# Patient Record
Sex: Male | Born: 1935 | Race: White | Hispanic: No | Smoking: Former smoker
Health system: Southern US, Community
[De-identification: ages and names within clinical notes are randomized; demographics above are authoritative.]

## PROBLEM LIST (undated history)

## (undated) DIAGNOSIS — C4490 Unspecified malignant neoplasm of skin, unspecified: Secondary | ICD-10-CM

## (undated) DIAGNOSIS — D689 Coagulation defect, unspecified: Secondary | ICD-10-CM

## (undated) DIAGNOSIS — F0390 Unspecified dementia without behavioral disturbance: Secondary | ICD-10-CM

## (undated) DIAGNOSIS — K219 Gastro-esophageal reflux disease without esophagitis: Secondary | ICD-10-CM

## (undated) DIAGNOSIS — H9190 Unspecified hearing loss, unspecified ear: Secondary | ICD-10-CM

## (undated) DIAGNOSIS — G20 Parkinson's disease: Secondary | ICD-10-CM

## (undated) DIAGNOSIS — Z5189 Encounter for other specified aftercare: Secondary | ICD-10-CM

## (undated) DIAGNOSIS — G20A1 Parkinson's disease without dyskinesia, without mention of fluctuations: Secondary | ICD-10-CM

## (undated) DIAGNOSIS — M1990 Unspecified osteoarthritis, unspecified site: Secondary | ICD-10-CM

## (undated) DIAGNOSIS — C801 Malignant (primary) neoplasm, unspecified: Secondary | ICD-10-CM

## (undated) DIAGNOSIS — IMO0001 Reserved for inherently not codable concepts without codable children: Secondary | ICD-10-CM

## (undated) DIAGNOSIS — I10 Essential (primary) hypertension: Secondary | ICD-10-CM

## (undated) DIAGNOSIS — I2510 Atherosclerotic heart disease of native coronary artery without angina pectoris: Secondary | ICD-10-CM

## (undated) DIAGNOSIS — J449 Chronic obstructive pulmonary disease, unspecified: Secondary | ICD-10-CM

## (undated) DIAGNOSIS — E7800 Pure hypercholesterolemia, unspecified: Secondary | ICD-10-CM

## (undated) DIAGNOSIS — N189 Chronic kidney disease, unspecified: Secondary | ICD-10-CM

## (undated) DIAGNOSIS — I209 Angina pectoris, unspecified: Secondary | ICD-10-CM

## (undated) DIAGNOSIS — I219 Acute myocardial infarction, unspecified: Secondary | ICD-10-CM

## (undated) HISTORY — PX: COLON SURGERY: SHX602

## (undated) HISTORY — PX: CARDIAC SURGERY: SHX584

## (undated) HISTORY — PX: CHOLECYSTECTOMY: SHX55

## (undated) HISTORY — PX: BACK SURGERY: SHX140

## (undated) HISTORY — PX: CORONARY ARTERY BYPASS GRAFT: SHX141

---

## 1997-10-02 ENCOUNTER — Ambulatory Visit (HOSPITAL_BASED_OUTPATIENT_CLINIC_OR_DEPARTMENT_OTHER): Admission: RE | Admit: 1997-10-02 | Discharge: 1997-10-02 | Payer: Self-pay | Admitting: Plastic Surgery

## 1997-12-11 ENCOUNTER — Ambulatory Visit (HOSPITAL_COMMUNITY): Admission: RE | Admit: 1997-12-11 | Discharge: 1997-12-11 | Payer: Self-pay | Admitting: Surgery

## 1998-05-07 ENCOUNTER — Ambulatory Visit (HOSPITAL_BASED_OUTPATIENT_CLINIC_OR_DEPARTMENT_OTHER): Admission: RE | Admit: 1998-05-07 | Discharge: 1998-05-07 | Payer: Self-pay | Admitting: Plastic Surgery

## 1998-05-11 ENCOUNTER — Ambulatory Visit (HOSPITAL_BASED_OUTPATIENT_CLINIC_OR_DEPARTMENT_OTHER): Admission: RE | Admit: 1998-05-11 | Discharge: 1998-05-11 | Payer: Self-pay | Admitting: Plastic Surgery

## 1998-08-07 ENCOUNTER — Encounter: Payer: Self-pay | Admitting: Cardiology

## 1998-08-07 ENCOUNTER — Ambulatory Visit (HOSPITAL_COMMUNITY): Admission: RE | Admit: 1998-08-07 | Discharge: 1998-08-07 | Payer: Self-pay | Admitting: Cardiology

## 2002-05-01 ENCOUNTER — Ambulatory Visit (HOSPITAL_COMMUNITY): Admission: RE | Admit: 2002-05-01 | Discharge: 2002-05-01 | Payer: Self-pay | Admitting: Orthopaedic Surgery

## 2002-05-01 ENCOUNTER — Encounter: Payer: Self-pay | Admitting: Orthopaedic Surgery

## 2002-05-20 ENCOUNTER — Encounter: Admission: RE | Admit: 2002-05-20 | Discharge: 2002-05-20 | Payer: Self-pay | Admitting: Orthopaedic Surgery

## 2002-05-20 ENCOUNTER — Encounter: Payer: Self-pay | Admitting: Orthopaedic Surgery

## 2002-06-03 ENCOUNTER — Encounter: Payer: Self-pay | Admitting: Orthopaedic Surgery

## 2002-06-03 ENCOUNTER — Encounter: Admission: RE | Admit: 2002-06-03 | Discharge: 2002-06-03 | Payer: Self-pay | Admitting: Orthopaedic Surgery

## 2002-06-17 ENCOUNTER — Encounter: Admission: RE | Admit: 2002-06-17 | Discharge: 2002-06-17 | Payer: Self-pay | Admitting: Orthopaedic Surgery

## 2002-06-17 ENCOUNTER — Encounter: Payer: Self-pay | Admitting: Orthopaedic Surgery

## 2003-01-30 ENCOUNTER — Encounter: Payer: Self-pay | Admitting: Radiology

## 2003-01-30 ENCOUNTER — Encounter: Payer: Self-pay | Admitting: Orthopaedic Surgery

## 2003-01-30 ENCOUNTER — Encounter: Admission: RE | Admit: 2003-01-30 | Discharge: 2003-01-30 | Payer: Self-pay | Admitting: Orthopaedic Surgery

## 2003-02-13 ENCOUNTER — Encounter: Payer: Self-pay | Admitting: Orthopaedic Surgery

## 2003-02-13 ENCOUNTER — Encounter: Admission: RE | Admit: 2003-02-13 | Discharge: 2003-02-13 | Payer: Self-pay | Admitting: Orthopaedic Surgery

## 2003-02-28 ENCOUNTER — Encounter: Payer: Self-pay | Admitting: Orthopaedic Surgery

## 2003-02-28 ENCOUNTER — Encounter: Admission: RE | Admit: 2003-02-28 | Discharge: 2003-02-28 | Payer: Self-pay | Admitting: Orthopaedic Surgery

## 2003-04-03 ENCOUNTER — Encounter: Payer: Self-pay | Admitting: Orthopaedic Surgery

## 2003-04-03 ENCOUNTER — Ambulatory Visit (HOSPITAL_COMMUNITY): Admission: RE | Admit: 2003-04-03 | Discharge: 2003-04-03 | Payer: Self-pay | Admitting: Orthopaedic Surgery

## 2003-04-26 ENCOUNTER — Ambulatory Visit (HOSPITAL_COMMUNITY): Admission: RE | Admit: 2003-04-26 | Discharge: 2003-04-26 | Payer: Self-pay | Admitting: *Deleted

## 2003-08-10 ENCOUNTER — Ambulatory Visit (HOSPITAL_COMMUNITY): Admission: RE | Admit: 2003-08-10 | Discharge: 2003-08-10 | Payer: Self-pay | Admitting: Neurosurgery

## 2003-08-31 ENCOUNTER — Inpatient Hospital Stay (HOSPITAL_COMMUNITY): Admission: RE | Admit: 2003-08-31 | Discharge: 2003-09-01 | Payer: Self-pay | Admitting: Neurosurgery

## 2003-09-03 ENCOUNTER — Emergency Department (HOSPITAL_COMMUNITY): Admission: EM | Admit: 2003-09-03 | Discharge: 2003-09-03 | Payer: Self-pay | Admitting: Emergency Medicine

## 2004-02-27 ENCOUNTER — Ambulatory Visit (HOSPITAL_COMMUNITY): Admission: RE | Admit: 2004-02-27 | Discharge: 2004-02-27 | Payer: Self-pay | Admitting: Cardiology

## 2004-03-19 ENCOUNTER — Ambulatory Visit (HOSPITAL_COMMUNITY): Admission: RE | Admit: 2004-03-19 | Discharge: 2004-03-20 | Payer: Self-pay | Admitting: Cardiology

## 2004-12-11 ENCOUNTER — Ambulatory Visit (HOSPITAL_COMMUNITY): Admission: RE | Admit: 2004-12-11 | Discharge: 2004-12-11 | Payer: Self-pay | Admitting: Neurosurgery

## 2005-01-17 ENCOUNTER — Inpatient Hospital Stay (HOSPITAL_COMMUNITY): Admission: RE | Admit: 2005-01-17 | Discharge: 2005-01-18 | Payer: Self-pay | Admitting: Neurosurgery

## 2005-05-06 ENCOUNTER — Ambulatory Visit (HOSPITAL_COMMUNITY): Admission: RE | Admit: 2005-05-06 | Discharge: 2005-05-06 | Payer: Self-pay | Admitting: Cardiology

## 2007-09-15 ENCOUNTER — Inpatient Hospital Stay (HOSPITAL_COMMUNITY): Admission: RE | Admit: 2007-09-15 | Discharge: 2007-09-16 | Payer: Self-pay | Admitting: *Deleted

## 2008-06-05 ENCOUNTER — Ambulatory Visit (HOSPITAL_COMMUNITY): Admission: RE | Admit: 2008-06-05 | Discharge: 2008-06-05 | Payer: Self-pay | Admitting: *Deleted

## 2008-06-05 ENCOUNTER — Encounter (INDEPENDENT_AMBULATORY_CARE_PROVIDER_SITE_OTHER): Payer: Self-pay | Admitting: *Deleted

## 2008-12-07 ENCOUNTER — Inpatient Hospital Stay (HOSPITAL_COMMUNITY): Admission: RE | Admit: 2008-12-07 | Discharge: 2008-12-08 | Payer: Self-pay | Admitting: Cardiology

## 2009-02-05 ENCOUNTER — Inpatient Hospital Stay (HOSPITAL_COMMUNITY): Admission: RE | Admit: 2009-02-05 | Discharge: 2009-02-07 | Payer: Self-pay | Admitting: Cardiology

## 2009-07-13 ENCOUNTER — Encounter: Admission: RE | Admit: 2009-07-13 | Discharge: 2009-07-13 | Payer: Self-pay | Admitting: Internal Medicine

## 2009-08-04 ENCOUNTER — Encounter: Admission: RE | Admit: 2009-08-04 | Discharge: 2009-08-04 | Payer: Self-pay | Admitting: *Deleted

## 2010-07-14 ENCOUNTER — Encounter: Payer: Self-pay | Admitting: Orthopedic Surgery

## 2010-09-28 LAB — GLUCOSE, CAPILLARY
Glucose-Capillary: 111 mg/dL — ABNORMAL HIGH (ref 70–99)
Glucose-Capillary: 147 mg/dL — ABNORMAL HIGH (ref 70–99)
Glucose-Capillary: 92 mg/dL (ref 70–99)
Glucose-Capillary: 98 mg/dL (ref 70–99)

## 2010-09-28 LAB — CARDIAC PANEL(CRET KIN+CKTOT+MB+TROPI)
CK, MB: 4.4 ng/mL — ABNORMAL HIGH (ref 0.3–4.0)
CK, MB: 8.6 ng/mL — ABNORMAL HIGH (ref 0.3–4.0)
Relative Index: INVALID (ref 0.0–2.5)
Total CK: 98 U/L (ref 7–232)
Troponin I: 1.92 ng/mL (ref 0.00–0.06)

## 2010-09-28 LAB — BASIC METABOLIC PANEL
GFR calc Af Amer: >60 mL/min (ref >60)
GFR calc non Af Amer: 55 mL/min — ABNORMAL LOW (ref >60)
Potassium: 4.4 mEq/L (ref 3.5–5.1)
Sodium: 141 mEq/L (ref 135–145)

## 2010-09-28 LAB — CBC
HCT: 38.5 % — ABNORMAL LOW (ref 39.0–52.0)
Platelets: 112 10*3/uL — ABNORMAL LOW (ref 150–400)
RDW: 14.1 % (ref 11.5–15.5)

## 2010-09-30 LAB — CBC
Hemoglobin: 12.1 g/dL — ABNORMAL LOW (ref 13.0–17.0)
MCHC: 33.9 g/dL (ref 30.0–36.0)
MCV: 94.8 fL (ref 78.0–100.0)
Platelets: 145 10*3/uL — ABNORMAL LOW (ref 150–400)
RBC: 3.75 MIL/uL — ABNORMAL LOW (ref 4.22–5.81)
RDW: 15 % (ref 11.5–15.5)
WBC: 7.9 10*3/uL (ref 4.0–10.5)

## 2010-09-30 LAB — URINALYSIS, ROUTINE W REFLEX MICROSCOPIC
Ketones, ur: NEGATIVE mg/dL
Nitrite: NEGATIVE
Specific Gravity, Urine: 1.02 (ref 1.005–1.030)
pH: 5.5 (ref 5.0–8.0)

## 2010-09-30 LAB — BASIC METABOLIC PANEL
CO2: 27 mEq/L (ref 19–32)
Chloride: 108 mEq/L (ref 96–112)
Chloride: 110 mEq/L (ref 96–112)
Creatinine, Ser: 1.23 mg/dL (ref 0.4–1.5)
Creatinine, Ser: 1.51 mg/dL — ABNORMAL HIGH (ref 0.4–1.5)
GFR calc Af Amer: 54 mL/min — ABNORMAL LOW (ref >60)
GFR calc Af Amer: >60 mL/min (ref >60)
Potassium: 4.5 mEq/L (ref 3.5–5.1)
Sodium: 140 mEq/L (ref 135–145)
Sodium: 140 mEq/L (ref 135–145)

## 2010-09-30 LAB — DIFFERENTIAL
Basophils Absolute: 0 10*3/uL (ref 0.0–0.1)
Lymphocytes Relative: 16 % (ref 12–46)
Neutro Abs: 5.9 10*3/uL (ref 1.7–7.7)
Neutrophils Relative %: 76 % (ref 43–77)

## 2010-09-30 LAB — GLUCOSE, CAPILLARY: Glucose-Capillary: 88 mg/dL (ref 70–99)

## 2010-09-30 LAB — CARDIAC PANEL(CRET KIN+CKTOT+MB+TROPI): Relative Index: INVALID (ref 0.0–2.5)

## 2010-10-07 ENCOUNTER — Emergency Department (INDEPENDENT_AMBULATORY_CARE_PROVIDER_SITE_OTHER): Payer: Medicare Other

## 2010-10-07 ENCOUNTER — Emergency Department (HOSPITAL_BASED_OUTPATIENT_CLINIC_OR_DEPARTMENT_OTHER)
Admission: EM | Admit: 2010-10-07 | Discharge: 2010-10-07 | Disposition: A | Discharge status: Home / Self Care | Payer: Medicare Other | Attending: Emergency Medicine | Admitting: Emergency Medicine

## 2010-10-07 DIAGNOSIS — J449 Chronic obstructive pulmonary disease, unspecified: Secondary | ICD-10-CM | POA: Insufficient documentation

## 2010-10-07 DIAGNOSIS — S4350XA Sprain of unspecified acromioclavicular joint, initial encounter: Secondary | ICD-10-CM | POA: Insufficient documentation

## 2010-10-07 DIAGNOSIS — K219 Gastro-esophageal reflux disease without esophagitis: Secondary | ICD-10-CM | POA: Insufficient documentation

## 2010-10-07 DIAGNOSIS — I10 Essential (primary) hypertension: Secondary | ICD-10-CM | POA: Insufficient documentation

## 2010-10-07 DIAGNOSIS — W06XXXA Fall from bed, initial encounter: Secondary | ICD-10-CM | POA: Insufficient documentation

## 2010-10-07 DIAGNOSIS — Z79899 Other long term (current) drug therapy: Secondary | ICD-10-CM | POA: Insufficient documentation

## 2010-10-07 DIAGNOSIS — E119 Type 2 diabetes mellitus without complications: Secondary | ICD-10-CM | POA: Insufficient documentation

## 2010-10-07 DIAGNOSIS — J4489 Other specified chronic obstructive pulmonary disease: Secondary | ICD-10-CM | POA: Insufficient documentation

## 2010-10-07 DIAGNOSIS — S43109A Unspecified dislocation of unspecified acromioclavicular joint, initial encounter: Secondary | ICD-10-CM

## 2010-10-07 DIAGNOSIS — Z951 Presence of aortocoronary bypass graft: Secondary | ICD-10-CM | POA: Insufficient documentation

## 2010-10-07 LAB — GLUCOSE, CAPILLARY: Glucose-Capillary: 186 mg/dL — ABNORMAL HIGH (ref 70–99)

## 2010-11-05 NOTE — Cardiovascular Report (Signed)
NAME:  Leon Sharp, Leon Sharp NO.:  [id number]   MEDICAL RECORD NO.:  [id number]          PATIENT TYPE:  INP   LOCATION:  6532                         FACILITY:  [facility]   PHYSICIAN:  Darlin Priestly, MD  DATE OF BIRTH:  [birth year]   DATE OF PROCEDURE:  09/15/2007  DATE OF DISCHARGE:                            CARDIAC CATHETERIZATION   PROCEDURE:  1. SVG to diagonal.  2. Percutaneous transluminal coronary angioplasty.  3. Placement of intracoronary stent.  4. SVG to RCA.  5. Placement of intracoronary stent.   ATTENDING PHYSICIAN:  Darlin Priestly, MD   COMPLICATIONS:  None.   INDICATIONS:  Leon Sharp is an 77 year old male patient of Dr. Charolette Child with a history of coronary artery bypass surgery x2 with  multiple percutaneous interventions since that time.  He recently  underwent cardiac catheterization by Dr. Aleen Campi on September 13, 2007,  after the patient had an abnormal stress test, complained of increasing  chest pain.  Cardiac catheterization revealed significant lesion in the  vein graft to the diagonal as well as a napkin ring lesion in the vein  graft to the RCA.  He is now referred for percutaneous intervention of  both vein grafts.   DESCRIPTION OF OPERATION:  After giving informed consent the patient was  brought to the cardiac cath lab where the right groin was shaved,  prepped and draped in the usual sterile fashion.  Routine monitoring was  established.  Using modified Seldinger technique a #6 arterial sheath  was inserted in the left femoral artery.  Next a #6 French LCB guiding  catheter with side holes was then coaxially engaged from the vein graft  to the diagonal.  Following this a 0.014 ATW marker wire was advanced  down the guiding catheter and used to cross the 99% mid graft stenotic  lesion and positioned distal diagonal without difficulty.  Following  this a Firestar 2.5 x 15 mm balloon was then used to cross the stenotic  lesion.   One inflation to 8 atmospheres was performed for a total of 14  seconds.  Followup angiogram revealed good luminal gain.  This balloon  was then removed and a Cypher 2.5 x 18 mm stent was then positioned  across the stenotic lesion.  The stent was then deployed to 16  atmospheres for a total of 18 seconds.  This stent balloon was removed  and a Durastar 2.75 x 10 mm balloon was then used to post dilatate the  stent.  Three overlapping inflations to a maximum of 18 atmospheres for  a total of approximately 40 seconds was performed.  Followup angiogram  revealed no evidence of dissection or thrombus with TIMI III flow in  this vessel.  The balloon and wires were then removed and this guide was  exchanged for an RCB guiding catheter with side holes.  The same 0.014  ATW marker wire was advanced down the guiding catheter and used to cross  the mid vein graft stenotic lesion and positioned in the distal PDA  without difficulty.  Over this a Cypher 3.5 x 23 mm stent  was then used  to cross the stenotic lesion and positioned in the distal portion of the  previously placed stent.  This stent was deployed to 12 atmospheres for  a total of 13 seconds.  One overlapping inflation was then performed for  12 atmospheres for a total of 17 seconds.  Followup angiogram revealed  good luminal gain with no evidence of dissection or thrombus.  The stent  balloon was removed and a Durastar 3.5 x 15 mm balloon was then  positioned in the distal portion of the stent.  Two overlapping  inflations to a maximum of 16 atmospheres were performed for a total of  26 seconds.  Followup angiogram revealed no evidence of dissection or  thrombus with TIMI III flow to the distal vessel.  IV Angiomax was used  throughout the case.   Final orthogonal angiograms revealed less than 10% residual stenosis in  both the vein graft to the diagonal as well as the vein graft to the  RCA.  At this point we elected to conclude the  procedure.  All balloons  and wires and catheters were removed.  Hemostatic sheath was sewn in  place and the patient was transferred back to the ward in stable  condition.   CONCLUSIONS:  1. Successful percutaneous transluminal coronary balloon angioplasty      and placement of a Cypher 2.5 x 18 mm stent ultimately post dilated      to 2.75 mm in the saphenous vein graft to the diagonal.  2. Successful placement of a Cypher 3.5 x 23 mm stent in the mid vein      graft to the RCA.  3. Adjuvant use of Angiomax infusion.      Darlin Priestly, MD  Electronically Signed     RHM/MEDQ  D:  09/15/2007  T:  09/15/2007  Job:  [id number]   cc:   Antionette Char, MD

## 2010-11-05 NOTE — Discharge Summary (Signed)
NAME:  Leon Sharp, Leon Sharp NO.:  [id number]   MEDICAL RECORD NO.:  [id number]          PATIENT TYPE:  INP   LOCATION:  6532                         FACILITY:  [facility]   PHYSICIAN:  Darlin Priestly, MD  DATE OF BIRTH:  [birth year]   DATE OF ADMISSION:  09/15/2007  DATE OF DISCHARGE:  09/16/2007                               DISCHARGE SUMMARY   DISCHARGE DIAGNOSES:  1. Unstable angina.  2. Abnormal stress test.  3. Coronary artery disease with history of bypass graft and an      allograft dysfunction with cardiac cath at [facility].  4. Percutaneous transluminal coronary angioplasty and Cypher stent      with saphenous vein graft to the diagonal, percutaneous      transluminal coronary angioplasty and Cypher stent with saphenous      vein graft to the right coronary artery for stenosis causing this      unstable angina.  He did have a mild bump in troponin, felt just to      be related to the procedure.  5. Hypertension, controlled.  6. Hyperlipidemia.  7. Non-insulin dependent diabetes mellitus.  8. Restless leg syndrome.  9. Benign prostatic hypertrophy.  10.Wheezes this morning, has not used inhaler.   DISCHARGE CONDITION:  Stable and improved without angina.   PROCEDURES:  On September 16, 2007, PTCA and stent deployment of Cypher  stent with saphenous vein graft to the diagonal and PTCA with Cypher  stent deployment with saphenous vein graft to the RCA by Dr. Lenise Herald.   DISCHARGE MEDICATIONS:  1. Clonazepam 0.5 mg daily.  2. Zetia 10 mg daily.  3. Amaryl 2 mg daily.  4. Actos 15 mg daily.  5. Zebeta 7.5 mg daily.  6. Flomax 0.4 mg daily.  7. Hydrochlorothiazide 25 daily.  8. Tramadol 50 mg daily.  9. Tylenol as needed every 6 hours.  10.Aspirin 81 mg daily, take 2 daily.  11.Plavix 75 mg daily.  12.Celebrex 200 mg daily.  13.Zantac 150 mg twice a day.  14.Micardis 20 half a tablet daily.  15.Nitro patch was discontinued.  16.B12  injections monthly  before Mucomyst for 1 day.   DISCHARGE INSTRUCTIONS:  1. Low-sodium heart-healthy diabetic diet.  2. Increase activity slowly.  3. May shower or bathe.  4. No lifting for 2 days.  5. No driving for 2 days.  6. Wash left groin cath site with soap and water.  Call us if any      bleeding, swelling, or any drainage.  7. Follow up with Dr. Lucas Mallow as previously instructed for next week.   HISTORY OF PRESENT ILLNESS:  An 77 year old patient of Dr. Aleen Campi with  history of coronary disease and bypass surgery x2 in the past, multiple  interventions since that time.  He had had some recent unstable angina  and had an abnormal stress test, and underwent cardiac cath by Dr.  Aleen Campi at [facility] on September 13, 2007.  He had significant  lesion at the vein graft to diagonal as well as to the RCA and he was  brought in on  September 15, 2007, for intervention by Dr. Jenne Campus to these  areas.   Past medical history, family history, social history, review of systems;  see H and P.   PHYSICAL EXAMINATION AT DISCHARGE:  VITAL SIGNS:  Blood pressure 102/41,  pulse 70, respiratory rate 16, temperature 97, and oxygen saturation  97%.  HEART:  Regular rate and rhythm. S1 and S2.  LUNGS:  Had some wheezes this morning.  ABDOMEN:  Positive bowel sounds.  EXTREMITIES:  No edema.  Left groin cath site was stable.  No hematoma.   LABORATORY DATA:  Hemoglobin 12.9, hematocrit 37.7, platelets were  a  little low at 125, and WBC 5.7.  Sodium 134, potassium 3.9, BUN 28,  creatinine 1.46, and glucose 112.   Heart as stated.  EKG:  Post procedure sinus rhythm without any acute  changes.   HOSPITAL COURSE:  The patient was brought in and underwent PTCA and  stent with saphenous vein graft to the diagonal  RCA.  Tolerated the  procedures well.  By the next morning, he was ambulating without  problem.  He did well with cardiac rehab.  Vitals were stable.  He did  have wheezes but had had  not used his Spiriva, so that was ordered for  him, and he will use that at home as well.  His glucose was stable with  diabetes.  He will follow up with Dr. Lucas Mallow next week as previously  instructed.  They may need to check his platelets at that time as well  as his creatinine.      Darcella Gasman. Valarie Merino      Darlin Priestly, MD  Electronically Signed   LRI/MEDQ  D:  09/16/2007  T:  09/17/2007  Job:  [id number]   cc:   Jaclyn Prime. Lucas Mallow, M.D.  Antionette Char, MD

## 2010-11-05 NOTE — Discharge Summary (Signed)
NAME:  Leon Sharp, Leon Sharp NO.:  [id number]   MEDICAL RECORD NO.:  [id number]          PATIENT TYPE:  INP   LOCATION:  2503                         FACILITY:  [facility]   PHYSICIAN:  Thereasa Solo. Little, M.D. DATE OF BIRTH:  [birth year]   DATE OF ADMISSION:  12/07/2008  DATE OF DISCHARGE:  12/08/2008                               DISCHARGE SUMMARY   DISCHARGE DIAGNOSES:  1. Unstable angina.  2. Coronary artery disease with history of myocardial infarctions in      the past, coronary artery bypass graft, coronary artery bypass      grafting in the past and redo bypass grafting.      a.     Percutaneous coronary intervention and stent deployment to       the native obtuse marginal (coronary artery) with 80% in-stent       stenosis with a XIENCE stent placed, drug eluting.      b.     Significant lesions in the saphenous vein graft to the       diagonal, which will need to be addressed in the next 3-4 weeks       per Dr. Aleen Campi.  3. Diabetes mellitus type 2.  4. Hypertension.  5. Benign prostatic hypertrophy.  6. Hypercholesterolemia.  7. Restless leg.  8. Osteoarthritis.  9. Gastroesophageal reflux disease.  10.History of back surgeries.   DISCHARGE CONDITION:  Improved.   PROCEDURES:  Combined left heart cath, December 07, 2008, by Dr. Julieanne Manson.   December 07, 2008, PTCA and stent deployment to native OM 80% in-stent  restenosis.   DISCHARGE MEDICATIONS:  1. Celebrex 200 mg daily, we did hold in the hospital secondary to      renal insufficiency.  2. Ranitidine 150 mg daily.  3. Zetia 10 mg daily.  4. Plavix 75 mg daily.  5. Glimepiride 2 mg daily.  6. Hydrochlorothiazide 25 mg daily.  7. Bisoprolol 5 mg daily.  8. Spiriva 18 mcg daily.  9. Aspirin, we increased to 2 aspirin daily of 81 mg.  10.Fish oil 1200 mg daily.  11.Nitroglycerin 0.4 mg sublingual p.r.n.  12.Nitro-Dur patch 0.8 mg daily.  13.Micardis 20 mg daily.  14.Actos 15 mg daily.  15.Flomax 0.4  mg daily.  16.The patient had been on Mucomyst as an outpatient the day before      procedure, he has received here in the hospital and he can stop it.  17.Do not stop Plavix due to new stent.   DISCHARGE INSTRUCTIONS:  Low-sodium, heart-healthy, diabetic diet.  Wash  cath site with soap and water.  Call if any bleeding, swelling, or  drainage.  Increase activity slowly.  May shower.  No lifting for 2  days.  No driving for 2 days.   FOLLOWUP:  1. Followup with Dr. Aleen Campi in 2 weeks.  Call the office for an      appointment.  Dr. Adelene Idler office was closed at the time of      discharge.  2. Discussed with Dr. Aleen Campi the timing for further stenting.  3. Have blood work done Tuesday, December 12, 2008, to check kidney      function.   HOSPITAL COURSE:  An 77 year old gentleman, primary care of Dr. Renford Dills, cardiology patient of Dr. Aleen Campi was seen by Dr. Aleen Campi in  the office secondary to increasing angina.  Nitroglycerin was helping,  but he was having more and more frequent episodes and was awakened at 2  a.m. needing nitroglycerin.  He was seen and brought in to [facility], electively underwent cardiac catheterization with Dr. Clarene Duke  with results as stated, and please see dictated cardiac cath report.  By  the next morning, he was stable, had no complaints, and was ready for  discharge home.  He does have renal insufficiency.  His Celebrex was  held and he was given Mucomyst as well.  We will check an outpatient  labs to ensure his kidney function is stabilized and he will follow up  with Dr. Aleen Campi.      Darcella Gasman. [place], N.P.    ______________________________  Thereasa Solo Little, M.D.    LRI/MEDQ  D:  12/08/2008  T:  12/09/2008  Job:  [id number]   cc:   Deirdre Peer. Polite, M.D.  Antionette Char, MD

## 2010-11-05 NOTE — Cardiovascular Report (Signed)
NAME:  Leon Sharp, Leon Sharp NO.:  [id number]   MEDICAL RECORD NO.:  [id number]          PATIENT TYPE:  INP   LOCATION:  2503                         FACILITY:  [facility]   PHYSICIAN:  Thereasa Solo. Little, M.D. DATE OF BIRTH:  [birth year]   DATE OF PROCEDURE:  02/05/2009  DATE OF DISCHARGE:                            CARDIAC CATHETERIZATION   INDICATIONS FOR TEST:  This 77 year old male has known coronary disease  with previous bypass surgery and stenting in March 2009 to the saphenous  vein graft to his right and the saphenous vein graft to the diagonal.  He had recurrent chest pain, had repeat cath on December 07, 2008, and had a  complex intervention to his native circumflex.  In addition to that, he  had moderate stenosis in the saphenous vein graft to the diagonal.  Because of the complexity of the circumflex system, a decision was made  to bring him back for intervention to the LAD to the diagonal graft.   SURGEON:  Thereasa Solo. Little, MD   PROCEDURE IN DETAIL:  After obtaining informed consent, the patient was  prepped and draped in the usual sterile fashion exposing the right  groin.  Following local anesthetic with 1% Xylocaine, the Seldinger  technique was employed and a 6-French introducer sheath was placed in  the right femoral artery.  Evaluation of the native circumflex system  was performed and revealed the circumflex to be widely patent.  The  proximal stent was widely patent.  The mid circumflex stent had 20-30%  minor narrowing in it and the distal vessel was free of disease.   Attention was then directed to the saphenous vein graft to the diagonal.  A JR-4 with side hole guide catheter was used and a short Luge wire.  Evaluation of this graft revealed the proximal graft had a 50-60% area  of narrowing.  This was followed by a 12-15 mm segment of what appeared  to be pretty normal graft and then a stent that was previously placed in  the midportion of the graft.   In this stent was an area of 60% haziness.  Just distal to the stent was another area of an eccentric 70% narrowing  with the diagonal system itself being free of disease.   The patient was given IV Angiomax, had an appropriate ACT and at that  point, a short Luge wire was placed down the vein graft into the distal  diagonal.   1. A 2.5 x 10 cutting balloon was placed in the area of restenosis      within the stent and two inflations 8 x 41 and 9 x 40 seconds were      performed.  2. A Xience 2.5 x 12 stent was placed in such a manner that it      overlapped the distal portion of the previous stent and covered the      distal area, 2 atmospheres 12 x 42 and 12 x 29 were performed.  3. A Xience 2.5 x 23 stent was placed into the vessel, but would not      cover the  areas and was removed and never deployed.  4. An Endeavor 2.5 x 30 mm long stent was placed in the proximal area      of narrowing and extended down into and overlapped the original      stent.  Two inflations 12 x 39 and 12 x 40 were performed.  5. Post-dilatation was accomplished with a 2.75 x 12 [place] Quantum      balloon.  A total of 5 inflations, 12, 13, and 14 atmospheres for      around 30 seconds were performed.  The entire stented area was      postdilated with a special attention being taken to make sure that      the overlapped areas were well postdilated.   The vessel appeared to be normal post-stenting.  There was no evidence  of any dissection, thrombus formation, or distal embolization.  The  Angiomax was discontinued.  The patient should be ready for discharge in  the morning.  He is already on chronic Plavix therapy and he will need  to continue his Plavix for about 24 additional months.   Total contrast for the procedure was 150 mL.  His blood pressure was  130/49.  His heart rate was 63.           ______________________________  Thereasa Solo. Little, M.D.     ABL/MEDQ  D:  02/05/2009  T:  02/05/2009   Job:  [id number]   cc:   Antionette Char, MD  Deirdre Peer. Polite, M.D.  Catheterization Laboratory

## 2010-11-05 NOTE — Op Note (Signed)
NAME:  Leon Sharp, Leon Sharp NO.:  [id number]   MEDICAL RECORD NO.:  [id number]          PATIENT TYPE:  AMB   LOCATION:  ENDO                         FACILITY:  [facility]   PHYSICIAN:  Georgiana Spinner, M.D.    DATE OF BIRTH:  1921-11-02   DATE OF PROCEDURE:  DATE OF DISCHARGE:                               OPERATIVE REPORT   PROCEDURE:  Colonoscopy.   INDICATIONS:  Colon cancer and colon polyps.   ANESTHESIA:  Fentanyl 12.5 mcg and Versed 2 mg.   PROCEDURE:  The patient mildly sedated in the left lateral decubitus  position.  Perineal exam appeared normal.  Subsequently the Pentax  videoscopic colonoscope was inserted in the rectum, and we encountered  two black areas, which appeared to be suture material, which I  photographed only.  We subsequently advanced the colonoscope under  direct vision to the cecum identified by ileocecal valve and appendiceal  orifice, both of which were photographed.  From this point, the  colonoscope was slowly withdrawn taking circumferential view of colonic  mucosa.  As we withdrew all the way to the rectum, stopping in the  ascending colon where a polyp was seen, photographed and removed using  snare cautery technique, setting of 20/150 blended current.  We next  stopped at the hepatic flexure, where a second polyp, similar in  appearance.  Both slightly less than 1 cm in size and the second one was  removed in the same manner with snare cautery technique.  Both polyps  were retrieved by suction of the tissue through the endoscope to a  tissue trap.  The endoscope was then noted withdrawn all the way to the  rectum which appeared normal other than the suture material on direct  view.  I could not get the endoscope into retroflexed view.  So it was  elected to just withdraw the endoscope.  The patient's vital signs also  remained stable.  The patient tolerated the procedure well without any  apparent complication.   FINDINGS:  1. Suture  material in rectum.  2. Polyps as described above in the ascending colon and hepatic      flexure area.  Await biopsy reports.  The patient will call me with      results and follow up with me as an outpatient.           ______________________________  Georgiana Spinner, M.D.     GMO/MEDQ  D:  06/05/2008  T:  06/06/2008  Job:  [id number]   cc:   Deirdre Peer. Polite, M.D.

## 2010-11-05 NOTE — Cardiovascular Report (Signed)
NAME:  Leon Sharp, Leon Sharp NO.:  [id number]   MEDICAL RECORD NO.:  [id number]          PATIENT TYPE:  INP   LOCATION:  2503                         FACILITY:  [facility]   PHYSICIAN:  Thereasa Solo. Little, M.D. DATE OF BIRTH:  [birth year]   DATE OF PROCEDURE:  12/07/2008  DATE OF DISCHARGE:                            CARDIAC CATHETERIZATION   This 77 year old male has had bypass surgery on 2 previous occasions.  He had had stenting in March 2009 to the saphenous vein graft to his  right and to the saphenous vein graft to his diagonal system.  He has  had recurrent chest pain, required multiple nitroglycerins, he is not  having chest pain at rest.  He is brought in for outpatient cardiac  catheterization.   After obtaining informed consent, the patient was prepped and draped in  the usual sterile fashion exposing the right groin.  Following local  anesthetic with 1% Xylocaine, the Seldinger technique was employed, and  a 5-French introducer sheath was placed in the right femoral artery.  Left and right coronary arteriography and graft visualization was  performed.  Because of the contrast load, I did not feel comfortable  doing a ventriculogram or distal aortogram.   COMPLICATIONS:  None.   TOTAL CONTRAST:  175 mL.   RESULTS:  Hemodynamic monitoring revealed a central aortic pressure of  119/55 and a heart rate of 57 sinus.   CORONARY ARTERIOGRAPHY:  1. Saphenous vein graft to the diagonal.  This graft was relatively      small in diameter about 2.5 mm.  In the midportion of this graft      was a 2.5 x 18 Cypher stent that had been placed in March 2009.      There was in-stent restenosis of approximately 60% and on both the      proximal and distal edges of the stent, but outside the stent      itself for additional 60% areas of narrowing.  2. Saphenous vein graft to the RCA.  The graft was a large 3.5+ mm      vessel.  There was a stent in the midportion of the graft  with      minor 30% narrowing in the distal edge of the stent.  The PDA and      the posterolateral vessels that it supplies were all widely patent.  3. Internal mammary artery to the LAD.  This graft was widely patent.      The LAD, however, had an area of 40-50% narrowing in the midportion      and a distal area where the vessel was about 1.5 mm in diameter      just above the apex, was 70% narrowed.  4. Native vessels:      a.     Left main normal.      b.     LAD.  This vessel was 100% occluded proximally.      c.     Right coronary artery 100% occluded proximally.      d.     Circumflex.  The circumflex was  a large vessel.  It was non-       grafted.  There was a long series of overlapping stents.  As best       I can tell, these were 3 Cypher stents.  In the midportion of the       stents, was an area of 80% maximum narrowing with about a 15-20 mm       area of length within the stents of narrowing.  The proximal       portion of the vessel just before the stent was narrowed.   PCI to the native OM was undertaken.  This system was upgraded to 6-  [place] and a short Luge wire was used.  Multiple guide catheters  including a JL4 and a CLS 3.5 were used.  Finally a 6-French CLS 4 was  used and it was the best guide catheter.  The Luge wire was placed down  the circumflex marginal system.  Attempts at passing a 3.25 x 15 cutter  were unsuccessful.  I was finally able to pass a 3.25 x 10 cutting  balloon.  Once this was in the stent itself, a series of 6 inflations  were performed throughout the region of obstruction.  The most  aggressive inflation was 14 atmospheres for 55 seconds.  The area that  had been 80% pre-intervention, now was less than 20% narrowed.  TIMI 2  flow was present before intervention, TIMI 3 flow was present post  intervention.  It was difficult for the guidewire to be placed on the  circumflex system.  At the end of the procedure, the proximal portion  that was  nonstented in the OM system appeared to be slightly hazy.  I  could not see a discrete dissection plane, but this was different from  preintervention.  Because of this, I placed a 3.0 x 12 XIENCE stent in  the proximal portion in such a manner that it overlapped the previously  placed stents.  This stent was recorded at 14 atmospheres for 40 seconds  with a final inflation being 13 atmospheres for 31 seconds.   Postdilatation was accomplished with 3.25 x 8 Quantum balloon with a  single inflation of 16 atmospheres for 32 seconds with care being taken  that it covered the overlapped area.  This area appeared to be normal  post stenting.   The patient was given Angiomax during the procedure.  There was brisk  TIMI 3 flow post procedure.  There was no evidence of any vasculitis or  thrombus.   A ventriculogram and a distal aortogram was not performed.  The patient  has aortoiliac disease and will need a distal aortogram in the near  future.   There are significant lesions in the saphenous vein graft to the  diagonal.  This will need to be addressed in the next 3-4 weeks, but  because of contrast media used today, I did not feel that intervening at  this point was in the patient's best interest.  These are not critical  lesions and at his age, I would prefer him giving over this intervention  before he was brought back for elective intervention to the saphenous  vein graft to the diagonal.  Of note, this may be able to be addressed  with one very long stent rather than 3 smaller stents.           ______________________________  Thereasa Solo. Little, M.D.     ABL/MEDQ  D:  12/07/2008  T:  12/08/2008  Job:  [id number]   cc:   Antionette Char, MD  Cath Lab

## 2010-11-05 NOTE — Discharge Summary (Signed)
NAME:  Leon Sharp, Leon Sharp NO.:  [id number]   MEDICAL RECORD NO.:  [id number]          PATIENT TYPE:  INP   LOCATION:  2503                         FACILITY:  [facility]   PHYSICIAN:  Thereasa Solo. Little, M.D. DATE OF BIRTH:  16-Sep-1921   DATE OF ADMISSION:  02/05/2009  DATE OF DISCHARGE:  02/07/2009                               DISCHARGE SUMMARY   DISCHARGE DIAGNOSES:  1. Coronary artery disease with bypass graft in 1972 and redo in 1996.      Multiple percutaneous coronary intervention stents.      a.     Known stenosis to the saphenous vein graft to the diagonal       undergoing cutting balloon angioplasty to the diagonal and       placement of a XIENCE stent as well as Endeavor stent.  2. Non-ST elevation myocardial infarction secondary to complex      procedure, stable.  3. Diabetes mellitus type 2, stable.  4. Hypertension.  5. Mild renal insufficiency.  6. Chronic lower extremity edema.  7. History of chronic obstructive pulmonary disease with wheezes.  8. History of benign prostatic hypertrophy.  9. Restless leg syndrome.  10.History of gastroesophageal reflux disease.  11.History of osteoarthritis.  12.STATIN intolerance, currently on Zetia.   DISCHARGE CONDITION:  Improved.   PROCEDURES:  February 05, 2009, percutaneous transluminal coronary  angioplasty and stent deployments and cutting balloon angioplasty to the  diagonal.  See Dr. Fredirick Maudlin dictated note.   DISCHARGE MEDICATIONS:  1. B12 injections monthly, he was doing this previously.  2. Xopenex inhaler 2 puffs as needed, new prescription.  3. Aspirin was increased to 325 mg 1 daily.  4. Actos 15 mg 1 daily.  5. Amaryl 2 mg 1 tablet by mouth daily.  6. Bisoprolol 5 mg 1-1/2 tablets daily.  7. Celebrex 1 tablet daily.  8. Flomax 0.4 mg 1 daily.  9. Hydrochlorothiazide 25 mg 1 daily.  10.Micardis 20 mg 1 daily.  11.Nitroglycerin sublingual 1 tablet under your tongue every 5 minutes      as needed up  to 3 p.r.n. chest pain.  12.Plavix 75 mg daily.  13.Zantac 150 mg 1 tablet by mouth daily.  14.Spiriva 18 mcg inhaler 1 inhalation daily.  15.Valium 5 mg 1 at bedtime as needed.  16.Zetia 10 mg daily.  17.Stop nitroglycerin patch.   HOSPITAL COURSE:  The patient was brought in electively for known  disease of the diagonal system.  In June, he underwent complex  circumflex intervention.  The patient has already had bypass grafting  and redo bypass grafting.   The patient underwent stents deployed to his diag of his LAD, he  tolerated the procedure well though he did have an enzyme bump  postprocedure.  He was kept an extra 24 hours in the hospital secondary  to this factor.   Also on the day postprocedure, the patient had wheezes with exertion.  He was given Xopenex nebulizers, which improved his wheezes.  By the  next morning, wheezes were resolved.  No problems with ambulation.  His  troponin was trending downward.  Dr. Clarene Duke  saw him and felt he was  ready for discharge home.  We did add Xopenex, a multidose inhaler to  his medical regimen.   LABORATORY DATA:  Hemoglobin 13, hematocrit 38.5, WBC 4.8, and platelets  112.  Sodium 141, potassium 4.4, chloride 109, CO2 of 27, glucose 104,  BUN 25, creatinine 1.26, and calcium 8.7.   Cardiac markers postprocedure; CK 100 with an MB of 9.1, troponin 1.78.  Second CK 98 with an MB of 8.6 and troponin 1.92.  On the morning of  discharge, CK 81, MB 4.4, and troponin 1.11.   EKGs revealed no acute changes postprocedure.   The patient tolerated the procedure and was stable at discharge.  He  will follow up with his primary care physician, Dr. Adelene Idler PA,  [facility], on February 28, 2009, at 10:30 in the morning.   OTHER DISCHARGE INSTRUCTIONS:  1. May walk up steps.  May shower.  No lifting for 1 week.  No driving      for 1 week.  2. Low-sodium heart-healthy diabetic diet.  3. Wash right cath site with soap and water.  Call  if any bleeding,      swelling, or drainage.      Darcella Gasman. Ingold, N.P.    ______________________________  Thereasa Solo Little, M.D.    LRI/MEDQ  D:  02/07/2009  T:  02/08/2009  Job:  [id number]   cc:   Antionette Char, MD  Deirdre Peer. Polite, M.D.

## 2010-11-05 NOTE — Op Note (Signed)
NAME:  Leon, Sharp NO.:  [id number]   MEDICAL RECORD NO.:  [id number]          PATIENT TYPE:  AMB   LOCATION:  ENDO                         [facility]ACILITY:  [facility]   PHYSICIAN:  Georgiana Spinner, M.D.    DATE OF BIRTH:  1922/02/21   DATE OF PROCEDURE:  06/05/2008  DATE OF DISCHARGE:                               OPERATIVE REPORT   PROCEDURE:  Upper endoscopy.   INDICATIONS:  Gastroesophageal reflux disease.   ANESTHESIA:  Fentanyl 37.5 mcg, Versed 3 mg.   PROCEDURE:  With the patient mildly sedated in the left lateral  decubitus position, the Pentax videoscopic endoscope was inserted into  the mouth and passed under direct vision through the esophagus -- which  appeared normal into the stomach.  Fundus, body, antrum, duodenal bulb  and second portion of the duodenum appeared normal.  From this point the  endoscope was slowly withdrawn, taking circumferential views of the  duodenal mucosa; until the endoscope had been pulled back into the  stomach and placed in retroflexion to view the stomach from below.  The  endoscope was straightened and withdrawn, taking circumferential views  of the remaining gastric and esophageal mucosa.  The patient's vital  signs, pulse oximetry remained stable.  The patient tolerated the  procedure well without apparent complications.   FINDINGS:  Negative endoscopic examination.   PLAN:  Proceed to colonoscopy           ______________________________  Georgiana Spinner, M.D.     GMO/MEDQ  D:  06/05/2008  T:  06/06/2008  Job:  [id number]   cc:   Deirdre Peer. Polite, M.D.

## 2010-11-08 NOTE — Op Note (Signed)
NAME:  Leon Sharp, Leon Sharp                       ACCOUNT NO.:  [id number]   MEDICAL RECORD NO.:  [id number]                   PATIENT TYPE:  INP   LOCATION:  3014                                 FACILITY:  [facility]   PHYSICIAN:  Danae Orleans. Venetia Maxon, M.D.               DATE OF BIRTH:  1922/06/23   DATE OF PROCEDURE:  08/31/2003  DATE OF DISCHARGE:  09/01/2003                                 OPERATIVE REPORT   PREOPERATIVE DIAGNOSIS:  L2-3 left foraminal stenosis, herniated disk,  spondylosis, degenerative disk disease, and radiculopathy.   POSTOPERATIVE DIAGNOSIS:  L2-3 left foraminal stenosis, herniated disk,  spondylosis, degenerative disk disease, and radiculopathy.   PROCEDURE:  Left L2-3 laminoforaminotomy with microdissection.   SURGEON:  Danae Orleans. Venetia Maxon, M.D.   ASSISTANT:  Cristi Loron, M.D.   ANESTHESIA:  General endotracheal anesthesia.   ESTIMATED BLOOD LOSS:  Minimal.   COMPLICATIONS:  None.   DISPOSITION:  Recovery.   INDICATIONS:  Hurshel Bouillon is a 77 year old man with severe left lower  extremity pain.  He has a significant multilevel spinal stenosis and  foraminal stenosis, but this is most marked at the L2-3 level on the left.  He also has epidural lipomatosis.  It was elected to take him to surgery for  decompression on the left at the L2-3 level.   DESCRIPTION OF PROCEDURE:  Mr. Schipani was brought to the operating room.  Following the satisfactory and uncomplicated induction of general  endotracheal anesthesia and placement of intravenous lines, the patient was  placed in the prone position on the Wilson frame.  His low back was then  prepped and draped in the usual sterile fashion.  A spinal needle was placed  in his back to mark in an effort to localize the L2-3 level.  An  intraoperative x-ray demonstrated the needle at the L3 spinous process.  Subsequently the area of planned incision was infiltrated with 0.25%  Marcaine and 0.5% lidocaine and  1:200,000 epinephrine.  An incision was made  overlying the L2-3 interspace, carried through to the lumbar dorsal fascia  on the left side of midline and subperiosteal dissection was performed,  exposing the L2-3 level.  Intraoperative x-ray confirmed marker probe at the  L2-3 level.  A hemisemilaminectomy of L2 was then performed, and this was  very carefully taken laterally to decompress the very tightly stenosed  lateral recess at this level.  The ligamentum flavum was detached and  removed in a piecemeal fashion, and the laminotomy was extended over the L3  nerve root as it extended out the neural foramen.  This was done with  microscopic visualization and the L3 nerve root was mobilized medially.  The  spinal canal was palpated.  There was a significant spondylitic ridge at the  L2-3 level, and it was felt that this would not be well-decompressed with  diskectomy and that the nerve root and thecal sac were well-decompressed  with the laminoforaminotomy.  Both Dr. Lovell Sheehan and I consulted regarding  this, and it was elected at this point not to perform a diskectomy.  It was  felt that the nerve root was sufficiently well-decompressed.  The wound was  copiously irrigated with bacitracin and saline and then bathed in 80 mg of  Depo-Medrol and 2 mL of fentanyl.  The self-retaining retractor was removed  and the microscope was taken out of the field.  The lumbar dorsal fascia was  closed with 0 Vicryl sutures, the subcutaneous tissue was reapproximated  with 2-0 Vicryl interrupted, inverted sutures, and the skin edges were  reapproximated with interrupted 3-0 Vicryl subcuticular stitch.  The wound  was dressed with Dermabond.  The patient was extubated in the operating room  and taken to the recovery room in stable and satisfactory condition, having  tolerated his operation well.  All counts were correct at the end of the  case.                                               Danae Orleans.  Venetia Maxon, M.D.    JDS/MEDQ  D:  08/31/2003  T:  09/02/2003  Job:  [id number]

## 2010-11-08 NOTE — Op Note (Signed)
NAME:  [name], [name] NO.:  [id number]   MEDICAL RECORD NO.:  [id number]          PATIENT TYPE:  INP   LOCATION:  3005                         FACILITY:  [facility]   PHYSICIAN:  Danae Orleans. Venetia Maxon, M.D.  DATE OF BIRTH:  [birth year]   DATE OF PROCEDURE:  01/17/2005  DATE OF DISCHARGE:                                 OPERATIVE REPORT   PREOPERATIVE DIAGNOSIS:  Foraminal stenosis with spondylosis, degenerative  disc disease, and radiculopathy, L3-L4 and L4-L5 level, right.   POSTOPERATIVE DIAGNOSIS:  Foraminal stenosis with spondylosis, degenerative  disc disease, and radiculopathy, L3-L4 and L4-L5 level, right.   PROCEDURE:  Right L3-L4 and L4-L5 laminoforaminotomies with microdissection.   SURGEON:  Danae Orleans. Venetia Maxon, M.D.   ASSISTANT:  Clydene Fake, M.D.   ANESTHESIA:  General endotracheal anesthesia.   ESTIMATED BLOOD LOSS:  Minimal.   COMPLICATIONS:  None.   DISPOSITION:  Recovery room.   INDICATIONS FOR PROCEDURE:  Leon Sharp is an 77 year old man with  lumbar spondylosis, epidural lipomatosis, and significant radiculopathy in  his right lower extremity.  Preoperative imaging demonstrated significant  foraminal stenosis of L3-L4 and L4-L5 levels with lateral recess stenosis,  as well.  It was elected to take him to surgery for laminoforaminotomies at  these affected levels.   PROCEDURE:  Leon Sharp was brought to the operating room.  Following  satisfactory uncomplicated induction of general endotracheal anesthesia and  placement of intravenous lines, the patient was placed in a prone position  on the operating table on the Wilson frame.  His low back was then prepped  and draped in the usual sterile fashion.  The area of planned incision was  infiltrated with 0.25% Marcaine and 0.5% lidocaine with 1:200,000  epinephrine.  An incision was made in the midline and carried through  adipost tissue to the lumbodorsal fascia which was incised to the right  side  of the midline.  Subperiosteal dissection was performed exposing  the L3-L4  and L4-L5 levels.  A Versatrac retractor was used to facilitate exposure.  An interoperative x-ray demonstrated marker probes at the L3-L4 and L4-L5  levels.  Subsequently, under loupe magnification with a high speed drill,  laminoforaminotomies were performed of L3 and L4 and the hypertrophied  ligamentous tissue was removed with Kerrison rongeurs and bone removal was  completed with Kerrison rongeurs.  Generous foraminotomies were performed  overlying the superior aspect of L4 and L5 laminae and the microscope was  then brought onto the field using microdissection technique.  The lateral  aspect of the thecal sac was mobilized along with the nerve roots at each of  the affected levels.  Up angled curets were used to remove hypertrophied  ligamentous tissue and significant amount of epidural fat was removed with  suction and cautery.  The fat removal was performed across the midline to  the left side, as well, at each of these levels.  Subsequently, using a  variety of dilating hooks, the L5, L4, and L3 nerve roots were palpated in  their course out to the neural foramina and were felt to be well  decompressed.  The wound was then irrigated with Bacitracin saline and 80 mg  Depo-Medrol and 2 mL of Fentanyl were placed in the operative site.  The  self-retaining retractor was removed.  The lumbodorsal fascia was closed  with 0 Vicryl sutures, the subcutaneous tissues were reapproximated with 2-0  Vicryl interrupted inverted sutures, and the skin edges were reapproximated  with interrupted 3-0 Vicryl subcuticular stitch.  The wound was dressed with  Dermabond.  The patient was extubated in the operating room and taken to the  recovery room in stable condition having tolerated the operation well.  Counts were correct at the end of the case.       JDS/MEDQ  D:  01/17/2005  T:  01/17/2005  Job:  [id number]

## 2010-11-08 NOTE — Cardiovascular Report (Signed)
NAME:  Leon Sharp, Leon Sharp             ACCOUNT NO.:  [id number]   MEDICAL RECORD NO.:  [id number]          PATIENT TYPE:  OIB   LOCATION:  6529                         FACILITY:  [facility]   PHYSICIAN:  John R. Tysinger, M.D. DATE OF BIRTH:  Mar 29, 1922   DATE OF PROCEDURE:  03/19/2004  DATE OF DISCHARGE:                              CARDIAC CATHETERIZATION   PROCEDURES:  1.  Angioplasty with primary stenting with drug-eluting stent of the      proximal circumflex coronary artery, native vessel.  2.  Angioplasty with primary stenting with drug-eluting stent in the right      coronary artery vein graft.  3.  AngioSeal of the right femoral artery.   INDICATION FOR PROCEDURES:  This 76 year old male has a history of coronary  artery disease, status post failed bare-metal stent placement from 1996 with  total occlusion of his right coronary artery within the stent and partial  restenosis within the mid-circumflex stent.  He also had a chronic total  occlusion of his left anterior descending and underwent coronary artery  bypass graft surgery in 1999.  He moved to [place] approximately 6 months  ago and has since developed stable angina and a radio-nuclear stress test  showed evidence for myocardial ischemia.  He has a borderline renal function  with a BUN of 30 and a creatinine of 1.4 prior to a diagnostic  catheterization at [facility].  At cath, he was found to have a  critical stenosis in the proximal segment of his right coronary artery vein  graft and a new critical stenosis in his proximal nature circumflex.  His  circumflex vein graft was chronically totally occluded.  His vein graft to  his large diagonal or intermediate branch was patent and had normal  function.  His left internal mammary arterial graft was normal.  After  documenting that his renal function remained stable, he was then scheduled  for angioplasty today after discussing these findings with Dr. Jaclyn Prime.  Grove and Dr. Darlin Priestly.  They both recommended primary angioplasty.   PROCEDURE:  After signing an informed consent, the patient was premedicated  with 5 mg of Valium by mouth and brought to the cardiac catheterization lab.  His right groin was prepped and draped in a sterile fashion and anesthetized  locally with 1% lidocaine.  A 6-French introducer sheath was inserted  percutaneously into the right femoral artery.  A 6-French Vista XB 3.5 guide  catheter was inserted through the right femoral artery sheath and advanced  to the root of the aorta.  Injections were made into the left coronary  artery.  After sizing the lesion in the proximal circumflex, we selected a  CYPHER RX 3.0 x 8.0-mm drug-eluting stent, which, after proper preparation,  was inserted over the Hi-Torque Floppy guidewire and placed within the  lesion.  The stent was then deployed with 2 inflations, the first at 14  atmospheres for 29 seconds; the second inflation was at a maximum pressure  of 20 atmospheres for 22 seconds.  After the second inflation, the  deployment balloon was removed and injection  again into the left coronary  artery showed an excellent angiographic result with 0% residual lesion.  There was normal TIMI-3 antegrade flow.  This guide catheter was then  exchanged for a Vista RVC guide catheter, which was advanced to the  ascending aorta.  After engaging the tip of this catheter in the ostium of  the right coronary vein graft, injections were made into the vein graft.  We  then inserted the Hi-Torque Floppy guidewire into the vein graft.  After  sizing the lesion, we then selected a CYPHER RX 3.5 x 13.0-mm drug-eluting  stent and after proper preparation, this was inserted over the Hi-Torque  Floppy guidewire and positioned within the lesion.  The stent was then  deployed with 1 inflation at 20 atmospheres for 23 seconds.  After this  deployment inflation was made, the deployment balloon was  removed and  injection again into the vein graft showed an excellent angiographic result  with 0% residual lesion and normal TIMI-3 antegrade flow.  The patient  tolerated the procedure well and no complications were noted.  At the end of  the procedure, the catheter and sheath were removed from the right femoral  artery and hemostasis was easily obtained with an AngioSeal closure system.   MEDICATIONS GIVEN:  Heparin 4500 units IV, Integrilin drip per pharmacy  protocol.   TOTAL CONTRAST USED:  60 mL.   CINE FINDINGS:  Circumflex coronary artery:  The ostium appears normal.  The  proximal segment has a focal eccentric 90% stenosis just past a left atrial  branch.  The long stent in the middle segment was also visualized with a  long 60% narrowing within the stent.  There was initially TIMI-2 antegrade  flow.  Further cines showed proper positioning of the guidewire and balloon  catheter with a good balloon form obtained during the inflation.  Final  injection into the left coronary artery showed an excellent angiographic  result with 0% residual lesion and reestablishment of normal TIMI-3  antegrade flow.   Right coronary artery vein graft:  Initial injections showed a normal ostium  with a focal concentric 95% stenosis in the proximal segment.  The remainder  of the vein graft appeared normal.  Further injections showed proper  positioning of the guidewire and balloon catheter with a good balloon form  obtained.  Final injection into the right coronary artery vein graft showed  an excellent angiographic result with 0% residual lesion and normal TIMI-3  antegrade flow.  Prior to the deployment, the flow was TIMI-2.   FINAL DIAGNOSES:  1.  Successful angioplasty with primary stenting with drug-eluting stent in      the proximal circumflex coronary artery, native vessel.  2.  Successful angioplasty with primary stenting, drug-eluting stent, in the     right coronary artery vein  graft.  3.  Successful AngioSeal of the right femoral artery.   DISPOSITION:  We will monitor on the 6500 unit overnight with continuation  of the Integrilin drip x18 hours.  We will also continue aspirin and Plavix.  We will give increased fluids for the next 6 hours and recheck his BMET in  the a.m.       JRT/MEDQ  D:  03/19/2004  T:  03/19/2004  Job:  [id number]   cc:   [facility] Cath Lab

## 2010-11-08 NOTE — Op Note (Signed)
   NAME:  Leon Sharp, Leon Sharp                       ACCOUNT NO.:  [id number]   MEDICAL RECORD NO.:  [id number]                   PATIENT TYPE:  AMB   LOCATION:  ENDO                                 FACILITY:  [facility]   PHYSICIAN:  Georgiana Spinner, M.D.                 DATE OF BIRTH:  1921-12-14   DATE OF PROCEDURE:  DATE OF DISCHARGE:                                 OPERATIVE REPORT   PROCEDURE:  Colonoscopy.   INDICATIONS FOR PROCEDURE:  Colon cancer.   ANESTHESIA:  Demerol 40, Versed 5 mg.   DESCRIPTION OF PROCEDURE:  With the patient mildly sedated in the left  lateral decubitus position, the Olympus videoscopic colonoscope was inserted  in the rectum and immediately a suture was seen, photographed and we  advanced the endoscope rather easily to the cecum identified by the  ileocecal valve and appendiceal orifice both of which were photographed.  From this point, the colonoscope was slowly withdrawn taking circumferential  views of the colonic mucosa stopping to photograph diverticula seen along  the way in the sigmoid colon until we reached the rectum which appeared  normal other than the suture seen and was placed then in retroflex view to  view the anal canal from above, internal hemorrhoids were noted. The  endoscope was straightened and withdrawn. The patient's vital signs and  pulse oximeter remained stable. The patient tolerated the procedure well  without apparent complications.   FINDINGS:  Diverticulosis of the sigmoid colon, remnant of sigmoid colon.  Internal hemorrhoids, otherwise, an unremarkable examination post resection  of colon cancer.   PLAN:  Repeat examination probably in five years.                                               Georgiana Spinner, M.D.    GMO/MEDQ  D:  04/26/2003  T:  04/26/2003  Job:  [id number]

## 2011-03-17 LAB — BASIC METABOLIC PANEL
CO2: 24
CO2: 24
Chloride: 102
Chloride: 107
GFR calc Af Amer: >60
GFR calc non Af Amer: 46 — ABNORMAL LOW
Glucose, Bld: 112 — ABNORMAL HIGH
Potassium: 3.9
Potassium: 4.6
Sodium: 134 — ABNORMAL LOW
Sodium: 138

## 2011-03-17 LAB — CBC
HCT: 37.7 — ABNORMAL LOW
Hemoglobin: 12.9 — ABNORMAL LOW
Hemoglobin: 13.9
MCHC: 33.9
MCV: 94.7
MCV: 94.9
RBC: 4.34
RDW: 13.9

## 2011-03-17 LAB — CK TOTAL AND CKMB (NOT AT ARMC)
CK, MB: 5.4 — ABNORMAL HIGH
CK, MB: 6 — ABNORMAL HIGH
Total CK: 87

## 2011-03-17 LAB — TROPONIN I: Troponin I: 0.6

## 2011-04-02 ENCOUNTER — Other Ambulatory Visit: Payer: Self-pay | Admitting: Dermatology

## 2011-05-15 ENCOUNTER — Emergency Department (INDEPENDENT_AMBULATORY_CARE_PROVIDER_SITE_OTHER): Payer: Medicare Other

## 2011-05-15 ENCOUNTER — Encounter: Payer: Self-pay | Admitting: Family Medicine

## 2011-05-15 ENCOUNTER — Other Ambulatory Visit: Payer: Self-pay

## 2011-05-15 ENCOUNTER — Inpatient Hospital Stay (HOSPITAL_BASED_OUTPATIENT_CLINIC_OR_DEPARTMENT_OTHER)
Admission: EM | Admit: 2011-05-15 | Discharge: 2011-05-19 | DRG: 087 | Disposition: A | Discharge status: Home / Self Care | Payer: Medicare Other | Source: Ambulatory Visit | Attending: Internal Medicine | Admitting: Internal Medicine

## 2011-05-15 DIAGNOSIS — W19XXXA Unspecified fall, initial encounter: Secondary | ICD-10-CM

## 2011-05-15 DIAGNOSIS — S1093XA Contusion of unspecified part of neck, initial encounter: Secondary | ICD-10-CM

## 2011-05-15 DIAGNOSIS — M25519 Pain in unspecified shoulder: Secondary | ICD-10-CM

## 2011-05-15 DIAGNOSIS — M24419 Recurrent dislocation, unspecified shoulder: Secondary | ICD-10-CM

## 2011-05-15 DIAGNOSIS — E538 Deficiency of other specified B group vitamins: Secondary | ICD-10-CM | POA: Diagnosis present

## 2011-05-15 DIAGNOSIS — S065X9A Traumatic subdural hemorrhage with loss of consciousness of unspecified duration, initial encounter: Secondary | ICD-10-CM

## 2011-05-15 DIAGNOSIS — Z951 Presence of aortocoronary bypass graft: Secondary | ICD-10-CM

## 2011-05-15 DIAGNOSIS — S0003XA Contusion of scalp, initial encounter: Secondary | ICD-10-CM

## 2011-05-15 DIAGNOSIS — R55 Syncope and collapse: Secondary | ICD-10-CM | POA: Diagnosis present

## 2011-05-15 DIAGNOSIS — I209 Angina pectoris, unspecified: Secondary | ICD-10-CM | POA: Diagnosis present

## 2011-05-15 DIAGNOSIS — Z7902 Long term (current) use of antithrombotics/antiplatelets: Secondary | ICD-10-CM

## 2011-05-15 DIAGNOSIS — J449 Chronic obstructive pulmonary disease, unspecified: Secondary | ICD-10-CM | POA: Diagnosis present

## 2011-05-15 DIAGNOSIS — M549 Dorsalgia, unspecified: Secondary | ICD-10-CM

## 2011-05-15 DIAGNOSIS — I2510 Atherosclerotic heart disease of native coronary artery without angina pectoris: Secondary | ICD-10-CM | POA: Diagnosis present

## 2011-05-15 DIAGNOSIS — F341 Dysthymic disorder: Secondary | ICD-10-CM | POA: Diagnosis present

## 2011-05-15 DIAGNOSIS — N400 Enlarged prostate without lower urinary tract symptoms: Secondary | ICD-10-CM | POA: Diagnosis present

## 2011-05-15 DIAGNOSIS — E119 Type 2 diabetes mellitus without complications: Secondary | ICD-10-CM | POA: Diagnosis present

## 2011-05-15 DIAGNOSIS — Z7982 Long term (current) use of aspirin: Secondary | ICD-10-CM

## 2011-05-15 DIAGNOSIS — M2469 Ankylosis, other specified joint: Secondary | ICD-10-CM

## 2011-05-15 DIAGNOSIS — S065XAA Traumatic subdural hemorrhage with loss of consciousness status unknown, initial encounter: Principal | ICD-10-CM

## 2011-05-15 DIAGNOSIS — I10 Essential (primary) hypertension: Secondary | ICD-10-CM | POA: Diagnosis present

## 2011-05-15 DIAGNOSIS — M4802 Spinal stenosis, cervical region: Secondary | ICD-10-CM

## 2011-05-15 DIAGNOSIS — J4489 Other specified chronic obstructive pulmonary disease: Secondary | ICD-10-CM | POA: Diagnosis present

## 2011-05-15 HISTORY — DX: Atherosclerotic heart disease of native coronary artery without angina pectoris: I25.10

## 2011-05-15 HISTORY — DX: Angina pectoris, unspecified: I20.9

## 2011-05-15 HISTORY — DX: Malignant (primary) neoplasm, unspecified: C80.1

## 2011-05-15 HISTORY — DX: Chronic obstructive pulmonary disease, unspecified: J44.9

## 2011-05-15 HISTORY — DX: Encounter for other specified aftercare: Z51.89

## 2011-05-15 HISTORY — DX: Unspecified malignant neoplasm of skin, unspecified: C44.90

## 2011-05-15 HISTORY — DX: Reserved for inherently not codable concepts without codable children: IMO0001

## 2011-05-15 HISTORY — DX: Pure hypercholesterolemia, unspecified: E78.00

## 2011-05-15 LAB — CBC
HCT: 40.4 % (ref 39.0–52.0)
Hemoglobin: 13.3 g/dL (ref 13.0–17.0)
MCH: 30.7 pg (ref 26.0–34.0)
MCHC: 32.9 g/dL (ref 30.0–36.0)
MCV: 93.3 fL (ref 78.0–100.0)
Platelets: 134 10*3/uL — ABNORMAL LOW (ref 150–400)
RBC: 4.33 MIL/uL (ref 4.22–5.81)
RDW: 13.1 % (ref 11.5–15.5)
WBC: 11.5 10*3/uL — ABNORMAL HIGH (ref 4.0–10.5)

## 2011-05-15 LAB — BASIC METABOLIC PANEL
BUN: 36 mg/dL — ABNORMAL HIGH (ref 6–23)
CO2: 27 mEq/L (ref 19–32)
Calcium: 9 mg/dL (ref 8.4–10.5)
Chloride: 103 mEq/L (ref 96–112)
Creatinine, Ser: 1.8 mg/dL — ABNORMAL HIGH (ref 0.50–1.35)
GFR calc Af Amer: 37 mL/min — ABNORMAL LOW (ref >90)
GFR calc non Af Amer: 32 mL/min — ABNORMAL LOW (ref >90)
Glucose, Bld: 165 mg/dL — ABNORMAL HIGH (ref 70–99)
Potassium: 5.3 mEq/L — ABNORMAL HIGH (ref 3.5–5.1)
Sodium: 138 mEq/L (ref 135–145)

## 2011-05-15 LAB — GLUCOSE, CAPILLARY: Glucose-Capillary: 155 mg/dL — ABNORMAL HIGH (ref 70–99)

## 2011-05-15 LAB — PROTIME-INR
INR: 1.05 (ref 0.00–1.49)
Prothrombin Time: 13.9 seconds (ref 11.6–15.2)

## 2011-05-15 MED ORDER — FAMOTIDINE 20 MG PO TABS
20.0000 mg | ORAL_TABLET | Freq: Every day | ORAL | Status: DC
  Administered 2011-05-16 – 2011-05-19 (×4): 20 mg via ORAL
  Filled 2011-05-15 (×4): qty 1

## 2011-05-15 MED ORDER — CELECOXIB 200 MG PO CAPS
200.0000 mg | ORAL_CAPSULE | Freq: Every day | ORAL | Status: DC
  Administered 2011-05-16 – 2011-05-19 (×4): 200 mg via ORAL
  Filled 2011-05-15 (×4): qty 1

## 2011-05-15 MED ORDER — CYANOCOBALAMIN 1000 MCG/ML IJ SOLN: 1000.0000 ug | INTRAMUSCULAR | Status: DC

## 2011-05-15 MED ORDER — SIMVASTATIN 20 MG PO TABS
20.0000 mg | ORAL_TABLET | Freq: Every day | ORAL | Status: DC
  Administered 2011-05-16 – 2011-05-19 (×4): 20 mg via ORAL
  Filled 2011-05-15 (×4): qty 1

## 2011-05-15 MED ORDER — ONDANSETRON HCL 4 MG/2ML IJ SOLN: 4.0000 mg | Freq: Four times a day (QID) | INTRAMUSCULAR | Status: DC | PRN

## 2011-05-15 MED ORDER — FLUTICASONE PROPIONATE 50 MCG/ACT NA SUSP
2.0000 | Freq: Every day | NASAL | Status: DC
  Administered 2011-05-16 – 2011-05-19 (×4): 2 via NASAL
  Filled 2011-05-15: qty 16

## 2011-05-15 MED ORDER — ACETAMINOPHEN 650 MG RE SUPP: 650.0000 mg | Freq: Four times a day (QID) | RECTAL | Status: DC | PRN

## 2011-05-15 MED ORDER — OMEGA-3-ACID ETHYL ESTERS 1 G PO CAPS
1.0000 g | ORAL_CAPSULE | Freq: Every day | ORAL | Status: DC
  Administered 2011-05-16 – 2011-05-19 (×4): 1 g via ORAL
  Filled 2011-05-15 (×5): qty 1

## 2011-05-15 MED ORDER — DIAZEPAM 5 MG PO TABS: 5.0000 mg | ORAL_TABLET | Freq: Two times a day (BID) | ORAL | Status: DC | PRN

## 2011-05-15 MED ORDER — TIOTROPIUM BROMIDE MONOHYDRATE 18 MCG IN CAPS
18.0000 ug | ORAL_CAPSULE | Freq: Every day | RESPIRATORY_TRACT | Status: DC
  Administered 2011-05-17 – 2011-05-19 (×3): 18 ug via RESPIRATORY_TRACT
  Filled 2011-05-15: qty 5

## 2011-05-15 MED ORDER — INSULIN ASPART 100 UNIT/ML ~~LOC~~ SOLN
0.0000 [IU] | Freq: Three times a day (TID) | SUBCUTANEOUS | Status: DC
  Administered 2011-05-16: 1 [IU] via SUBCUTANEOUS
  Administered 2011-05-16: 2 [IU] via SUBCUTANEOUS
  Filled 2011-05-15: qty 3

## 2011-05-15 MED ORDER — SODIUM CHLORIDE 0.9 % IV SOLN
INTRAVENOUS | Status: DC
  Administered 2011-05-16 (×2): via INTRAVENOUS

## 2011-05-15 MED ORDER — BISOPROLOL FUMARATE 5 MG PO TABS
5.0000 mg | ORAL_TABLET | Freq: Every day | ORAL | Status: DC
  Administered 2011-05-16 – 2011-05-19 (×4): 5 mg via ORAL
  Filled 2011-05-15 (×4): qty 1

## 2011-05-15 MED ORDER — DULOXETINE HCL 60 MG PO CPEP
60.0000 mg | ORAL_CAPSULE | Freq: Every day | ORAL | Status: DC
  Administered 2011-05-16 – 2011-05-19 (×4): 60 mg via ORAL
  Filled 2011-05-15 (×4): qty 1

## 2011-05-15 MED ORDER — ONDANSETRON HCL 4 MG PO TABS: 4.0000 mg | ORAL_TABLET | Freq: Four times a day (QID) | ORAL | Status: DC | PRN

## 2011-05-15 MED ORDER — TAMSULOSIN HCL 0.4 MG PO CAPS
0.4000 mg | ORAL_CAPSULE | Freq: Every day | ORAL | Status: DC
  Administered 2011-05-16 – 2011-05-19 (×4): 0.4 mg via ORAL
  Filled 2011-05-15 (×4): qty 1

## 2011-05-15 MED ORDER — NITROGLYCERIN 0.8 MG/HR TD PT24: 1.0000 | MEDICATED_PATCH | Freq: Every day | TRANSDERMAL | Status: DC

## 2011-05-15 MED ORDER — LINAGLIPTIN 5 MG PO TABS
5.0000 mg | ORAL_TABLET | Freq: Every day | ORAL | Status: DC
  Administered 2011-05-16 – 2011-05-19 (×4): 5 mg via ORAL
  Filled 2011-05-15 (×4): qty 1

## 2011-05-15 MED ORDER — ACETAMINOPHEN 325 MG PO TABS
650.0000 mg | ORAL_TABLET | Freq: Four times a day (QID) | ORAL | Status: DC | PRN
  Administered 2011-05-16: 650 mg via ORAL
  Filled 2011-05-15: qty 2

## 2011-05-15 MED ORDER — NITROGLYCERIN 0.4 MG SL SUBL: 0.4000 mg | SUBLINGUAL_TABLET | SUBLINGUAL | Status: DC | PRN

## 2011-05-15 NOTE — Consult Note (Signed)
Reason for Consult: Closed head injury, subdural hematoma Referring Physician: [facility]  Leon Sharp is an 77 y.o. male.  HPI: Patient is an 77 year old woman who has a history of syncope who had a syncopal episode earlier today he fell into a screen door. Brief loss of consciousness and he is not remember exactly where when he landed on her how long this out. He came back to the was bruised but with no significant headache or numbness in his arms or legs. Patient was taken to med center [facility] was evaluated with a head CT was noted to have a small amount of subdural hematoma the patient was on Plavix was then transferred to [facility]. Currently the patient denies any significant headache denies any nausea or vomiting denies any numbness or tingling in his arms or his legs.  Past Medical History  Diagnosis Date  . Diabetes mellitus   . High cholesterol   . Cancer   . Skin cancer   . Angina   . COPD (chronic obstructive pulmonary disease)     Past Surgical History  Procedure Date  . Cardiac surgery   . Cholecystectomy   . Coronary artery bypass graft   . Back surgery     History reviewed. No pertinent family history.  Social History:  reports that he has quit smoking. He does not have any smokeless tobacco history on file. He reports that he drinks about .6 ounces of alcohol per week. He reports that he does not use illicit drugs.  Allergies: No Known Allergies  Medications: I have reviewed the patient's current medications. Physical exam: Patient is awake alert and oriented x4. Pupils are equal round and reactive to light. Extraocular movements are intact. Strength is 5 of 5 in his upper and lower extremities with no pronator drift. Results for orders placed during the hospital encounter of 05/15/11 (from the past 48 hour(s))  BASIC METABOLIC PANEL     Status: Abnormal   Collection Time   05/15/11  6:27 PM      Component Value Range Comment   Sodium 138   135 - 145 (mEq/L)    Potassium 5.3 (*) 3.5 - 5.1 (mEq/L)    Chloride 103  96 - 112 (mEq/L)    CO2 27  19 - 32 (mEq/L)    Glucose, Bld 165 (*) 70 - 99 (mg/dL)    BUN 36 (*) 6 - 23 (mg/dL)    Creatinine, Ser 1.61 (*) 0.50 - 1.35 (mg/dL)    Calcium 9.0  8.4 - 10.5 (mg/dL)    GFR calc non Af Amer 32 (*) >90 (mL/min)    GFR calc Af Amer 37 (*) >90 (mL/min)   APTT     Status: Normal   Collection Time   05/15/11  6:27 PM      Component Value Range Comment   aPTT 35  24 - 37 (seconds)   PROTIME-INR     Status: Normal   Collection Time   05/15/11  6:27 PM      Component Value Range Comment   Prothrombin Time 13.9  11.6 - 15.2 (seconds)    INR 1.05  0.00 - 1.49    CBC     Status: Abnormal   Collection Time   05/15/11  6:27 PM      Component Value Range Comment   WBC 11.5 (*) 4.0 - 10.5 (K/uL)    RBC 4.33  4.22 - 5.81 (MIL/uL)    Hemoglobin 13.3  13.0 -  17.0 (g/dL)    HCT 04.5  40.9 - 81.1 (%)    MCV 93.3  78.0 - 100.0 (fL)    MCH 30.7  26.0 - 34.0 (pg)    MCHC 32.9  30.0 - 36.0 (g/dL)    RDW 91.4  78.2 - 95.6 (%)    Platelets 134 (*) 150 - 400 (K/uL)   GLUCOSE, CAPILLARY     Status: Abnormal   Collection Time   05/15/11  8:36 PM      Component Value Range Comment   Glucose-Capillary 155 (*) 70 - 99 (mg/dL)    Comment 1 Notify RN      Comment 2 Documented in Chart       Dg Shoulder Right  05/15/2011  *RADIOLOGY REPORT*  Clinical Data: 77 year old male status post fall with pain.  RIGHT SHOULDER - 2+ VIEW  Comparison: 10/07/2010.  Findings: Right AC joint separation re-identified with one full shaft width of caudal displacement of the acromion relative to the clavicle.  There is new heterotopic/dystrophic calcification between the distal clavicle and acromion April.  The right clavicle remains intact.  The proximal right humerus appears intact. No glenohumeral joint dislocation.  Visualized right ribs and lung parenchyma are within normal limits.  IMPRESSION: 1.  Continued/chronic  right AC joint separation with post traumatic calcification which has developed since April. 2.  No superimposed acute fracture or dislocation identified at the right shoulder.  Original Report Authenticated By: Harley Hallmark, M.D.   Ct Head Wo Contrast  05/15/2011  *RADIOLOGY REPORT*  Clinical Data:  77 year old male status post fall with laceration, loss of consciousness, pain.  CT HEAD WITHOUT CONTRAST CT CERVICAL SPINE WITHOUT CONTRAST  Technique:  Multidetector CT imaging of the head and cervical spine was performed following the standard protocol without intravenous contrast.  Multiplanar CT image reconstructions of the cervical spine were also generated.  Comparison:  Cervical MRI 02/13/2010.  CT HEAD  Findings: Visualized paranasal sinuses and mastoids are clear. Postoperative changes to the globes. Calcified atherosclerosis at the skull base.  Left posterior scalp laceration and hematoma. Underlying calvarium intact.  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  Generalized volume loss.  No ventriculomegaly.  Patchy cerebral white matter hypodensity. No evidence of cortically based acute infarction identified.  Trace para falcine subdural hematoma on the left (image 24).  Small volume of subdural blood along the left tentorium (image 12).  No other acute intracranial hemorrhage.  IMPRESSION: 1.  Small volume left subdural hematoma seen along the falx and tentorium.  No mass effect. 2.  No other acute traumatic injury to the brain. 3.  Posterior scalp hematoma without underlying fracture. 4.  Cervical findings are below.  CT CERVICAL SPINE  Findings: Visualized paraspinal soft tissues are within normal limits.  Retropharyngeal course of the carotid incidentally noted.  Chronic bulky anterior cervical endplate spurring with ankylosis from the C3 to the C6 level.  Chronic disc degeneration and spinal stenosis at C2-C3.  Chronic disc degeneration which might also result in spinal stenosis at C6-C7.   Visualized skull base is intact.  No atlanto-occipital dissociation.  Advanced upper cervical facet degeneration, vacuum facet phenomenon on the right at C2-C3. Cervicothoracic junction alignment is within normal limits.  Bilateral posterior element alignment is within normal limits.  No acute cervical fracture. Upper thoracic levels appear grossly intact.  IMPRESSION:  1. No acute fracture or listhesis identified in the cervical spine. Ligamentous injury is not excluded. 2.  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  Subsequent advanced degenerative changes with chronic spinal stenosis at C2-C3 and C6- C7.  Critical Value/emergent results were called by telephone at the time of interpretation on 05/15/2011  at 1812 hours  to  RN Hetty Blend, who verbally acknowledged these results.  Original Report Authenticated By: Harley Hallmark, M.D.   Ct Cervical Spine Wo Contrast  05/15/2011  *RADIOLOGY REPORT*  Clinical Data:  77 year old male status post fall with laceration, loss of consciousness, pain.  CT HEAD WITHOUT CONTRAST CT CERVICAL SPINE WITHOUT CONTRAST  Technique:  Multidetector CT imaging of the head and cervical spine was performed following the standard protocol without intravenous contrast.  Multiplanar CT image reconstructions of the cervical spine were also generated.  Comparison:  Cervical MRI 02/13/2010.  CT HEAD  Findings: Visualized paranasal sinuses and mastoids are clear. Postoperative changes to the globes. Calcified atherosclerosis at the skull base.  Left posterior scalp laceration and hematoma. Underlying calvarium intact.  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  Generalized volume loss.  No ventriculomegaly.  Patchy cerebral white matter hypodensity. No evidence of cortically based acute infarction identified.  Trace para falcine subdural hematoma on the left (image 24).  Small volume of subdural blood along the left tentorium (image 12).  No other acute intracranial hemorrhage.   IMPRESSION: 1.  Small volume left subdural hematoma seen along the falx and tentorium.  No mass effect. 2.  No other acute traumatic injury to the brain. 3.  Posterior scalp hematoma without underlying fracture. 4.  Cervical findings are below.  CT CERVICAL SPINE  Findings: Visualized paraspinal soft tissues are within normal limits.  Retropharyngeal course of the carotid incidentally noted.  Chronic bulky anterior cervical endplate spurring with ankylosis from the C3 to the C6 level.  Chronic disc degeneration and spinal stenosis at C2-C3.  Chronic disc degeneration which might also result in spinal stenosis at C6-C7.  Visualized skull base is intact.  No atlanto-occipital dissociation.  Advanced upper cervical facet degeneration, vacuum facet phenomenon on the right at C2-C3. Cervicothoracic junction alignment is within normal limits.  Bilateral posterior element alignment is within normal limits.  No acute cervical fracture. Upper thoracic levels appear grossly intact.  IMPRESSION:  1. No acute fracture or listhesis identified in the cervical spine. Ligamentous injury is not excluded. 2.  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  Subsequent advanced degenerative changes with chronic spinal stenosis at C2-C3 and C6- C7.  Critical Value/emergent results were called by telephone at the time of interpretation on 05/15/2011  at 1812 hours  to  RN Hetty Blend, who verbally acknowledged these results.  Original Report Authenticated By: Ulla Potash III, M.D.    @ROS  Blood pressure 111/56, pulse 76, temperature 97.5 F (36.4 C), temperature source Oral, resp. rate 20, height 5\' 9"  (1.753 m), weight 76.9 kg (169 lb 8.5 oz), SpO2 98.00%. @PHYSEXAMBYAGE2 @  Assessment/Plan: Patient has been admitted for overnight observation recommended repeat head CT in the morning without contrast. Would recommend holding his Plavix for 2-3 days well we make sure that the subdural collection is stable on followup and CTs. Patient  may mobilize ad lib. and diet may read the normal.  Skip Litke P 05/15/2011, 10:38 PM

## 2011-05-15 NOTE — ED Notes (Signed)
MD at bedside. 

## 2011-05-15 NOTE — ED Notes (Signed)
Unsuccessfully attempted to call report to [phone] x3, Marquise, Nursing Secretary states that the RN is unavailable, or not found each time I have called.  [facility] RN to give report upon arrival.

## 2011-05-15 NOTE — H&P (Signed)
Leon Sharp is an 77 y.o. male.   Chief Complaint: fall HPI: 77 year old male with known history of coronary artery disease status post bypass and stent history of diabetes mellitus2 history of hypertension was going to have hair cut, when patient in the parking lot suddenly lost his consciousness. He states while he was trying to get into the car the next thing he realized was that he was on the floor. He thinks he may have just lost his consciousness for a few seconds. Just before he fell he did feel dizzy. He has been having syncopal episodes recently. The last time he had one was 2 months ago and another one 9 months ago. Denies any focal deficit, denies any headache blurred vision, any incontinence of urine or any seizure like activity. Patient was brought to the ER, he had CAT scan of his head which showed left-sided subdural hematoma. Neurosurgeon on call Dr.Cram was consulted by ER physician. Dr. Wynetta Emery advised admission under hospitalist and will be seen the patient in consult. Patient at this time on exam denies any headache chest pain shortness of breath weakness of limbs.  Past Medical History  Diagnosis Date  . Diabetes mellitus   . High cholesterol   . Cancer   . Skin cancer   . Angina   . COPD (chronic obstructive pulmonary disease)   . Blood transfusion   . Coronary artery disease     Past Surgical History  Procedure Date  . Cardiac surgery   . Cholecystectomy   . Coronary artery bypass graft   . Back surgery   . Colon surgery     History reviewed. No pertinent family history. Social History:  reports that he has quit smoking. He does not have any smokeless tobacco history on file. He reports that he drinks about .6 ounces of alcohol per week. He reports that he does not use illicit drugs.  Allergies: No Known Allergies  Medications Prior to Admission  Medication Dose Route Frequency Provider Last Rate Last Dose  . 0.9 %  sodium chloride infusion   Intravenous  Continuous Eduard Clos      . acetaminophen (TYLENOL) tablet 650 mg  650 mg Oral Q6H PRN Eduard Clos       Or  . acetaminophen (TYLENOL) suppository 650 mg  650 mg Rectal Q6H PRN Eduard Clos      . bisoprolol (ZEBETA) tablet 5 mg  5 mg Oral Daily Eduard Clos      . celecoxib (CELEBREX) capsule 200 mg  200 mg Oral Daily Eduard Clos      . cyanocobalamin ((VITAMIN B-12)) injection 1,000 mcg  1,000 mcg Intramuscular Q30 days Eduard Clos      . diazepam (VALIUM) tablet 5 mg  5 mg Oral BID PRN Eduard Clos      . DULoxetine (CYMBALTA) DR capsule 60 mg  60 mg Oral Daily Eduard Clos      . famotidine (PEPCID) tablet 20 mg  20 mg Oral Daily Eduard Clos      . fluticasone (FLONASE) 50 MCG/ACT nasal spray 2 spray  2 spray Each Nare Daily Eduard Clos      . linagliptin (TRADJENTA) tablet 5 mg  5 mg Oral Daily Eduard Clos      . nitroGLYCERIN (NITRO-DUR) 0.8 MG/HR 1 patch  1 patch Transdermal Daily Eduard Clos      . nitroGLYCERIN (NITROSTAT) SL tablet 0.4 mg  0.4  mg Sublingual Q5 min PRN Eduard Clos      . omega-3 acid ethyl esters (LOVAZA) capsule 1 g  1 g Oral Daily Eduard Clos      . ondansetron (ZOFRAN) tablet 4 mg  4 mg Oral Q6H PRN Eduard Clos       Or  . ondansetron (ZOFRAN) injection 4 mg  4 mg Intravenous Q6H PRN Eduard Clos      . simvastatin (ZOCOR) tablet 20 mg  20 mg Oral Daily Eduard Clos      . Tamsulosin HCl (FLOMAX) capsule 0.4 mg  0.4 mg Oral Daily Eduard Clos      . tiotropium (SPIRIVA) inhalation capsule 18 mcg  18 mcg Inhalation Daily Eduard Clos       No current outpatient prescriptions on file as of 05/15/2011.    Results for orders placed during the hospital encounter of 05/15/11 (from the past 48 hour(s))  BASIC METABOLIC PANEL     Status: Abnormal   Collection Time   05/15/11  6:27 PM      Component Value  Range Comment   Sodium 138  135 - 145 (mEq/L)    Potassium 5.3 (*) 3.5 - 5.1 (mEq/L)    Chloride 103  96 - 112 (mEq/L)    CO2 27  19 - 32 (mEq/L)    Glucose, Bld 165 (*) 70 - 99 (mg/dL)    BUN 36 (*) 6 - 23 (mg/dL)    Creatinine, Ser 1.61 (*) 0.50 - 1.35 (mg/dL)    Calcium 9.0  8.4 - 10.5 (mg/dL)    GFR calc non Af Amer 32 (*) >90 (mL/min)    GFR calc Af Amer 37 (*) >90 (mL/min)   APTT     Status: Normal   Collection Time   05/15/11  6:27 PM      Component Value Range Comment   aPTT 35  24 - 37 (seconds)   PROTIME-INR     Status: Normal   Collection Time   05/15/11  6:27 PM      Component Value Range Comment   Prothrombin Time 13.9  11.6 - 15.2 (seconds)    INR 1.05  0.00 - 1.49    CBC     Status: Abnormal   Collection Time   05/15/11  6:27 PM      Component Value Range Comment   WBC 11.5 (*) 4.0 - 10.5 (K/uL)    RBC 4.33  4.22 - 5.81 (MIL/uL)    Hemoglobin 13.3  13.0 - 17.0 (g/dL)    HCT 09.6  04.5 - 40.9 (%)    MCV 93.3  78.0 - 100.0 (fL)    MCH 30.7  26.0 - 34.0 (pg)    MCHC 32.9  30.0 - 36.0 (g/dL)    RDW 81.1  91.4 - 78.2 (%)    Platelets 134 (*) 150 - 400 (K/uL)   GLUCOSE, CAPILLARY     Status: Abnormal   Collection Time   05/15/11  8:36 PM      Component Value Range Comment   Glucose-Capillary 155 (*) 70 - 99 (mg/dL)    Comment 1 Notify RN      Comment 2 Documented in Chart      Dg Shoulder Right  05/15/2011  *RADIOLOGY REPORT*  Clinical Data: 77 year old male status post fall with pain.  RIGHT SHOULDER - 2+ VIEW  Comparison: 10/07/2010.  Findings: Right AC joint separation re-identified with one full shaft width  of caudal displacement of the acromion relative to the clavicle.  There is new heterotopic/dystrophic calcification between the distal clavicle and acromion April.  The right clavicle remains intact.  The proximal right humerus appears intact. No glenohumeral joint dislocation.  Visualized right ribs and lung parenchyma are within normal limits.   IMPRESSION: 1.  Continued/chronic right AC joint separation with post traumatic calcification which has developed since April. 2.  No superimposed acute fracture or dislocation identified at the right shoulder.  Original Report Authenticated By: Harley Hallmark, M.D.   Ct Head Wo Contrast  05/15/2011  *RADIOLOGY REPORT*  Clinical Data:  78 year old male status post fall with laceration, loss of consciousness, pain.  CT HEAD WITHOUT CONTRAST CT CERVICAL SPINE WITHOUT CONTRAST  Technique:  Multidetector CT imaging of the head and cervical spine was performed following the standard protocol without intravenous contrast.  Multiplanar CT image reconstructions of the cervical spine were also generated.  Comparison:  Cervical MRI 02/13/2010.  CT HEAD  Findings: Visualized paranasal sinuses and mastoids are clear. Postoperative changes to the globes. Calcified atherosclerosis at the skull base.  Left posterior scalp laceration and hematoma. Underlying calvarium intact.  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  Generalized volume loss.  No ventriculomegaly.  Patchy cerebral white matter hypodensity. No evidence of cortically based acute infarction identified.  Trace para falcine subdural hematoma on the left (image 24).  Small volume of subdural blood along the left tentorium (image 12).  No other acute intracranial hemorrhage.  IMPRESSION: 1.  Small volume left subdural hematoma seen along the falx and tentorium.  No mass effect. 2.  No other acute traumatic injury to the brain. 3.  Posterior scalp hematoma without underlying fracture. 4.  Cervical findings are below.  CT CERVICAL SPINE  Findings: Visualized paraspinal soft tissues are within normal limits.  Retropharyngeal course of the carotid incidentally noted.  Chronic bulky anterior cervical endplate spurring with ankylosis from the C3 to the C6 level.  Chronic disc degeneration and spinal stenosis at C2-C3.  Chronic disc degeneration which might also  result in spinal stenosis at C6-C7.  Visualized skull base is intact.  No atlanto-occipital dissociation.  Advanced upper cervical facet degeneration, vacuum facet phenomenon on the right at C2-C3. Cervicothoracic junction alignment is within normal limits.  Bilateral posterior element alignment is within normal limits.  No acute cervical fracture. Upper thoracic levels appear grossly intact.  IMPRESSION:  1. No acute fracture or listhesis identified in the cervical spine. Ligamentous injury is not excluded. 2.  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  Subsequent advanced degenerative changes with chronic spinal stenosis at C2-C3 and C6- C7.  Critical Value/emergent results were called by telephone at the time of interpretation on 05/15/2011  at 1812 hours  to  RN Hetty Blend, who verbally acknowledged these results.  Original Report Authenticated By: Harley Hallmark, M.D.   Ct Cervical Spine Wo Contrast  05/15/2011  *RADIOLOGY REPORT*  Clinical Data:  77 year old male status post fall with laceration, loss of consciousness, pain.  CT HEAD WITHOUT CONTRAST CT CERVICAL SPINE WITHOUT CONTRAST  Technique:  Multidetector CT imaging of the head and cervical spine was performed following the standard protocol without intravenous contrast.  Multiplanar CT image reconstructions of the cervical spine were also generated.  Comparison:  Cervical MRI 02/13/2010.  CT HEAD  Findings: Visualized paranasal sinuses and mastoids are clear. Postoperative changes to the globes. Calcified atherosclerosis at the skull base.  Left posterior scalp laceration and hematoma. Underlying  calvarium intact.  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  Generalized volume loss.  No ventriculomegaly.  Patchy cerebral white matter hypodensity. No evidence of cortically based acute infarction identified.  Trace para falcine subdural hematoma on the left (image 24).  Small volume of subdural blood along the left tentorium (image 12).  No  other acute intracranial hemorrhage.  IMPRESSION: 1.  Small volume left subdural hematoma seen along the falx and tentorium.  No mass effect. 2.  No other acute traumatic injury to the brain. 3.  Posterior scalp hematoma without underlying fracture. 4.  Cervical findings are below.  CT CERVICAL SPINE  Findings: Visualized paraspinal soft tissues are within normal limits.  Retropharyngeal course of the carotid incidentally noted.  Chronic bulky anterior cervical endplate spurring with ankylosis from the C3 to the C6 level.  Chronic disc degeneration and spinal stenosis at C2-C3.  Chronic disc degeneration which might also result in spinal stenosis at C6-C7.  Visualized skull base is intact.  No atlanto-occipital dissociation.  Advanced upper cervical facet degeneration, vacuum facet phenomenon on the right at C2-C3. Cervicothoracic junction alignment is within normal limits.  Bilateral posterior element alignment is within normal limits.  No acute cervical fracture. Upper thoracic levels appear grossly intact.  IMPRESSION:  1. No acute fracture or listhesis identified in the cervical spine. Ligamentous injury is not excluded. 2.  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  Subsequent advanced degenerative changes with chronic spinal stenosis at C2-C3 and C6- C7.  Critical Value/emergent results were called by telephone at the time of interpretation on 05/15/2011  at 1812 hours  to  RN Hetty Blend, who verbally acknowledged these results.  Original Report Authenticated By: Harley Hallmark, M.D.    Review of Systems  Constitutional: Negative.   HENT: Negative.   Eyes: Negative.   Respiratory: Negative.   Cardiovascular: Negative.   Gastrointestinal: Negative.   Genitourinary: Negative.   Musculoskeletal: Positive for falls.  Skin: Negative.        Bruise on face and scalp and skin tear on right hand  Neurological: Positive for dizziness and loss of consciousness.  Psychiatric/Behavioral: Negative.      Blood pressure 111/56, pulse 76, temperature 97.5 F (36.4 C), temperature source Oral, resp. rate 20, height 5\' 9"  (1.753 m), weight 76.9 kg (169 lb 8.5 oz), SpO2 98.00%. Physical Exam  Constitutional: He is oriented to person, place, and time. He appears well-developed and well-nourished.  HENT:       Chronic skin change on the lower eyelid of right eye. Also has bruise on the right side of face and occipital area of scalp.  Eyes:       See HEENT  Neck: Normal range of motion. Neck supple.  Cardiovascular: Normal rate, regular rhythm, normal heart sounds and intact distal pulses.   Respiratory: Effort normal and breath sounds normal.  GI: Soft. Bowel sounds are normal.  Musculoskeletal: Normal range of motion.  Neurological: He is alert and oriented to person, place, and time.       Moves upper and lower extremity 5/5  Skin:       Bruise on the right side of the face. Skin tear on the right hand. Scalp lesion after fall on the occipital area.  Psychiatric: His behavior is normal.     Assessment/Plan #1 left-sided subdural hematoma status post fall #2 syncope #3 history of CAD status post CABG and stent #4 history of diabetes mellitus2 #5 history of colon cancer status post resection  in 1996 #6 history of hypertension  Plan At this time patient will be observed in step down unit Neurosurgeon Dr. Wynetta Emery will be evaluating the patient. Will follow his recommendations. A repeat CT head has been ordered for a.m. for the followup on the subdural hematoma. For now we will be holding his Plavix and aspirin. Patient will need workup for his syncopal episodes. For now I am ordering a 2-D echo and cardiac enzymes.  Eduard Clos 05/15/2011, 11:37 PM

## 2011-05-15 NOTE — ED Provider Notes (Addendum)
History     CSN: [id number] Arrival date & time: 05/15/2011  4:55 PM   First MD Initiated Contact with Patient 05/15/11 1706      Chief Complaint  Patient presents with  . Fall    (Consider location/radiation/quality/duration/timing/severity/associated sxs/prior treatment) Patient is a 77 y.o. male presenting with fall. The history is provided by the patient and a relative.  Fall The accident occurred less than 1 hour ago. The fall occurred while walking (was a witnessed fall, per family member, pt was trying to step over a door ledge and tripped, tried to catch himself, but lost his balance and fell back into screen door.   had brief LOC). Distance fallen: from standing. He landed on a hard floor. The point of impact was the head. The pain is present in the right shoulder. The pain is mild. He was ambulatory at the scene. There was alcohol use (one beer) involved in the accident. Associated symptoms include headaches and loss of consciousness. Pertinent negatives include no fever, no numbness, no abdominal pain, no nausea, no vomiting and no hematuria. He has tried nothing for the symptoms.    Past Medical History  Diagnosis Date  . Diabetes mellitus   . High cholesterol   . Cancer   . Skin cancer COPD BPH CAD s/p CABG    Past Surgical History  Procedure Date  . Cardiac surgery   . Cholecystectomy     No family history on file.  History  Substance Use Topics  . Smoking status: Former Games developer  . Smokeless tobacco: Not on file  . Alcohol Use: 0.6 oz/week    1 Cans of beer per week     rarely      Review of Systems  Constitutional: Negative for fever, chills, diaphoresis and fatigue.  HENT: Negative for congestion, rhinorrhea, sneezing and neck pain.   Eyes: Negative.   Respiratory: Negative for cough, chest tightness and shortness of breath.   Cardiovascular: Negative for chest pain and leg swelling.  Gastrointestinal: Negative for nausea, vomiting, abdominal  pain, diarrhea and blood in stool.  Genitourinary: Negative for frequency, hematuria, flank pain and difficulty urinating.  Musculoskeletal: Negative for back pain, joint swelling and arthralgias.  Skin: Positive for wound. Negative for rash.  Neurological: Positive for loss of consciousness, light-headedness and headaches. Negative for dizziness, facial asymmetry, speech difficulty, weakness and numbness.       Had some dizziness on standing  Psychiatric/Behavioral: Negative for confusion.    Allergies  Review of patient's allergies indicates no known allergies.  Home Medications   Current Outpatient Rx  Name Route Sig Dispense Refill  . ASPIRIN 81 MG PO TABS Oral Take 81 mg by mouth daily.      Marland Kitchen BISOPROLOL FUMARATE 5 MG PO TABS Oral Take 5 mg by mouth daily.      . CELECOXIB 200 MG PO CAPS Oral Take 200 mg by mouth daily.      Marland Kitchen CLOPIDOGREL BISULFATE 75 MG PO TABS Oral Take 75 mg by mouth daily.      . CYANOCOBALAMIN 1000 MCG/ML IJ SOLN Intramuscular Inject 1,000 mcg into the muscle every 30 (thirty) days.      Marland Kitchen DIAZEPAM 5 MG PO TABS Oral Take 5 mg by mouth 2 (two) times daily as needed. For stomach    . DULOXETINE HCL 60 MG PO CPEP Oral Take 60 mg by mouth daily.      Marland Kitchen FLUTICASONE PROPIONATE 50 MCG/ACT NA SUSP Nasal Place 2 sprays  into the nose daily.      Marland Kitchen NITROGLYCERIN 0.8 MG/HR TD PT24 Transdermal Place 1 patch onto the skin daily.     Marland Kitchen NITROGLYCERIN 0.4 MG SL SUBL Sublingual Place 0.4 mg under the tongue every 5 (five) minutes as needed. For chest pain      . FISH OIL 1200 MG PO CAPS Oral Take 1 capsule by mouth daily.     Marland Kitchen RANITIDINE HCL 300 MG PO TABS Oral Take 300 mg by mouth 2 (two) times daily.      Marland Kitchen SIMVASTATIN 20 MG PO TABS Oral Take 20 mg by mouth daily.      Marland Kitchen SITAGLIPTIN PHOSPHATE 25 MG PO TABS Oral Take 25 mg by mouth daily.      Marland Kitchen TAMSULOSIN HCL 0.4 MG PO CAPS Oral Take 0.4 mg by mouth daily.      Marland Kitchen TIOTROPIUM BROMIDE MONOHYDRATE 18 MCG IN CAPS Inhalation  Place 18 mcg into inhaler and inhale daily.        BP 118/54  Pulse 81  Resp 20  SpO2 97%  Physical Exam  Constitutional: He is oriented to person, place, and time. He appears well-developed and well-nourished.  HENT:  Right Ear: External ear normal.  Left Ear: External ear normal.  Mouth/Throat: Oropharynx is clear and moist.       Abrasion to posterior scalp.  Small skin tear with underlying 1cm hematoma to right maxilla.  No bony tenderness.  EOMI  Eyes: Conjunctivae and EOM are normal. Pupils are equal, round, and reactive to light.  Neck: Normal range of motion. Neck supple.  Cardiovascular: Normal rate, regular rhythm and normal heart sounds.  Exam reveals no gallop and no friction rub.   No murmur heard. Pulmonary/Chest: Effort normal and breath sounds normal. No respiratory distress. He has no wheezes.  Abdominal: Soft. Bowel sounds are normal. He exhibits no distension. There is no tenderness. There is no rebound and no guarding.  Musculoskeletal: Normal range of motion.       Mild pain to right shoulder.  No other pain to palpation or ROM.  Has hematoma to right thigh, but no pain with ROM of leg.  NV intact distally.  Skin tear to right hand but no bony tenderness.  Neurological: He is alert and oriented to person, place, and time. He has normal strength. No cranial nerve deficit or sensory deficit. Coordination normal.  Skin: Skin is warm and dry. No rash noted. No pallor.    ED Course  Procedures (including critical care time)  Labs Reviewed  BASIC METABOLIC PANEL - Abnormal; Notable for the following:    Potassium 5.3 (*)    Glucose, Bld 165 (*)    BUN 36 (*)    Creatinine, Ser 1.80 (*)    GFR calc non Af Amer 32 (*)    GFR calc Af Amer 37 (*)    All other components within normal limits  CBC - Abnormal; Notable for the following:    WBC 11.5 (*)    Platelets 134 (*)    All other components within normal limits  APTT  PROTIME-INR   Dg Shoulder  Right  05/15/2011  *RADIOLOGY REPORT*  Clinical Data: 77 year old male status post fall with pain.  RIGHT SHOULDER - 2+ VIEW  Comparison: 10/07/2010.  Findings: Right AC joint separation re-identified with one full shaft width of caudal displacement of the acromion relative to the clavicle.  There is new heterotopic/dystrophic calcification between the distal clavicle and acromion April.  The right clavicle remains intact.  The proximal right humerus appears intact. No glenohumeral joint dislocation.  Visualized right ribs and lung parenchyma are within normal limits.  IMPRESSION: 1.  Continued/chronic right AC joint separation with post traumatic calcification which has developed since April. 2.  No superimposed acute fracture or dislocation identified at the right shoulder.  Original Report Authenticated By: Harley Hallmark, M.D.   Ct Head Wo Contrast  05/15/2011  *RADIOLOGY REPORT*  Clinical Data:  77 year old male status post fall with laceration, loss of consciousness, pain.  CT HEAD WITHOUT CONTRAST CT CERVICAL SPINE WITHOUT CONTRAST  Technique:  Multidetector CT imaging of the head and cervical spine was performed following the standard protocol without intravenous contrast.  Multiplanar CT image reconstructions of the cervical spine were also generated.  Comparison:  Cervical MRI 02/13/2010.  CT HEAD  Findings: Visualized paranasal sinuses and mastoids are clear. Postoperative changes to the globes. Calcified atherosclerosis at the skull base.  Left posterior scalp laceration and hematoma. Underlying calvarium intact.  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  Generalized volume loss.  No ventriculomegaly.  Patchy cerebral white matter hypodensity. No evidence of cortically based acute infarction identified.  Trace para falcine subdural hematoma on the left (image 24).  Small volume of subdural blood along the left tentorium (image 12).  No other acute intracranial hemorrhage.  IMPRESSION: 1.   Small volume left subdural hematoma seen along the falx and tentorium.  No mass effect. 2.  No other acute traumatic injury to the brain. 3.  Posterior scalp hematoma without underlying fracture. 4.  Cervical findings are below.  CT CERVICAL SPINE  Findings: Visualized paraspinal soft tissues are within normal limits.  Retropharyngeal course of the carotid incidentally noted.  Chronic bulky anterior cervical endplate spurring with ankylosis from the C3 to the C6 level.  Chronic disc degeneration and spinal stenosis at C2-C3.  Chronic disc degeneration which might also result in spinal stenosis at C6-C7.  Visualized skull base is intact.  No atlanto-occipital dissociation.  Advanced upper cervical facet degeneration, vacuum facet phenomenon on the right at C2-C3. Cervicothoracic junction alignment is within normal limits.  Bilateral posterior element alignment is within normal limits.  No acute cervical fracture. Upper thoracic levels appear grossly intact.  IMPRESSION:  1. No acute fracture or listhesis identified in the cervical spine. Ligamentous injury is not excluded. 2.  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  Subsequent advanced degenerative changes with chronic spinal stenosis at C2-C3 and C6- C7.  Critical Value/emergent results were called by telephone at the time of interpretation on 05/15/2011  at 1812 hours  to  RN Hetty Blend, who verbally acknowledged these results.  Original Report Authenticated By: Harley Hallmark, M.D.   Ct Cervical Spine Wo Contrast  05/15/2011  *RADIOLOGY REPORT*  Clinical Data:  76 year old male status post fall with laceration, loss of consciousness, pain.  CT HEAD WITHOUT CONTRAST CT CERVICAL SPINE WITHOUT CONTRAST  Technique:  Multidetector CT imaging of the head and cervical spine was performed following the standard protocol without intravenous contrast.  Multiplanar CT image reconstructions of the cervical spine were also generated.  Comparison:  Cervical MRI  02/13/2010.  CT HEAD  Findings: Visualized paranasal sinuses and mastoids are clear. Postoperative changes to the globes. Calcified atherosclerosis at the skull base.  Left posterior scalp laceration and hematoma. Underlying calvarium intact.  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  Generalized volume loss.  No ventriculomegaly.  Patchy cerebral white matter hypodensity. No  evidence of cortically based acute infarction identified.  Trace para falcine subdural hematoma on the left (image 24).  Small volume of subdural blood along the left tentorium (image 12).  No other acute intracranial hemorrhage.  IMPRESSION: 1.  Small volume left subdural hematoma seen along the falx and tentorium.  No mass effect. 2.  No other acute traumatic injury to the brain. 3.  Posterior scalp hematoma without underlying fracture. 4.  Cervical findings are below.  CT CERVICAL SPINE  Findings: Visualized paraspinal soft tissues are within normal limits.  Retropharyngeal course of the carotid incidentally noted.  Chronic bulky anterior cervical endplate spurring with ankylosis from the C3 to the C6 level.  Chronic disc degeneration and spinal stenosis at C2-C3.  Chronic disc degeneration which might also result in spinal stenosis at C6-C7.  Visualized skull base is intact.  No atlanto-occipital dissociation.  Advanced upper cervical facet degeneration, vacuum facet phenomenon on the right at C2-C3. Cervicothoracic junction alignment is within normal limits.  Bilateral posterior element alignment is within normal limits.  No acute cervical fracture. Upper thoracic levels appear grossly intact.  IMPRESSION:  1. No acute fracture or listhesis identified in the cervical spine. Ligamentous injury is not excluded. 2.  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  Subsequent advanced degenerative changes with chronic spinal stenosis at C2-C3 and C6- C7.  Critical Value/emergent results were called by telephone at the time of interpretation on  05/15/2011  at 1812 hours  to  RN Hetty Blend, who verbally acknowledged these results.  Original Report Authenticated By: Harley Hallmark, M.D.     1. Subdural hematoma      Date: 05/15/2011  Rate: 78  Rhythm: normal sinus rhythm  QRS Axis: normal  Intervals: PR prolonged  ST/T Wave abnormalities: normal  Conduction Disutrbances:none  Narrative Interpretation: q waves inferiorly  Old EKG Reviewed: none available  Results for orders placed during the hospital encounter of 05/15/11  BASIC METABOLIC PANEL      Component Value Range   Sodium 138  135 - 145 (mEq/L)   Potassium 5.3 (*) 3.5 - 5.1 (mEq/L)   Chloride 103  96 - 112 (mEq/L)   CO2 27  19 - 32 (mEq/L)   Glucose, Bld 165 (*) 70 - 99 (mg/dL)   BUN 36 (*) 6 - 23 (mg/dL)   Creatinine, Ser 4.09 (*) 0.50 - 1.35 (mg/dL)   Calcium 9.0  8.4 - 81.1 (mg/dL)   GFR calc non Af Amer 32 (*) >90 (mL/min)   GFR calc Af Amer 37 (*) >90 (mL/min)  APTT      Component Value Range   aPTT 35  24 - 37 (seconds)  PROTIME-INR      Component Value Range   Prothrombin Time 13.9  11.6 - 15.2 (seconds)   INR 1.05  0.00 - 1.49   CBC      Component Value Range   WBC 11.5 (*) 4.0 - 10.5 (K/uL)   RBC 4.33  4.22 - 5.81 (MIL/uL)   Hemoglobin 13.3  13.0 - 17.0 (g/dL)   HCT 91.4  78.2 - 95.6 (%)   MCV 93.3  78.0 - 100.0 (fL)   MCH 30.7  26.0 - 34.0 (pg)   MCHC 32.9  30.0 - 36.0 (g/dL)   RDW 21.3  08.6 - 57.8 (%)   Platelets 134 (*) 150 - 400 (K/uL)   Dg Shoulder Right  05/15/2011  *RADIOLOGY REPORT*  Clinical Data: 77 year old male status post fall with pain.  RIGHT  SHOULDER - 2+ VIEW  Comparison: 10/07/2010.  Findings: Right AC joint separation re-identified with one full shaft width of caudal displacement of the acromion relative to the clavicle.  There is new heterotopic/dystrophic calcification between the distal clavicle and acromion April.  The right clavicle remains intact.  The proximal right humerus appears intact. No glenohumeral joint  dislocation.  Visualized right ribs and lung parenchyma are within normal limits.  IMPRESSION: 1.  Continued/chronic right AC joint separation with post traumatic calcification which has developed since April. 2.  No superimposed acute fracture or dislocation identified at the right shoulder.  Original Report Authenticated By: Harley Hallmark, M.D.   Ct Head Wo Contrast  05/15/2011  *RADIOLOGY REPORT*  Clinical Data:  77 year old male status post fall with laceration, loss of consciousness, pain.  CT HEAD WITHOUT CONTRAST CT CERVICAL SPINE WITHOUT CONTRAST  Technique:  Multidetector CT imaging of the head and cervical spine was performed following the standard protocol without intravenous contrast.  Multiplanar CT image reconstructions of the cervical spine were also generated.  Comparison:  Cervical MRI 02/13/2010.  CT HEAD  Findings: Visualized paranasal sinuses and mastoids are clear. Postoperative changes to the globes. Calcified atherosclerosis at the skull base.  Left posterior scalp laceration and hematoma. Underlying calvarium intact.  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  Generalized volume loss.  No ventriculomegaly.  Patchy cerebral white matter hypodensity. No evidence of cortically based acute infarction identified.  Trace para falcine subdural hematoma on the left (image 24).  Small volume of subdural blood along the left tentorium (image 12).  No other acute intracranial hemorrhage.  IMPRESSION: 1.  Small volume left subdural hematoma seen along the falx and tentorium.  No mass effect. 2.  No other acute traumatic injury to the brain. 3.  Posterior scalp hematoma without underlying fracture. 4.  Cervical findings are below.  CT CERVICAL SPINE  Findings: Visualized paraspinal soft tissues are within normal limits.  Retropharyngeal course of the carotid incidentally noted.  Chronic bulky anterior cervical endplate spurring with ankylosis from the C3 to the C6 level.  Chronic disc  degeneration and spinal stenosis at C2-C3.  Chronic disc degeneration which might also result in spinal stenosis at C6-C7.  Visualized skull base is intact.  No atlanto-occipital dissociation.  Advanced upper cervical facet degeneration, vacuum facet phenomenon on the right at C2-C3. Cervicothoracic junction alignment is within normal limits.  Bilateral posterior element alignment is within normal limits.  No acute cervical fracture. Upper thoracic levels appear grossly intact.  IMPRESSION:  1. No acute fracture or listhesis identified in the cervical spine. Ligamentous injury is not excluded. 2.  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  Subsequent advanced degenerative changes with chronic spinal stenosis at C2-C3 and C6- C7.  Critical Value/emergent results were called by telephone at the time of interpretation on 05/15/2011  at 1812 hours  to  RN Hetty Blend, who verbally acknowledged these results.  Original Report Authenticated By: Harley Hallmark, M.D.   Ct Cervical Spine Wo Contrast  05/15/2011  *RADIOLOGY REPORT*  Clinical Data:  77 year old male status post fall with laceration, loss of consciousness, pain.  CT HEAD WITHOUT CONTRAST CT CERVICAL SPINE WITHOUT CONTRAST  Technique:  Multidetector CT imaging of the head and cervical spine was performed following the standard protocol without intravenous contrast.  Multiplanar CT image reconstructions of the cervical spine were also generated.  Comparison:  Cervical MRI 02/13/2010.  CT HEAD  Findings: Visualized paranasal sinuses and mastoids are clear.  Postoperative changes to the globes. Calcified atherosclerosis at the skull base.  Left posterior scalp laceration and hematoma. Underlying calvarium intact.  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  Generalized volume loss.  No ventriculomegaly.  Patchy cerebral white matter hypodensity. No evidence of cortically based acute infarction identified.  Trace para falcine subdural hematoma on the left  (image 24).  Small volume of subdural blood along the left tentorium (image 12).  No other acute intracranial hemorrhage.  IMPRESSION: 1.  Small volume left subdural hematoma seen along the falx and tentorium.  No mass effect. 2.  No other acute traumatic injury to the brain. 3.  Posterior scalp hematoma without underlying fracture. 4.  Cervical findings are below.  CT CERVICAL SPINE  Findings: Visualized paraspinal soft tissues are within normal limits.  Retropharyngeal course of the carotid incidentally noted.  Chronic bulky anterior cervical endplate spurring with ankylosis from the C3 to the C6 level.  Chronic disc degeneration and spinal stenosis at C2-C3.  Chronic disc degeneration which might also result in spinal stenosis at C6-C7.  Visualized skull base is intact.  No atlanto-occipital dissociation.  Advanced upper cervical facet degeneration, vacuum facet phenomenon on the right at C2-C3. Cervicothoracic junction alignment is within normal limits.  Bilateral posterior element alignment is within normal limits.  No acute cervical fracture. Upper thoracic levels appear grossly intact.  IMPRESSION:  1. No acute fracture or listhesis identified in the cervical spine. Ligamentous injury is not excluded. 2.  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  Subsequent advanced degenerative changes with chronic spinal stenosis at C2-C3 and C6- C7.  Critical Value/emergent results were called by telephone at the time of interpretation on 05/15/2011  at 1812 hours  to  RN Hetty Blend, who verbally acknowledged these results.  Original Report Authenticated By: Harley Hallmark, M.D.      MDM  Appears to be a mechanical fall.  No CP/SOB or preceeding symptoms, although did have some mild dizziness earlier in the day.  CT shows small SDH.  Pt will need to be admitted for obs, bloodwork ordered.  Will consult neurosurgery.  Pt does not appear to have any wounds that need suturing.  TDAP UTD.   Discussed with  neurosurgeon who requests admission by hospitalist.  Discussed with hospitalist who will accept pt for transfer        Rolan Bucco, MD 05/15/11 1919  Rolan Bucco, MD 05/15/11 Ernestina [place]

## 2011-05-15 NOTE — ED Notes (Signed)
Pt lives at [facility] independently. Pt was at family members house and fell hitting back of head and pt sts "I think I blacked out". Pt also injured right cheek and eye, right wrist and right hip. Pt denies pain at present.

## 2011-05-16 ENCOUNTER — Inpatient Hospital Stay (HOSPITAL_COMMUNITY): Payer: Medicare Other

## 2011-05-16 DIAGNOSIS — I059 Rheumatic mitral valve disease, unspecified: Secondary | ICD-10-CM

## 2011-05-16 DIAGNOSIS — R55 Syncope and collapse: Secondary | ICD-10-CM

## 2011-05-16 LAB — CBC
HCT: 36.1 % — ABNORMAL LOW (ref 39.0–52.0)
Hemoglobin: 11.7 g/dL — ABNORMAL LOW (ref 13.0–17.0)
MCH: 30.2 pg (ref 26.0–34.0)
MCHC: 32.4 g/dL (ref 30.0–36.0)
MCV: 93.3 fL (ref 78.0–100.0)
RBC: 3.87 MIL/uL — ABNORMAL LOW (ref 4.22–5.81)

## 2011-05-16 LAB — MRSA PCR SCREENING: MRSA by PCR: NEGATIVE

## 2011-05-16 LAB — CARDIAC PANEL(CRET KIN+CKTOT+MB+TROPI)
CK, MB: 3.8 ng/mL (ref 0.3–4.0)
CK, MB: 3 ng/mL (ref 0.3–4.0)
Relative Index: 2.8 — ABNORMAL HIGH (ref 0.0–2.5)
Relative Index: 3.6 — ABNORMAL HIGH (ref 0.0–2.5)
Total CK: 101 U/L (ref 7–232)
Total CK: 136 U/L (ref 7–232)

## 2011-05-16 LAB — COMPREHENSIVE METABOLIC PANEL
ALT: 16 U/L (ref 0–53)
AST: 19 U/L (ref 0–37)
Albumin: 3.2 g/dL — ABNORMAL LOW (ref 3.5–5.2)
CO2: 24 mEq/L (ref 19–32)
Calcium: 8.5 mg/dL (ref 8.4–10.5)
Creatinine, Ser: 1.59 mg/dL — ABNORMAL HIGH (ref 0.50–1.35)
Sodium: 138 mEq/L (ref 135–145)
Total Protein: 5.6 g/dL — ABNORMAL LOW (ref 6.0–8.3)

## 2011-05-16 LAB — GLUCOSE, CAPILLARY
Glucose-Capillary: 105 mg/dL — ABNORMAL HIGH (ref 70–99)
Glucose-Capillary: 140 mg/dL — ABNORMAL HIGH (ref 70–99)
Glucose-Capillary: 186 mg/dL — ABNORMAL HIGH (ref 70–99)
Glucose-Capillary: 226 mg/dL — ABNORMAL HIGH (ref 70–99)

## 2011-05-16 LAB — TSH: TSH: 1.835 u[IU]/mL (ref 0.350–4.500)

## 2011-05-16 MED ORDER — NITROGLYCERIN 0.4 MG/HR TD PT24
0.8000 mg | MEDICATED_PATCH | Freq: Every day | TRANSDERMAL | Status: DC
  Administered 2011-05-16 – 2011-05-19 (×4): 0.8 mg via TRANSDERMAL
  Filled 2011-05-16 (×4): qty 2

## 2011-05-16 NOTE — Progress Notes (Signed)
  Echocardiogram 2D Echocardiogram has been performed.  Juanita Laster Zuriyah Shatz, RDCS 05/16/2011, 11:42 AM

## 2011-05-16 NOTE — Progress Notes (Signed)
Subjective: No specific complaints by the patient himself  Objective: Weight change:   Intake/Output Summary (Last 24 hours) at 05/16/11 1436 Last data filed at 05/16/11 0800  Gross per 24 hour  Intake 293.33 ml  Output    925 ml  Net -631.67 ml   Blood pressure 146/65, pulse 74, temperature 97.3 F (36.3 C), temperature source Oral, resp. rate 15, height 5\' 9"  (1.753 m), weight 76.9 kg (169 lb 8.5 oz), SpO2 100.00%.  Physical Exam: General: Alert and awake, oriented, not in any acute distress, chronic skin changes on the lower eyelid, right eye bruise on the right side of the face and occipital area of the scalp. HEENT: anicteric sclera, pupils reactive to light and accommodation, EOMI CVS: S1-S2 clear, no murmur rubs or gallops Chest: clear to auscultation bilaterally, no wheezing, rales or rhonchi Abdomen: soft nontender, nondistended, normal bowel sounds, no organomegaly Extremities: no cyanosis, clubbing or edema noted bilaterally Neuro: Cranial nerves II-XII intact, no focal neurological deficits  Lab Results: Basic Metabolic Panel:  Lab 05/16/11 8119 05/15/11 1827  NA 138 138  K 4.2 5.3*  CL 105 103  CO2 24 27  GLUCOSE 111* 165*  BUN 32* 36*  CREATININE 1.59* 1.80*  CALCIUM 8.5 9.0  MG 2.3 --  PHOS -- --   Liver Function Tests:  Lab 05/16/11 0400  AST 19  ALT 16  ALKPHOS 70  BILITOT 0.5  PROT 5.6*  ALBUMIN 3.2*   CBC:  Lab 05/16/11 0400 05/15/11 1827  WBC 7.7 11.5*  NEUTROABS -- --  HGB 11.7* 13.3  HCT 36.1* 40.4  MCV 93.3 93.3  PLT 130* 134*   Cardiac Enzymes:  Lab 05/16/11 0905 05/15/11 2351  CKTOTAL 136 101  CKMB 3.8 3.6  CKMBINDEX -- --  TROPONINI <0.30 <0.30   CBG:  Lab 05/16/11 1222 05/16/11 0754 05/15/11 2036  GLUCAP 140* 105* 155*     Micro Results: Recent Results (from the past 240 hour(s))  MRSA PCR SCREENING     Status: Normal   Collection Time   05/16/11 12:43 AM      Component Value Range Status Comment   MRSA by PCR  NEGATIVE  NEGATIVE  Final     Studies/Results: Dg Shoulder Right  05/15/2011  *RADIOLOGY REPORT*  Clinical Data: 77 year old male status post fall with pain.  RIGHT SHOULDER - 2+ VIEW  Comparison: 10/07/2010.  Findings: Right AC joint separation re-identified with one full shaft width of caudal displacement of the acromion relative to the clavicle.  There is new heterotopic/dystrophic calcification between the distal clavicle and acromion April.  The right clavicle remains intact.  The proximal right humerus appears intact. No glenohumeral joint dislocation.  Visualized right ribs and lung parenchyma are within normal limits.  IMPRESSION: 1.  Continued/chronic right AC joint separation with post traumatic calcification which has developed since April. 2.  No superimposed acute fracture or dislocation identified at the right shoulder.  Original Report Authenticated By: Harley Hallmark, M.D.   Ct Head Wo Contrast  05/16/2011  *RADIOLOGY REPORT*  Clinical Data: follow up subdural hematoma  CT HEAD WITHOUT CONTRAST  Technique:  Contiguous axial images were obtained from the base of the skull through the vertex without contrast.  Comparison: 05/15/2011  Findings:  Para falcine and tentorial subdural hematomas are again identified. These appears similar in volume to the previous exam.  There is diffuse patchy low density throughout the subcortical and periventricular white matter consistent with chronic small vessel ischemic change.  There is prominence of the sulci and ventricles consistent with brain atrophy.  No new findings identified.  The paranasal sinuses and mastoid air cells are clear.  The skull is intact.  IMPRESSION:  1.  Stable appearance of para falcine and tentorial hematomas.  Original Report Authenticated By: Rosealee Albee, M.D.   Ct Head Wo Contrast  05/15/2011  *RADIOLOGY REPORT*  Clinical Data:  77 year old male status post fall with laceration, loss of consciousness, pain.  CT HEAD  WITHOUT CONTRAST CT CERVICAL SPINE WITHOUT CONTRAST  Technique:  Multidetector CT imaging of the head and cervical spine was performed following the standard protocol without intravenous contrast.  Multiplanar CT image reconstructions of the cervical spine were also generated.  Comparison:  Cervical MRI 02/13/2010.  CT HEAD  Findings: Visualized paranasal sinuses and mastoids are clear. Postoperative changes to the globes. Calcified atherosclerosis at the skull base.  Left posterior scalp laceration and hematoma. Underlying calvarium intact.  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  Generalized volume loss.  No ventriculomegaly.  Patchy cerebral white matter hypodensity. No evidence of cortically based acute infarction identified.  Trace para falcine subdural hematoma on the left (image 24).  Small volume of subdural blood along the left tentorium (image 12).  No other acute intracranial hemorrhage.  IMPRESSION: 1.  Small volume left subdural hematoma seen along the falx and tentorium.  No mass effect. 2.  No other acute traumatic injury to the brain. 3.  Posterior scalp hematoma without underlying fracture. 4.  Cervical findings are below.  CT CERVICAL SPINE  Findings: Visualized paraspinal soft tissues are within normal limits.  Retropharyngeal course of the carotid incidentally noted.  Chronic bulky anterior cervical endplate spurring with ankylosis from the C3 to the C6 level.  Chronic disc degeneration and spinal stenosis at C2-C3.  Chronic disc degeneration which might also result in spinal stenosis at C6-C7.  Visualized skull base is intact.  No atlanto-occipital dissociation.  Advanced upper cervical facet degeneration, vacuum facet phenomenon on the right at C2-C3. Cervicothoracic junction alignment is within normal limits.  Bilateral posterior element alignment is within normal limits.  No acute cervical fracture. Upper thoracic levels appear grossly intact.  IMPRESSION:  1. No acute fracture or  listhesis identified in the cervical spine. Ligamentous injury is not excluded. 2.  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  Subsequent advanced degenerative changes with chronic spinal stenosis at C2-C3 and C6- C7.  Critical Value/emergent results were called by telephone at the time of interpretation on 05/15/2011  at 1812 hours  to  RN Hetty Blend, who verbally acknowledged these results.  Original Report Authenticated By: Harley Hallmark, M.D.   Ct Cervical Spine Wo Contrast  05/15/2011  *RADIOLOGY REPORT*  Clinical Data:  77 year old male status post fall with laceration, loss of consciousness, pain.  CT HEAD WITHOUT CONTRAST CT CERVICAL SPINE WITHOUT CONTRAST  Technique:  Multidetector CT imaging of the head and cervical spine was performed following the standard protocol without intravenous contrast.  Multiplanar CT image reconstructions of the cervical spine were also generated.  Comparison:  Cervical MRI 02/13/2010.  CT HEAD  Findings: Visualized paranasal sinuses and mastoids are clear. Postoperative changes to the globes. Calcified atherosclerosis at the skull base.  Left posterior scalp laceration and hematoma. Underlying calvarium intact.  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  Generalized volume loss.  No ventriculomegaly.  Patchy cerebral white matter hypodensity. No evidence of cortically based acute infarction identified.  Trace para falcine subdural hematoma  on the left (image 24).  Small volume of subdural blood along the left tentorium (image 12).  No other acute intracranial hemorrhage.  IMPRESSION: 1.  Small volume left subdural hematoma seen along the falx and tentorium.  No mass effect. 2.  No other acute traumatic injury to the brain. 3.  Posterior scalp hematoma without underlying fracture. 4.  Cervical findings are below.  CT CERVICAL SPINE  Findings: Visualized paraspinal soft tissues are within normal limits.  Retropharyngeal course of the carotid incidentally noted.   Chronic bulky anterior cervical endplate spurring with ankylosis from the C3 to the C6 level.  Chronic disc degeneration and spinal stenosis at C2-C3.  Chronic disc degeneration which might also result in spinal stenosis at C6-C7.  Visualized skull base is intact.  No atlanto-occipital dissociation.  Advanced upper cervical facet degeneration, vacuum facet phenomenon on the right at C2-C3. Cervicothoracic junction alignment is within normal limits.  Bilateral posterior element alignment is within normal limits.  No acute cervical fracture. Upper thoracic levels appear grossly intact.  IMPRESSION:  1. No acute fracture or listhesis identified in the cervical spine. Ligamentous injury is not excluded. 2.  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  Subsequent advanced degenerative changes with chronic spinal stenosis at C2-C3 and C6- C7.  Critical Value/emergent results were called by telephone at the time of interpretation on 05/15/2011  at 1812 hours  to  RN Hetty Blend, who verbally acknowledged these results.  Original Report Authenticated By: Harley Hallmark, M.D.    Medications: Scheduled Meds:   . bisoprolol  5 mg Oral Daily  . celecoxib  200 mg Oral Daily  . cyanocobalamin  1,000 mcg Intramuscular Q30 days  . DULoxetine  60 mg Oral Daily  . famotidine  20 mg Oral Daily  . fluticasone  2 spray Each Nare Daily  . insulin aspart  0-9 Units Subcutaneous TID WC  . linagliptin  5 mg Oral Daily  . nitroGLYCERIN  0.8 mg Transdermal Daily  . omega-3 acid ethyl esters  1 g Oral Daily  . simvastatin  20 mg Oral Daily  . Tamsulosin HCl  0.4 mg Oral Daily  . tiotropium  18 mcg Inhalation Daily  . DISCONTD: nitroGLYCERIN  1 patch Transdermal Daily   Continuous Infusions:   . sodium chloride 50 mL/hr at 05/16/11 0800   PRN Meds:.acetaminophen, acetaminophen, diazepam, nitroGLYCERIN, ondansetron (ZOFRAN) IV, ondansetron  Assessment/Plan: Principal Problem:  *Subdural hematoma: - Status post fall,  repeat CT head has been obtained today which shows the stable appearance of para falcine and tentorial hematoma - Neurosurgery consulted, continue to hold aspirin and Plavix  Active Problems:  Diabetes mellitus: Blood sugars controlled, on sliding scale insulin and tradjenta   Hypertension: stable   Syncope: Continue 2-D echo and cardiac enzymes - PT OT eval   history of CAD status post CABG and stent: Follow 2-D echo   DVT prophylaxis bilateral SCDs  Disposition: continue to monitor in step-down unit today, transferred to telemetry floor tomorrow if stable     LOS: 1 day   RAI,RIPUDEEP 05/16/2011, 2:36 PM

## 2011-05-16 NOTE — Progress Notes (Signed)
Subjective: Patient reports Doing very well this morning per nurse reports as the patient had CT during this evaluation  Objective: Vital signs in last 24 hours: Temp:  [97.4 F (36.3 C)-97.9 F (36.6 C)] 97.5 F (36.4 C) (11/23 0800) Pulse Rate:  [70-81] 73  (11/23 0800) Resp:  [13-20] 13  (11/23 0800) BP: (94-119)/(47-67) 107/59 mmHg (11/23 0800) SpO2:  [95 %-99 %] 98 % (11/23 0800) Weight:  [76.9 kg (169 lb 8.5 oz)] 169 lb 8.5 oz (76.9 kg) (11/22 2226)  Intake/Output from previous day: 11/22 0701 - 11/23 0700 In: 293.3 [I.V.:293.3] Out: 400 [Urine:400] Intake/Output this shift: Total I/O In: -  Out: 525 [Urine:525]    Lab Results:  [facility] 05/16/11 0400 05/15/11 1827  WBC 7.7 11.5*  HGB 11.7* 13.3  HCT 36.1* 40.4  PLT 130* 134*   BMET  Basename 05/16/11 0400 05/15/11 1827  NA 138 138  K 4.2 5.3*  CL 105 103  CO2 24 27  GLUCOSE 111* 165*  BUN 32* 36*  CREATININE 1.59* 1.80*  CALCIUM 8.5 9.0    Studies/Results: Dg Shoulder Right  05/15/2011  *RADIOLOGY REPORT*  Clinical Data: 77 year old male status post fall with pain.  RIGHT SHOULDER - 2+ VIEW  Comparison: 10/07/2010.  Findings: Right AC joint separation re-identified with one full shaft width of caudal displacement of the acromion relative to the clavicle.  There is new heterotopic/dystrophic calcification between the distal clavicle and acromion April.  The right clavicle remains intact.  The proximal right humerus appears intact. No glenohumeral joint dislocation.  Visualized right ribs and lung parenchyma are within normal limits.  IMPRESSION: 1.  Continued/chronic right AC joint separation with post traumatic calcification which has developed since April. 2.  No superimposed acute fracture or dislocation identified at the right shoulder.  Original Report Authenticated By: Harley Hallmark, M.D.   Ct Head Wo Contrast  05/15/2011  *RADIOLOGY REPORT*  Clinical Data:  77 year old male status post fall with  laceration, loss of consciousness, pain.  CT HEAD WITHOUT CONTRAST CT CERVICAL SPINE WITHOUT CONTRAST  Technique:  Multidetector CT imaging of the head and cervical spine was performed following the standard protocol without intravenous contrast.  Multiplanar CT image reconstructions of the cervical spine were also generated.  Comparison:  Cervical MRI 02/13/2010.  CT HEAD  Findings: Visualized paranasal sinuses and mastoids are clear. Postoperative changes to the globes. Calcified atherosclerosis at the skull base.  Left posterior scalp laceration and hematoma. Underlying calvarium intact.  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  Generalized volume loss.  No ventriculomegaly.  Patchy cerebral white matter hypodensity. No evidence of cortically based acute infarction identified.  Trace para falcine subdural hematoma on the left (image 24).  Small volume of subdural blood along the left tentorium (image 12).  No other acute intracranial hemorrhage.  IMPRESSION: 1.  Small volume left subdural hematoma seen along the falx and tentorium.  No mass effect. 2.  No other acute traumatic injury to the brain. 3.  Posterior scalp hematoma without underlying fracture. 4.  Cervical findings are below.  CT CERVICAL SPINE  Findings: Visualized paraspinal soft tissues are within normal limits.  Retropharyngeal course of the carotid incidentally noted.  Chronic bulky anterior cervical endplate spurring with ankylosis from the C3 to the C6 level.  Chronic disc degeneration and spinal stenosis at C2-C3.  Chronic disc degeneration which might also result in spinal stenosis at C6-C7.  Visualized skull base is intact.  No atlanto-occipital dissociation.  Advanced upper  cervical facet degeneration, vacuum facet phenomenon on the right at C2-C3. Cervicothoracic junction alignment is within normal limits.  Bilateral posterior element alignment is within normal limits.  No acute cervical fracture. Upper thoracic levels appear  grossly intact.  IMPRESSION:  1. No acute fracture or listhesis identified in the cervical spine. Ligamentous injury is not excluded. 2.  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  Subsequent advanced degenerative changes with chronic spinal stenosis at C2-C3 and C6- C7.  Critical Value/emergent results were called by telephone at the time of interpretation on 05/15/2011  at 1812 hours  to  RN Hetty Blend, who verbally acknowledged these results.  Original Report Authenticated By: Harley Hallmark, M.D.   Ct Cervical Spine Wo Contrast  05/15/2011  *RADIOLOGY REPORT*  Clinical Data:  77 year old male status post fall with laceration, loss of consciousness, pain.  CT HEAD WITHOUT CONTRAST CT CERVICAL SPINE WITHOUT CONTRAST  Technique:  Multidetector CT imaging of the head and cervical spine was performed following the standard protocol without intravenous contrast.  Multiplanar CT image reconstructions of the cervical spine were also generated.  Comparison:  Cervical MRI 02/13/2010.  CT HEAD  Findings: Visualized paranasal sinuses and mastoids are clear. Postoperative changes to the globes. Calcified atherosclerosis at the skull base.  Left posterior scalp laceration and hematoma. Underlying calvarium intact.  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  Generalized volume loss.  No ventriculomegaly.  Patchy cerebral white matter hypodensity. No evidence of cortically based acute infarction identified.  Trace para falcine subdural hematoma on the left (image 24).  Small volume of subdural blood along the left tentorium (image 12).  No other acute intracranial hemorrhage.  IMPRESSION: 1.  Small volume left subdural hematoma seen along the falx and tentorium.  No mass effect. 2.  No other acute traumatic injury to the brain. 3.  Posterior scalp hematoma without underlying fracture. 4.  Cervical findings are below.  CT CERVICAL SPINE  Findings: Visualized paraspinal soft tissues are within normal limits.   Retropharyngeal course of the carotid incidentally noted.  Chronic bulky anterior cervical endplate spurring with ankylosis from the C3 to the C6 level.  Chronic disc degeneration and spinal stenosis at C2-C3.  Chronic disc degeneration which might also result in spinal stenosis at C6-C7.  Visualized skull base is intact.  No atlanto-occipital dissociation.  Advanced upper cervical facet degeneration, vacuum facet phenomenon on the right at C2-C3. Cervicothoracic junction alignment is within normal limits.  Bilateral posterior element alignment is within normal limits.  No acute cervical fracture. Upper thoracic levels appear grossly intact.  IMPRESSION:  1. No acute fracture or listhesis identified in the cervical spine. Ligamentous injury is not excluded. 2.  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  Subsequent advanced degenerative changes with chronic spinal stenosis at C2-C3 and C6- C7.  Critical Value/emergent results were called by telephone at the time of interpretation on 05/15/2011  at 1812 hours  to  RN Hetty Blend, who verbally acknowledged these results.  Original Report Authenticated By: Harley Hallmark, M.D.    Assessment/Plan: Patient is 24 hours status post closed head injury with small falcine subdural, followup head CT is stable with no extension of his hemorrhage. Should the patient be cleared from the medical service is okay from my perspective and be discharged her scheduled followup in a couple weeks discharge with a closed head injury sheet and postconcussive syndrome sheet. I would recommend he stay off of his Plavix for the next 2-3 days and then  resume as previously.  LOS: 1 day     Atasha Colebank P 05/16/2011, 8:58 AM

## 2011-05-16 NOTE — Progress Notes (Signed)
Clinical social worker completed pt psychosocial assessment, please see shadow chart.  Pt lives independently and may need skilled nursing for short term rehab. Csw awaiting pt/ot evaluation. Pt and pt family open to short term rehab if needed.   Catha Gosselin, Theresia Majors  [phone] .05/16/2011 15:45

## 2011-05-16 NOTE — Progress Notes (Signed)
*  PRELIMINARY RESULTS*  Carotid Doppler has been performed. Bilateral:  No evidence of hemodynamically significant internal artery stenosis.        Vertebral artery flow is antegrade.       Farrel Demark 05/16/2011, 2:08 PM

## 2011-05-17 DIAGNOSIS — W19XXXA Unspecified fall, initial encounter: Secondary | ICD-10-CM

## 2011-05-17 LAB — CBC
MCV: 92.4 fL (ref 78.0–100.0)
Platelets: 138 10*3/uL — ABNORMAL LOW (ref 150–400)
RBC: 3.96 MIL/uL — ABNORMAL LOW (ref 4.22–5.81)
RDW: 13 % (ref 11.5–15.5)
WBC: 6 10*3/uL (ref 4.0–10.5)

## 2011-05-17 LAB — BASIC METABOLIC PANEL
Calcium: 8.5 mg/dL (ref 8.4–10.5)
Creatinine, Ser: 1.28 mg/dL (ref 0.50–1.35)
GFR calc Af Amer: 56 mL/min — ABNORMAL LOW (ref >90)
GFR calc non Af Amer: 49 mL/min — ABNORMAL LOW (ref >90)
Sodium: 138 mEq/L (ref 135–145)

## 2011-05-17 LAB — GLUCOSE, CAPILLARY
Glucose-Capillary: 137 mg/dL — ABNORMAL HIGH (ref 70–99)
Glucose-Capillary: 168 mg/dL — ABNORMAL HIGH (ref 70–99)

## 2011-05-17 MED ORDER — INSULIN ASPART 100 UNIT/ML ~~LOC~~ SOLN: 0.0000 [IU] | Freq: Every day | SUBCUTANEOUS | Status: DC

## 2011-05-17 MED ORDER — INSULIN ASPART 100 UNIT/ML ~~LOC~~ SOLN
0.0000 [IU] | Freq: Three times a day (TID) | SUBCUTANEOUS | Status: DC
  Administered 2011-05-17 (×2): 3 [IU] via SUBCUTANEOUS
  Administered 2011-05-18 (×2): 2 [IU] via SUBCUTANEOUS
  Administered 2011-05-18 – 2011-05-19 (×3): 3 [IU] via SUBCUTANEOUS
  Administered 2011-05-19: 5 [IU] via SUBCUTANEOUS

## 2011-05-17 NOTE — Progress Notes (Signed)
Report called to Velva Harman, to [phone], VSS transferred via wheelchair accompanied by nursing tech to [id number], daughter updated by Dr. Isidoro Donning via telephone on status, Berle Mull RN

## 2011-05-17 NOTE — Progress Notes (Signed)
Subjective: No specific complaints by the patient himself, no issues overnight  Objective: Weight change:   Intake/Output Summary (Last 24 hours) at 05/17/11 0920 Last data filed at 05/17/11 0600  Gross per 24 hour  Intake   1800 ml  Output    775 ml  Net   1025 ml   Blood pressure 135/64, pulse 66, temperature 98.5 F (36.9 C), temperature source Oral, resp. rate 16, height 5\' 9"  (1.753 m), weight 76.9 kg (169 lb 8.5 oz), SpO2 97.00%.  Physical Exam: General: Alert and awake, oriented, not in any acute distress, chronic skin changes on the lower eyelid, right eye bruise on the right side of the face and occipital area of the scalp. HEENT: anicteric sclera, pupils reactive to light and accommodation, EOMI CVS: S1-S2 clear, no murmur rubs or gallops Chest: clear to auscultation bilaterally, no wheezing, rales or rhonchi Abdomen: soft nontender, nondistended, normal bowel sounds, no organomegaly Extremities: no cyanosis, clubbing or edema noted bilaterally Neuro: Cranial nerves II-XII intact, no focal neurological deficits  Lab Results: Basic Metabolic Panel:  Lab 05/17/11 9604 05/16/11 0400  NA 138 138  K 4.2 4.2  CL 106 105  CO2 24 24  GLUCOSE 141* 111*  BUN 27* 32*  CREATININE 1.28 1.59*  CALCIUM 8.5 8.5  MG -- 2.3  PHOS -- --   Liver Function Tests:  Lab 05/16/11 0400  AST 19  ALT 16  ALKPHOS 70  BILITOT 0.5  PROT 5.6*  ALBUMIN 3.2*   CBC:  Lab 05/17/11 0650 05/16/11 0400  WBC 6.0 7.7  NEUTROABS -- --  HGB 12.0* 11.7*  HCT 36.6* 36.1*  MCV 92.4 93.3  PLT 138* 130*   Cardiac Enzymes:  Lab 05/16/11 1454 05/16/11 0905 05/15/11 2351  CKTOTAL 171 136 101  CKMB 3.0 3.8 3.6  CKMBINDEX -- -- --  TROPONINI <0.30 <0.30 <0.30   CBG:  Lab 05/17/11 0808 05/16/11 2141 05/16/11 1705 05/16/11 1222 05/16/11 0754  GLUCAP 137* 226* 186* 140* 105*     Micro Results: Recent Results (from the past 240 hour(s))  MRSA PCR SCREENING     Status: Normal   Collection Time   05/16/11 12:43 AM      Component Value Range Status Comment   MRSA by PCR NEGATIVE  NEGATIVE  Final     Studies/Results: Dg Shoulder Right  05/15/2011  *RADIOLOGY REPORT*  Clinical Data: 77 year old male status post fall with pain.  RIGHT SHOULDER - 2+ VIEW  Comparison: 10/07/2010.  Findings: Right AC joint separation re-identified with one full shaft width of caudal displacement of the acromion relative to the clavicle.  There is new heterotopic/dystrophic calcification between the distal clavicle and acromion April.  The right clavicle remains intact.  The proximal right humerus appears intact. No glenohumeral joint dislocation.  Visualized right ribs and lung parenchyma are within normal limits.  IMPRESSION: 1.  Continued/chronic right AC joint separation with post traumatic calcification which has developed since April. 2.  No superimposed acute fracture or dislocation identified at the right shoulder.  Original Report Authenticated By: Harley Hallmark, M.D.   Ct Head Wo Contrast  05/16/2011  *RADIOLOGY REPORT*  Clinical Data: follow up subdural hematoma  CT HEAD WITHOUT CONTRAST  Technique:  Contiguous axial images were obtained from the base of the skull through the vertex without contrast.  Comparison: 05/15/2011  Findings:  Para falcine and tentorial subdural hematomas are again identified. These appears similar in volume to the previous exam.  There is diffuse  patchy low density throughout the subcortical and periventricular white matter consistent with chronic small vessel ischemic change.  There is prominence of the sulci and ventricles consistent with brain atrophy.  No new findings identified.  The paranasal sinuses and mastoid air cells are clear.  The skull is intact.  IMPRESSION:  1.  Stable appearance of para falcine and tentorial hematomas.  Original Report Authenticated By: Rosealee Albee, M.D.   Ct Head Wo Contrast  05/15/2011  *RADIOLOGY REPORT*  Clinical  Data:  77 year old male status post fall with laceration, loss of consciousness, pain.  CT HEAD WITHOUT CONTRAST CT CERVICAL SPINE WITHOUT CONTRAST  Technique:  Multidetector CT imaging of the head and cervical spine was performed following the standard protocol without intravenous contrast.  Multiplanar CT image reconstructions of the cervical spine were also generated.  Comparison:  Cervical MRI 02/13/2010.  CT HEAD  Findings: Visualized paranasal sinuses and mastoids are clear. Postoperative changes to the globes. Calcified atherosclerosis at the skull base.  Left posterior scalp laceration and hematoma. Underlying calvarium intact.  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  Generalized volume loss.  No ventriculomegaly.  Patchy cerebral white matter hypodensity. No evidence of cortically based acute infarction identified.  Trace para falcine subdural hematoma on the left (image 24).  Small volume of subdural blood along the left tentorium (image 12).  No other acute intracranial hemorrhage.  IMPRESSION: 1.  Small volume left subdural hematoma seen along the falx and tentorium.  No mass effect. 2.  No other acute traumatic injury to the brain. 3.  Posterior scalp hematoma without underlying fracture. 4.  Cervical findings are below.  CT CERVICAL SPINE  Findings: Visualized paraspinal soft tissues are within normal limits.  Retropharyngeal course of the carotid incidentally noted.  Chronic bulky anterior cervical endplate spurring with ankylosis from the C3 to the C6 level.  Chronic disc degeneration and spinal stenosis at C2-C3.  Chronic disc degeneration which might also result in spinal stenosis at C6-C7.  Visualized skull base is intact.  No atlanto-occipital dissociation.  Advanced upper cervical facet degeneration, vacuum facet phenomenon on the right at C2-C3. Cervicothoracic junction alignment is within normal limits.  Bilateral posterior element alignment is within normal limits.  No acute cervical  fracture. Upper thoracic levels appear grossly intact.  IMPRESSION:  1. No acute fracture or listhesis identified in the cervical spine. Ligamentous injury is not excluded. 2.  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  Subsequent advanced degenerative changes with chronic spinal stenosis at C2-C3 and C6- C7.  Critical Value/emergent results were called by telephone at the time of interpretation on 05/15/2011  at 1812 hours  to  RN Hetty Blend, who verbally acknowledged these results.  Original Report Authenticated By: Harley Hallmark, M.D.   Ct Cervical Spine Wo Contrast  05/15/2011  *RADIOLOGY REPORT*  Clinical Data:  77 year old male status post fall with laceration, loss of consciousness, pain.  CT HEAD WITHOUT CONTRAST CT CERVICAL SPINE WITHOUT CONTRAST  Technique:  Multidetector CT imaging of the head and cervical spine was performed following the standard protocol without intravenous contrast.  Multiplanar CT image reconstructions of the cervical spine were also generated.  Comparison:  Cervical MRI 02/13/2010.  CT HEAD  Findings: Visualized paranasal sinuses and mastoids are clear. Postoperative changes to the globes. Calcified atherosclerosis at the skull base.  Left posterior scalp laceration and hematoma. Underlying calvarium intact.  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  Generalized volume loss.  No ventriculomegaly.  Patchy  cerebral white matter hypodensity. No evidence of cortically based acute infarction identified.  Trace para falcine subdural hematoma on the left (image 24).  Small volume of subdural blood along the left tentorium (image 12).  No other acute intracranial hemorrhage.  IMPRESSION: 1.  Small volume left subdural hematoma seen along the falx and tentorium.  No mass effect. 2.  No other acute traumatic injury to the brain. 3.  Posterior scalp hematoma without underlying fracture. 4.  Cervical findings are below.  CT CERVICAL SPINE  Findings: Visualized paraspinal soft  tissues are within normal limits.  Retropharyngeal course of the carotid incidentally noted.  Chronic bulky anterior cervical endplate spurring with ankylosis from the C3 to the C6 level.  Chronic disc degeneration and spinal stenosis at C2-C3.  Chronic disc degeneration which might also result in spinal stenosis at C6-C7.  Visualized skull base is intact.  No atlanto-occipital dissociation.  Advanced upper cervical facet degeneration, vacuum facet phenomenon on the right at C2-C3. Cervicothoracic junction alignment is within normal limits.  Bilateral posterior element alignment is within normal limits.  No acute cervical fracture. Upper thoracic levels appear grossly intact.  IMPRESSION:  1. No acute fracture or listhesis identified in the cervical spine. Ligamentous injury is not excluded. 2.  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  Subsequent advanced degenerative changes with chronic spinal stenosis at C2-C3 and C6- C7.  Critical Value/emergent results were called by telephone at the time of interpretation on 05/15/2011  at 1812 hours  to  RN Hetty Blend, who verbally acknowledged these results.  Original Report Authenticated By: Harley Hallmark, M.D.    Medications: Scheduled Meds:    . bisoprolol  5 mg Oral Daily  . celecoxib  200 mg Oral Daily  . cyanocobalamin  1,000 mcg Intramuscular Q30 days  . DULoxetine  60 mg Oral Daily  . famotidine  20 mg Oral Daily  . fluticasone  2 spray Each Nare Daily  . insulin aspart  0-15 Units Subcutaneous TID WC  . insulin aspart  0-5 Units Subcutaneous QHS  . linagliptin  5 mg Oral Daily  . nitroGLYCERIN  0.8 mg Transdermal Daily  . omega-3 acid ethyl esters  1 g Oral Daily  . simvastatin  20 mg Oral Daily  . Tamsulosin HCl  0.4 mg Oral Daily  . tiotropium  18 mcg Inhalation Daily  . DISCONTD: insulin aspart  0-9 Units Subcutaneous TID WC   Continuous Infusions:    . DISCONTD: sodium chloride 50 mL/hr at 05/17/11 0600   PRN Meds:.acetaminophen,  acetaminophen, diazepam, nitroGLYCERIN, ondansetron (ZOFRAN) IV, ondansetron  Assessment/Plan: Principal Problem:  *Subdural hematoma secondary to fall: - Status post fall, repeat CT head done yesterday showed the stable appearance of para falcine and tentorial hematoma - Neurosurgery consulted, seen by Dr. Wynetta Emery, continue to hold aspirin and Plavix for next 2-3 days  Active Problems:  Diabetes mellitus: Blood sugars controlled, on sliding scale insulin and tradjenta   Hypertension: stable   Syncope:  - 2-D echocardiogram done, shows EF of 55-60%, normal wall motion, grade 1 diastolic dysfunction - PT OT eval pending   history of CAD status post CABG and stent: Asymptomatic, EF 55-60%   DVT prophylaxis bilateral SCDs  Disposition: Patient having recents falls and currently in independent living facility per patient, await PT OT evaluation, patient will likely need higher level of care at this point. Patient and family in agreement with short-term rehabilitation/SNF. Transfer to floor.     LOS: 2 days  RAI,RIPUDEEP 05/17/2011, 9:20 AM

## 2011-05-18 LAB — GLUCOSE, CAPILLARY
Glucose-Capillary: 133 mg/dL — ABNORMAL HIGH (ref 70–99)
Glucose-Capillary: 177 mg/dL — ABNORMAL HIGH (ref 70–99)
Glucose-Capillary: 188 mg/dL — ABNORMAL HIGH (ref 70–99)

## 2011-05-18 LAB — BASIC METABOLIC PANEL
BUN: 26 mg/dL — ABNORMAL HIGH (ref 6–23)
CO2: 26 mEq/L (ref 19–32)
Calcium: 8.7 mg/dL (ref 8.4–10.5)
Creatinine, Ser: 1.25 mg/dL (ref 0.50–1.35)
GFR calc Af Amer: 58 mL/min — ABNORMAL LOW (ref >90)

## 2011-05-18 LAB — CBC
MCHC: 33.3 g/dL (ref 30.0–36.0)
MCV: 92.4 fL (ref 78.0–100.0)
Platelets: 128 10*3/uL — ABNORMAL LOW (ref 150–400)
RDW: 13 % (ref 11.5–15.5)
WBC: 6.3 10*3/uL (ref 4.0–10.5)

## 2011-05-18 MED ORDER — CALCIUM CARBONATE-VITAMIN D 500-200 MG-UNIT PO TABS
1.0000 | ORAL_TABLET | Freq: Two times a day (BID) | ORAL | Status: DC
  Administered 2011-05-18 – 2011-05-19 (×3): 1 via ORAL
  Filled 2011-05-18 (×4): qty 1

## 2011-05-18 NOTE — Progress Notes (Signed)
Physical Therapy Evaluation Patient Details Name: Leon Sharp MRN: [id number] DOB: [birth year] Today's Date: 05/18/2011  Problem List:  Patient Active Problem List  Diagnoses  . Subdural hematoma  . Diabetes mellitus  . Hypertension  . Syncope  . Fall    Past Medical History:  Past Medical History  Diagnosis Date  . Diabetes mellitus   . High cholesterol   . Cancer   . Skin cancer   . Angina   . COPD (chronic obstructive pulmonary disease)   . Blood transfusion   . Coronary artery disease    Past Surgical History:  Past Surgical History  Procedure Date  . Cardiac surgery   . Cholecystectomy   . Coronary artery bypass graft   . Back surgery   . Colon surgery     PT Assessment/Plan/Recommendation PT Assessment Clinical Impression Statement: Patient is an 77 yo male admitted after fall with small SDH.  Patient moving fairly well, with slight decrease in balance.  Discussed with patient/daughter - feel patient can return to ILF if he uses his RW at all times, and receives HHPT.  Also recommended someone check on patient daily initially. PT Recommendation/Assessment: Patient will need skilled PT in the acute care venue PT Problem List: Decreased strength;Decreased balance;Decreased activity tolerance;Cardiopulmonary status limiting activity PT Therapy Diagnosis : Abnormality of gait PT Plan PT Frequency: Min 3X/week PT Treatment/Interventions: DME instruction;Gait training;Balance training;Patient/family education PT Recommendation Follow Up Recommendations: Home health PT Equipment Recommended: None recommended by PT PT Goals  Acute Rehab PT Goals PT Goal Formulation: With patient/family Time For Goal Achievement: 7 days Pt will go Supine/Side to Sit: Independently;with HOB 0 degrees PT Goal: Supine/Side to Sit - Progress: Other (comment) Pt will go Sit to Supine/Side: Independently;with HOB 0 degrees PT Goal: Sit to Supine/Side - Progress: Other (comment) Pt  will Transfer Sit to Stand/Stand to Sit: with modified independence;with upper extremity assist PT Transfer Goal: Sit to Stand/Stand to Sit - Progress: Other (comment) Pt will Ambulate: >150 feet;with modified independence;with rolling walker PT Goal: Ambulate - Progress: Other (comment)  PT Evaluation Precautions/Restrictions  Precautions Precautions: Fall Precaution Comments: Balance slightly decreased (in bed for several days and weak).  Encouraged patient to use RW at home. Required Braces or Orthoses: No Restrictions Weight Bearing Restrictions: No Prior Functioning  Home Living Lives With: Alone Type of Home: Independent living facility Home Layout: One level Home Access: Elevator Bathroom Shower/Tub: Engineer, manufacturing systems: Handicapped height Bathroom Accessibility: Yes How Accessible: Accessible via walker Home Adaptive Equipment: Straight cane;Walker - four wheeled Prior Function Level of Independence: Independent with basic ADLs;Independent with gait;Needs assistance with homemaking Meal Prep: Total (Provided at ILF) Light Housekeeping: Total (Provided at ILF) Driving: Yes (Minimally) Vocation: Retired Financial risk analyst Arousal/Alertness: Awake/alert Overall Cognitive Status: Appears within functional limits for tasks assessed Sensation/Coordination Sensation Light Touch: Impaired by gross assessment (Patient with peripheral neuropathy bil LE's) Extremity Assessment RUE Assessment RUE Assessment: Within Functional Limits LUE Assessment LUE Assessment: Within Functional Limits RLE Assessment RLE Assessment: Within Functional Limits LLE Assessment LLE Assessment: Within Functional Limits Mobility (including Balance) Bed Mobility Bed Mobility: Yes Supine to Sit: 4: Min assist;With rails Supine to Sit Details (indicate cue type and reason): Cues for easier transition Transfers Transfers: Yes Sit to Stand: 5: Supervision;With upper extremity  assist;From bed;From chair/3-in-1 Sit to Stand Details (indicate cue type and reason): Cues for hand placement, and to stand for several seconds before gait. Stand to Sit: 5: Supervision;With upper extremity assist;With armrests;To chair/3-in-1  Stand to Sit Details: Cues for hand placement Ambulation/Gait Ambulation/Gait: Yes Ambulation/Gait Assistance: 5: Supervision Ambulation/Gait Assistance Details (indicate cue type and reason): Patient with slight decrease in balance without assistive device.  Patient became lightheaded, requiring sitting rest break.  BP 86/49 after mobility - RN notified Ambulation Distance (Feet): 120 Feet Assistive device: None Gait Pattern: Step-through pattern;Decreased stride length;Trunk flexed Gait velocity: Slow gait speed Stairs: No  Posture/Postural Control Posture/Postural Control: No significant limitations Balance Balance Assessed: Yes Dynamic Standing Balance Dynamic Standing - Balance Support: No upper extremity supported Dynamic Standing - Level of Assistance: 5: Stand by assistance Dynamic Standing - Balance Activities: Reaching for objects;Other (comment) (changing gait speed; head turns; etc.) Dynamic Standing - Comments: Patient with slight decrease in balance without assistive device.  Recommend patient use his rolling walker at home   End of Session PT - End of Session Equipment Utilized During Treatment: Gait belt Activity Tolerance: Patient tolerated treatment well;Other (comment) (Decreased BP with gait) Patient left: in chair;with call bell in reach;with family/visitor present Nurse Communication: Mobility status for ambulation;Other (comment) (Decrease in BP) General Behavior During Session: [facility] for tasks performed Cognition: [facility] for tasks performed  Vena Austria [phone] 05/18/2011, 3:02 PM

## 2011-05-18 NOTE — Progress Notes (Signed)
Pt B/P 88/58 manual, pt c/o of mild dizziness that quickly resolved, MD notified, order to recheck B/P in one hour and notify physician if pt becomes symptomatic or B/P does not stabilize, f/u B/P 91/60, pt asymptomatic, will continue to monitor.

## 2011-05-18 NOTE — Progress Notes (Signed)
Subjective: No chest pain, no shortness of breath. No acute complaints or overnight events.  Objective: Vital signs in last 24 hours: Temp:  [97.4 F (36.3 C)-98.1 F (36.7 C)] 97.6 F (36.4 C) (11/25 0551) Pulse Rate:  [70-76] 70  (11/25 0551) Resp:  [13-18] 18  (11/25 0551) BP: (121-159)/(63-75) 121/63 mmHg (11/25 0551) SpO2:  [96 %-98 %] 96 % (11/25 1000) Weight:  [78.654 kg (173 lb 6.4 oz)] 173 lb 6.4 oz (78.654 kg) (11/24 1624) Weight change:  Last BM Date: 05/14/11  Intake/Output from previous day: 11/24 0701 - 11/25 0700 In: -  Out: 775 [Urine:775]     Physical Exam: General: Alert, awake, oriented x3, in no acute distress. HEENT: No bruits, no goiter. Heart: Regular rate and rhythm, without murmurs, rubs, gallops. Lungs: Clear to auscultation bilaterally. Abdomen: Soft, nontender, nondistended, positive bowel sounds. Extremities: No clubbing cyanosis or edema with positive pedal pulses. Neuro: Grossly intact, nonfocal.   Lab Results: Basic Metabolic Panel:  Basename 05/18/11 0705 05/17/11 0650 05/16/11 0400  NA 138 138 --  K 4.3 4.2 --  CL 104 106 --  CO2 26 24 --  GLUCOSE 141* 141* --  BUN 26* 27* --  CREATININE 1.25 1.28 --  CALCIUM 8.7 8.5 --  MG -- -- 2.3  PHOS -- -- --   Liver Function Tests:  Basename 05/16/11 0400  AST 19  ALT 16  ALKPHOS 70  BILITOT 0.5  PROT 5.6*  ALBUMIN 3.2*   CBC:  Basename 05/18/11 0705 05/17/11 0650  WBC 6.3 6.0  NEUTROABS -- --  HGB 12.9* 12.0*  HCT 38.7* 36.6*  MCV 92.4 92.4  PLT 128* 138*   Cardiac Enzymes:  Basename 05/16/11 1454 05/16/11 0905 05/15/11 2351  CKTOTAL 171 136 101  CKMB 3.0 3.8 3.6  CKMBINDEX -- -- --  TROPONINI <0.30 <0.30 <0.30   CBG:  Basename 05/18/11 0732 05/17/11 2041 05/17/11 1708 05/17/11 1215 05/17/11 0808 05/16/11 2141  GLUCAP 133* 172* 172* 168* 137* 226*   Thyroid Function Tests:  Basename 05/16/11 0400  TSH 1.835  T4TOTAL --  FREET4 --  T3FREE --  THYROIDAB --    Coagulation:  Basename 05/15/11 1827  LABPROT 13.9  INR 1.05    Misc. Labs:  Recent Results (from the past 240 hour(s))  MRSA PCR SCREENING     Status: Normal   Collection Time   05/16/11 12:43 AM      Component Value Range Status Comment   MRSA by PCR NEGATIVE  NEGATIVE  Final     Studies/Results: No results found.   Medications: Scheduled Meds:   . bisoprolol  5 mg Oral Daily  . calcium-vitamin D  1 tablet Oral BID  . celecoxib  200 mg Oral Daily  . cyanocobalamin  1,000 mcg Intramuscular Q30 days  . DULoxetine  60 mg Oral Daily  . famotidine  20 mg Oral Daily  . fluticasone  2 spray Each Nare Daily  . insulin aspart  0-15 Units Subcutaneous TID WC  . insulin aspart  0-5 Units Subcutaneous QHS  . linagliptin  5 mg Oral Daily  . nitroGLYCERIN  0.8 mg Transdermal Daily  . omega-3 acid ethyl esters  1 g Oral Daily  . simvastatin  20 mg Oral Daily  . Tamsulosin HCl  0.4 mg Oral Daily  . tiotropium  18 mcg Inhalation Daily   Continuous Infusions:  PRN Meds:.acetaminophen, acetaminophen, diazepam, nitroGLYCERIN, ondansetron (ZOFRAN) IV, ondansetron  Assessment/Plan: 1-Subdural hematoma: Is stable; at this point  no need of surgical intervention. We'll continue holding aspirin and Plavix for the next 3 days before restarting these medications. Patient will be evaluated by physical therapy/occupational therapy in order to determine safest discharge plans.  2-Diabetes mellitus: Stable; continue a sliding scale insulin, denied ear pain and CBG's qac/qhs.  3-Hypertension: At this point we'll continue current medications, patient's vital signs/blood pressure are stable and well controlled.  4-Fall: We'll follow PT/OT recommendations for discharge plans. Will check vitamin D level. Patient will be started on Os-Cal twice a day while waiting for results of vitamin D levels.  5-B12 deficiency: Continue monthly injections of vitamin B 12 (1000 mcg).  6-depression: Continue  Cymbalta.  7-BPH: Continue Flomax  8-hyperlipidemia: Continue statins and omega-3.  9-history of coronary artery disease: Transfer the patient to continue aspirin and Plavix after 3 more days of holding these medications due to his subdural hematoma. No abnormalities seen on telemetry, patient not complaining of any chest pain or shortness of breath. We'll continue the use of beta blocker and statins.  Disposition: PT/OT evaluation pending at this point, the patient medical stable to be transfer either to his independent living facility versus a skilled nurse facility.    LOS: 3 days   Leon Sharp 05/18/2011, 10:43 AM

## 2011-05-19 LAB — CBC
MCV: 92.8 fL (ref 78.0–100.0)
Platelets: 134 10*3/uL — ABNORMAL LOW (ref 150–400)
RBC: 4.3 MIL/uL (ref 4.22–5.81)
RDW: 13 % (ref 11.5–15.5)
WBC: 7.5 10*3/uL (ref 4.0–10.5)

## 2011-05-19 LAB — BASIC METABOLIC PANEL
CO2: 27 mEq/L (ref 19–32)
Calcium: 9.3 mg/dL (ref 8.4–10.5)
Creatinine, Ser: 1.28 mg/dL (ref 0.50–1.35)
GFR calc Af Amer: 56 mL/min — ABNORMAL LOW (ref >90)
GFR calc non Af Amer: 49 mL/min — ABNORMAL LOW (ref >90)
Sodium: 137 mEq/L (ref 135–145)

## 2011-05-19 LAB — GLUCOSE, CAPILLARY
Glucose-Capillary: 169 mg/dL — ABNORMAL HIGH (ref 70–99)
Glucose-Capillary: 177 mg/dL — ABNORMAL HIGH (ref 70–99)

## 2011-05-19 MED ORDER — CLOPIDOGREL BISULFATE 75 MG PO TABS: 75.0000 mg | ORAL_TABLET | Freq: Every day | ORAL | Status: AC

## 2011-05-19 MED ORDER — ASPIRIN 81 MG PO TABS: 81.0000 mg | ORAL_TABLET | Freq: Every day | ORAL | Status: DC

## 2011-05-19 MED ORDER — ACETAMINOPHEN 325 MG PO TABS
650.0000 mg | ORAL_TABLET | Freq: Four times a day (QID) | ORAL | Status: AC | PRN
Start: 2011-05-19 — End: 2011-05-29

## 2011-05-19 MED ORDER — CALCIUM CARBONATE-VITAMIN D 500-200 MG-UNIT PO TABS: 1.0000 | ORAL_TABLET | Freq: Two times a day (BID) | ORAL | Status: DC

## 2011-05-19 NOTE — Discharge Summary (Signed)
Physician Discharge Summary  Patient ID: Leon Sharp MRN: [id number] DOB/AGE: 09/05/1921 77 y.o.  Admit date: 05/15/2011 Discharge date: 05/19/2011  Primary Care Physician:  No primary provider on file.   Discharge Diagnoses:   1-Subdural hematoma 2-Diabetes mellitus  3-High cholesterol  4-Depression/anxiety 5-Skin cancer  6-Angina  7-COPD (chronic obstructive pulmonary disease)  8-Coronary artery disease (s/p CABG)  9-BPH  Current Discharge Medication List    START taking these medications   Details  acetaminophen (TYLENOL) 325 MG tablet Take 2 tablets (650 mg total) by mouth every 6 (six) hours as needed for pain or fever (or Fever >/= 101). Qty: 30 tablet    calcium-vitamin D (OSCAL WITH D) 500-200 MG-UNIT per tablet Take 1 tablet by mouth 2 (two) times daily. Qty: 60 tablet, Refills: 1      CONTINUE these medications which have CHANGED   Details  aspirin 81 MG tablet Take 1 tablet (81 mg total) by mouth daily. Start taking this medication again on 05/20/11    clopidogrel (PLAVIX) 75 MG tablet Take 1 tablet (75 mg total) by mouth daily.      CONTINUE these medications which have NOT CHANGED   Details  bisoprolol (ZEBETA) 5 MG tablet Take 5 mg by mouth daily.      cyanocobalamin (,VITAMIN B-12,) 1000 MCG/ML injection Inject 1,000 mcg into the muscle every 30 (thirty) days.     diazepam (VALIUM) 5 MG tablet Take 5 mg by mouth 2 (two) times daily as needed. For stomach    DULoxetine (CYMBALTA) 60 MG capsule Take 60 mg by mouth daily.      fluticasone (FLONASE) 50 MCG/ACT nasal spray Place 2 sprays into the nose daily.      nitroGLYCERIN (NITRO-DUR) 0.8 MG/HR Place 1 patch onto the skin daily.     nitroGLYCERIN (NITROSTAT) 0.4 MG SL tablet Place 0.4 mg under the tongue every 5 (five) minutes as needed. For chest pain      Omega-3 Fatty Acids (FISH OIL) 1200 MG CAPS Take 1 capsule by mouth daily.     ranitidine (ZANTAC) 300 MG tablet Take 300 mg by mouth  2 (two) times daily.      simvastatin (ZOCOR) 20 MG tablet Take 20 mg by mouth daily.      sitaGLIPtin (JANUVIA) 25 MG tablet Take 25 mg by mouth daily.      Tamsulosin HCl (FLOMAX) 0.4 MG CAPS Take 0.4 mg by mouth daily.      tiotropium (SPIRIVA) 18 MCG inhalation capsule Place 18 mcg into inhaler and inhale daily.        STOP taking these medications     celecoxib (CELEBREX) 200 MG capsule          Disposition and Follow-up:  Patient discharge in stable and improved condition. No Ha, no neurologic deficits and no acute complaints. Patient will follow discharge instructions and medications as prescribed. Celecoxib has been discontinue and patient plavix and aspirin will be resumed tomorrow 05/20/11. Patient has been advised to follow with PCP in 7-10 days.  Consults:   Neurosurgery  Significant Diagnostic Studies:  Dg Shoulder Right  05/15/2011  *RADIOLOGY REPORT*  Clinical Data: 77 year old male status post fall with pain.  RIGHT SHOULDER - 2+ VIEW  Comparison: 10/07/2010.  Findings: Right AC joint separation re-identified with one full shaft width of caudal displacement of the acromion relative to the clavicle.  There is new heterotopic/dystrophic calcification between the distal clavicle and acromion April.  The right clavicle remains intact.  The proximal right humerus appears intact. No glenohumeral joint dislocation.  Visualized right ribs and lung parenchyma are within normal limits.  IMPRESSION: 1.  Continued/chronic right AC joint separation with post traumatic calcification which has developed since April. 2.  No superimposed acute fracture or dislocation identified at the right shoulder.  Original Report Authenticated By: Harley Hallmark, M.D.   Ct Head Wo Contrast  05/16/2011  *RADIOLOGY REPORT*  Clinical Data: follow up subdural hematoma  CT HEAD WITHOUT CONTRAST  Technique:  Contiguous axial images were obtained from the base of the skull through the vertex without  contrast.  Comparison: 05/15/2011  Findings:  Para falcine and tentorial subdural hematomas are again identified. These appears similar in volume to the previous exam.  There is diffuse patchy low density throughout the subcortical and periventricular white matter consistent with chronic small vessel ischemic change.  There is prominence of the sulci and ventricles consistent with brain atrophy.  No new findings identified.  The paranasal sinuses and mastoid air cells are clear.  The skull is intact.  IMPRESSION:  1.  Stable appearance of para falcine and tentorial hematomas.  Original Report Authenticated By: Rosealee Albee, M.D.   Ct Head Wo Contrast  05/15/2011  *RADIOLOGY REPORT*  Clinical Data:  77 year old male status post fall with laceration, loss of consciousness, pain.  CT HEAD WITHOUT CONTRAST CT CERVICAL SPINE WITHOUT CONTRAST  Technique:  Multidetector CT imaging of the head and cervical spine was performed following the standard protocol without intravenous contrast.  Multiplanar CT image reconstructions of the cervical spine were also generated.  Comparison:  Cervical MRI 02/13/2010.  CT HEAD  Findings: Visualized paranasal sinuses and mastoids are clear. Postoperative changes to the globes. Calcified atherosclerosis at the skull base.  Left posterior scalp laceration and hematoma. Underlying calvarium intact.  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  Generalized volume loss.  No ventriculomegaly.  Patchy cerebral white matter hypodensity. No evidence of cortically based acute infarction identified.  Trace para falcine subdural hematoma on the left (image 24).  Small volume of subdural blood along the left tentorium (image 12).  No other acute intracranial hemorrhage.  IMPRESSION: 1.  Small volume left subdural hematoma seen along the falx and tentorium.  No mass effect. 2.  No other acute traumatic injury to the brain. 3.  Posterior scalp hematoma without underlying fracture. 4.   Cervical findings are below.  CT CERVICAL SPINE  Findings: Visualized paraspinal soft tissues are within normal limits.  Retropharyngeal course of the carotid incidentally noted.  Chronic bulky anterior cervical endplate spurring with ankylosis from the C3 to the C6 level.  Chronic disc degeneration and spinal stenosis at C2-C3.  Chronic disc degeneration which might also result in spinal stenosis at C6-C7.  Visualized skull base is intact.  No atlanto-occipital dissociation.  Advanced upper cervical facet degeneration, vacuum facet phenomenon on the right at C2-C3. Cervicothoracic junction alignment is within normal limits.  Bilateral posterior element alignment is within normal limits.  No acute cervical fracture. Upper thoracic levels appear grossly intact.  IMPRESSION:  1. No acute fracture or listhesis identified in the cervical spine. Ligamentous injury is not excluded. 2.  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  Subsequent advanced degenerative changes with chronic spinal stenosis at C2-C3 and C6- C7.  Critical Value/emergent results were called by telephone at the time of interpretation on 05/15/2011  at 1812 hours  to  RN Hetty Blend, who verbally acknowledged these results.  Original Report Authenticated  By: H.LEE HALL III, M.D.   Ct Cervical Spine Wo Contrast  05/15/2011  *RADIOLOGY REPORT*  Clinical Data:  77 year old male status post fall with laceration, loss of consciousness, pain.  CT HEAD WITHOUT CONTRAST CT CERVICAL SPINE WITHOUT CONTRAST  Technique:  Multidetector CT imaging of the head and cervical spine was performed following the standard protocol without intravenous contrast.  Multiplanar CT image reconstructions of the cervical spine were also generated.  Comparison:  Cervical MRI 02/13/2010.  CT HEAD  Findings: Visualized paranasal sinuses and mastoids are clear. Postoperative changes to the globes. Calcified atherosclerosis at the skull base.  Left posterior scalp laceration and  hematoma. Underlying calvarium intact.  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  Generalized volume loss.  No ventriculomegaly.  Patchy cerebral white matter hypodensity. No evidence of cortically based acute infarction identified.  Trace para falcine subdural hematoma on the left (image 24).  Small volume of subdural blood along the left tentorium (image 12).  No other acute intracranial hemorrhage.  IMPRESSION: 1.  Small volume left subdural hematoma seen along the falx and tentorium.  No mass effect. 2.  No other acute traumatic injury to the brain. 3.  Posterior scalp hematoma without underlying fracture. 4.  Cervical findings are below.  CT CERVICAL SPINE  Findings: Visualized paraspinal soft tissues are within normal limits.  Retropharyngeal course of the carotid incidentally noted.  Chronic bulky anterior cervical endplate spurring with ankylosis from the C3 to the C6 level.  Chronic disc degeneration and spinal stenosis at C2-C3.  Chronic disc degeneration which might also result in spinal stenosis at C6-C7.  Visualized skull base is intact.  No atlanto-occipital dissociation.  Advanced upper cervical facet degeneration, vacuum facet phenomenon on the right at C2-C3. Cervicothoracic junction alignment is within normal limits.  Bilateral posterior element alignment is within normal limits.  No acute cervical fracture. Upper thoracic levels appear grossly intact.  IMPRESSION:  1. No acute fracture or listhesis identified in the cervical spine. Ligamentous injury is not excluded. 2.  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  Subsequent advanced degenerative changes with chronic spinal stenosis at C2-C3 and C6- C7.  Critical Value/emergent results were called by telephone at the time of interpretation on 05/15/2011  at 1812 hours  to  RN Hetty Blend, who verbally acknowledged these results.  Original Report Authenticated By: Harley Hallmark, M.D.    Brief H and P: Patient is an 77 year old man with  pmh significant for HTN, DM, CAD and COPD; who came into the hospital secondary to transient LOC and fall. Patient had hx of syncope and apparently experienced another syncopal episode and fall on the day of admission.Denies any focal deficit, denies any headache blurred vision, any incontinence of urine or any seizure like activity. Patient was brought to the ER, he had CAT scan of his head which showed left-sided subdural hematoma. Neurosurgeon on call Dr.Cram was consulted by ER physician. Dr. Wynetta Emery advised admission under hospitalist and will be seen the patient in consult. Patient at this time on exam denies any headache chest pain shortness of breath weakness of limbs.  Hospital Course:  1-Subdural hematoma: no surgical intervention required. He had serial CT's of his head that demonstrated no progression of his subdural hematomas. Per neurosurgery rec's aspirin and plavix were hold for 3 days and after PT/OT evaluation patient has been discharge back home with [facility]/OT. 2-Diabetes mellitus:well controlled, continue current regimen. 3-High cholesterol: continue statins and fatty acids. 4-Depression/anxiety: continue cymbalta and valium.  5-Angina: continue medications as prescribed by his cardiology and follow up with him as instructed prior to admision. 6-COPD (chronic obstructive pulmonary disease): stable continue spiriva and flonase. 7-Coronary artery disease (s/p CABG): continue beta blockers, statins and after 05/20/11 aspirin and plavix. 8-BPH:continue flomax.  Time spent on Discharge: 45 minutes  Signed: Ilian Sharp 05/19/2011, 4:15 PM

## 2011-05-19 NOTE — Progress Notes (Signed)
Reviewed discharge instructions with patient and daughter, they stated their understanding.  Information concerning Subdural Hematomas also given to patient.  IV discontinued, cath inrtact.  Patient escorted via wheelchair to daughters car.  Colman Cater

## 2011-05-19 NOTE — Progress Notes (Signed)
Clinical social worker spoke with pt daughter Garry Heater, [phone] to discuss pt recommendations from physical therapy. Physical therapy recommended home health pt/ot and for pt to dc to independent living apartment. CSW informed RN Case manger of pt independent living facility, contracted [facility] health [facility], and the pt daughter request for CNA for patient at apartment through [facility]. RN case manager to follow up with facility and pt daughter. No further CSW needs, signing off.    Catha Gosselin, Theresia Majors  [phone] .05/19/2011 10:31am

## 2011-05-19 NOTE — Progress Notes (Signed)
Have faxed hhpt/ot orders and face to face to [facility] that is inhouse therapy at [facility] where pt lives.

## 2011-05-19 NOTE — Progress Notes (Signed)
Physical Therapy Treatment Patient Details Name: Leon Sharp MRN: [id number] DOB: Nov 30, 1921 Today's Date: 05/19/2011  PT Assessment/Plan  PT - Assessment/Plan Comments on Treatment Session: pt notes feeling better today and agreeable to continued PT after D/C.   PT Plan: Discharge plan remains appropriate PT Frequency: Min 3X/week Follow Up Recommendations: Home health PT Equipment Recommended: None recommended by PT PT Goals  Acute Rehab PT Goals PT Goal: Supine/Side to Sit - Progress: Progressing toward goal PT Goal: Sit to Supine/Side - Progress: Not met PT Transfer Goal: Sit to Stand/Stand to Sit - Progress: Progressing toward goal PT Goal: Ambulate - Progress: Progressing toward goal  PT Treatment Precautions/Restrictions  Precautions Precautions: Fall Precaution Comments: Balance slightly decreased (in bed for several days and weak).  Encouraged patient to use RW at home. Required Braces or Orthoses: No Restrictions Weight Bearing Restrictions: No Mobility (including Balance) Bed Mobility Bed Mobility: Yes Supine to Sit: 5: Supervision;With rails;HOB elevated (Comment degrees) (HOB ~35) Transfers Transfers: Yes Sit to Stand: 5: Supervision;With upper extremity assist;From bed Stand to Sit: 5: Supervision;With upper extremity assist;With armrests;To chair/3-in-1 Ambulation/Gait Ambulation/Gait: Yes Ambulation/Gait Assistance: 4: Min assist Ambulation/Gait Assistance Details (indicate cue type and reason): Occasional use of Rail.  No c/o lightheaded or dizzy Ambulation Distance (Feet): 140 Feet Assistive device: None Gait Pattern: Step-through pattern;Decreased stride length;Trunk flexed Gait velocity: Slow gait speed Stairs: No    Exercise    End of Session PT - End of Session Equipment Utilized During Treatment: Gait belt Activity Tolerance: Patient tolerated treatment well Patient left: in chair;with call bell in reach General Behavior During  Session: [facility] for tasks performed Cognition: [facility] for tasks performed  Sunny Schlein, [place] [phone] 05/19/2011, 10:19 AM

## 2011-05-19 NOTE — Progress Notes (Signed)
Utilization Review Completed.  Heather Streeper T  05/19/2011 

## 2011-05-19 NOTE — Progress Notes (Signed)
   CARE MANAGEMENT NOTE 05/19/2011  Patient:  WINTHROP, SHANNAHAN   Account Number:  [id number]  Date Initiated:  05/19/2011  Documentation initiated by:  Junius Creamer  Subjective/Objective Assessment:   adm w subdural hematoma     Action/Plan:   lives alone at [facility], they have on site phy therapy w [facility] ph [phone], fax [phone], they would like orders faxed at [facility]   Anticipated DC Date:  05/21/2011   Anticipated DC Plan:  HOME W HOME HEALTH SERVICES      DC Planning Services  CM consult      Choice offered to / List presented to:          [facility] arranged  HH-2 PT      Status of service:   Medicare Important Message given?   (If response is "NO", the following Medicare IM given date fields will be blank) Date Medicare IM given:   Date Additional Medicare IM given:    Discharge Disposition:  HOME W HOME HEALTH SERVICES  Per UR Regulation:    Comments:  11/26 they will use [facility] an on site phy ther at [facility], will fax orders when dc, have also spoken w [facility]oyd [phone], debbie Elcie Pelster rn,bsn [phone]

## 2011-05-21 LAB — VITAMIN D 1,25 DIHYDROXY
Vitamin D2 1, 25 (OH)2: <8 pg/mL
Vitamin D3 1, 25 (OH)2: 35 pg/mL

## 2011-12-29 ENCOUNTER — Other Ambulatory Visit: Payer: Self-pay | Admitting: Dermatology

## 2012-03-24 ENCOUNTER — Other Ambulatory Visit: Payer: Self-pay | Admitting: Dermatology

## 2012-07-06 ENCOUNTER — Other Ambulatory Visit: Payer: Self-pay | Admitting: Dermatology

## 2012-11-03 ENCOUNTER — Other Ambulatory Visit: Payer: Self-pay | Admitting: Dermatology

## 2013-10-25 ENCOUNTER — Encounter (HOSPITAL_BASED_OUTPATIENT_CLINIC_OR_DEPARTMENT_OTHER): Payer: Self-pay | Admitting: Emergency Medicine

## 2013-10-25 ENCOUNTER — Emergency Department (HOSPITAL_BASED_OUTPATIENT_CLINIC_OR_DEPARTMENT_OTHER)
Admission: EM | Admit: 2013-10-25 | Discharge: 2013-10-26 | Disposition: A | Discharge status: Home / Self Care | Payer: Medicare Other | Attending: Emergency Medicine | Admitting: Emergency Medicine

## 2013-10-25 DIAGNOSIS — Z794 Long term (current) use of insulin: Secondary | ICD-10-CM | POA: Insufficient documentation

## 2013-10-25 DIAGNOSIS — Z791 Long term (current) use of non-steroidal anti-inflammatories (NSAID): Secondary | ICD-10-CM | POA: Insufficient documentation

## 2013-10-25 DIAGNOSIS — Z79899 Other long term (current) drug therapy: Secondary | ICD-10-CM | POA: Insufficient documentation

## 2013-10-25 DIAGNOSIS — J449 Chronic obstructive pulmonary disease, unspecified: Secondary | ICD-10-CM | POA: Insufficient documentation

## 2013-10-25 DIAGNOSIS — Z862 Personal history of diseases of the blood and blood-forming organs and certain disorders involving the immune mechanism: Secondary | ICD-10-CM | POA: Insufficient documentation

## 2013-10-25 DIAGNOSIS — J4489 Other specified chronic obstructive pulmonary disease: Secondary | ICD-10-CM | POA: Insufficient documentation

## 2013-10-25 DIAGNOSIS — Z7901 Long term (current) use of anticoagulants: Secondary | ICD-10-CM | POA: Insufficient documentation

## 2013-10-25 DIAGNOSIS — I209 Angina pectoris, unspecified: Secondary | ICD-10-CM | POA: Insufficient documentation

## 2013-10-25 DIAGNOSIS — E119 Type 2 diabetes mellitus without complications: Secondary | ICD-10-CM | POA: Insufficient documentation

## 2013-10-25 DIAGNOSIS — I2510 Atherosclerotic heart disease of native coronary artery without angina pectoris: Secondary | ICD-10-CM | POA: Insufficient documentation

## 2013-10-25 DIAGNOSIS — N23 Unspecified renal colic: Secondary | ICD-10-CM

## 2013-10-25 DIAGNOSIS — IMO0002 Reserved for concepts with insufficient information to code with codable children: Secondary | ICD-10-CM | POA: Insufficient documentation

## 2013-10-25 DIAGNOSIS — Z7982 Long term (current) use of aspirin: Secondary | ICD-10-CM | POA: Insufficient documentation

## 2013-10-25 DIAGNOSIS — Z951 Presence of aortocoronary bypass graft: Secondary | ICD-10-CM | POA: Insufficient documentation

## 2013-10-25 DIAGNOSIS — I714 Abdominal aortic aneurysm, without rupture, unspecified: Secondary | ICD-10-CM | POA: Insufficient documentation

## 2013-10-25 DIAGNOSIS — E7800 Pure hypercholesterolemia, unspecified: Secondary | ICD-10-CM | POA: Insufficient documentation

## 2013-10-25 DIAGNOSIS — Z9889 Other specified postprocedural states: Secondary | ICD-10-CM | POA: Insufficient documentation

## 2013-10-25 DIAGNOSIS — Z87891 Personal history of nicotine dependence: Secondary | ICD-10-CM | POA: Insufficient documentation

## 2013-10-25 HISTORY — DX: Coagulation defect, unspecified: D68.9

## 2013-10-25 LAB — URINALYSIS, ROUTINE W REFLEX MICROSCOPIC
BILIRUBIN URINE: NEGATIVE
GLUCOSE, UA: 500 mg/dL — AB
HGB URINE DIPSTICK: NEGATIVE
KETONES UR: NEGATIVE mg/dL
Leukocytes, UA: NEGATIVE
Nitrite: NEGATIVE
PROTEIN: NEGATIVE mg/dL
Specific Gravity, Urine: 1.02 (ref 1.005–1.030)
Urobilinogen, UA: 1 mg/dL (ref 0.0–1.0)
pH: 6 (ref 5.0–8.0)

## 2013-10-25 NOTE — ED Notes (Signed)
Right low back pain since dinner time.

## 2013-10-26 ENCOUNTER — Emergency Department (HOSPITAL_BASED_OUTPATIENT_CLINIC_OR_DEPARTMENT_OTHER): Payer: Medicare Other

## 2013-10-26 MED ORDER — FENTANYL CITRATE 0.05 MG/ML IJ SOLN
50.0000 ug | Freq: Once | INTRAMUSCULAR | Status: AC
  Administered 2013-10-26: 50 ug via INTRAVENOUS
  Filled 2013-10-26: qty 2

## 2013-10-26 MED ORDER — HYDROCODONE-ACETAMINOPHEN 5-325 MG PO TABS
1.0000 | ORAL_TABLET | Freq: Once | ORAL | Status: AC
  Administered 2013-10-26: 1 via ORAL
  Filled 2013-10-26: qty 1

## 2013-10-26 MED ORDER — HYDROCODONE-ACETAMINOPHEN 5-325 MG PO TABS: 0.5000 | ORAL_TABLET | ORAL | Status: DC | PRN

## 2013-10-26 NOTE — ED Notes (Signed)
Patient transported to CT 

## 2013-10-26 NOTE — ED Notes (Signed)
MD at bedside. 

## 2013-10-26 NOTE — ED Provider Notes (Signed)
CSN: [id number]     Arrival date & time 10/25/13  2240 History   First MD Initiated Contact with Patient 10/26/13 0013     Chief Complaint  Patient presents with  . Flank Pain     (Consider location/radiation/quality/duration/timing/severity/associated sxs/prior Treatment) HPI This is an 80 year old male complaining of right flank pain that began about 6 PM yesterday evening if he was eating dinner. The pain is sharp, well localized and moderate to severe in intensity. It does not change with movement or palpation. There is no associated fever, chills, nausea, vomiting, diarrhea, constipation, dysuria or hematuria.  Past Medical History  Diagnosis Date  . Diabetes mellitus   . High cholesterol   . Cancer   . Skin cancer   . Angina   . COPD (chronic obstructive pulmonary disease)   . Blood transfusion   . Coronary artery disease   . Clotting disorder    Past Surgical History  Procedure Laterality Date  . Cardiac surgery    . Cholecystectomy    . Coronary artery bypass graft    . Back surgery    . Colon surgery     No family history on file. History  Substance Use Topics  . Smoking status: Former Research scientist (life sciences)  . Smokeless tobacco: Not on file  . Alcohol Use: 0.6 oz/week    1 Cans of beer per week     Comment: rarely    Review of Systems  All other systems reviewed and are negative.  Allergies  Review of patient's allergies indicates no known allergies.  Home Medications   Prior to Admission medications   Medication Sig Start Date End Date Taking? Authorizing Provider  aspirin 81 MG tablet Take 1 tablet (81 mg total) by mouth daily. Start taking this medication again on 05/20/11 05/20/11  Yes Barton Dubois, MD  celecoxib (CELEBREX) 200 MG capsule Take 200 mg by mouth 2 (two) times daily.   Yes Historical Provider, MD  clopidogrel (PLAVIX) 75 MG tablet Take 1 tablet (75 mg total) by mouth daily. 05/20/11  Yes Barton Dubois, MD  cyanocobalamin (,VITAMIN B-12,) 1000 MCG/ML  injection Inject 1,000 mcg into the muscle every 30 (thirty) days.    Yes Historical Provider, MD  donepezil (ARICEPT) 5 MG tablet Take 5 mg by mouth at bedtime.   Yes Historical Provider, MD  DULoxetine (CYMBALTA) 60 MG capsule Take 60 mg by mouth daily.     Yes Historical Provider, MD  fluticasone (FLONASE) 50 MCG/ACT nasal spray Place 2 sprays into the nose daily.     Yes Historical Provider, MD  insulin glargine (LANTUS) 100 UNIT/ML injection Inject 34 Units into the skin at bedtime.   Yes Historical Provider, MD  memantine (NAMENDA) 5 MG tablet Take 28 mg by mouth 2 (two) times daily.   Yes Historical Provider, MD  metoprolol (LOPRESSOR) 100 MG tablet Take 100 mg by mouth 2 (two) times daily.   Yes Historical Provider, MD  nitroGLYCERIN (NITRO-DUR) 0.8 MG/HR Place 1 patch onto the skin daily.    Yes Historical Provider, MD  nitroGLYCERIN (NITROSTAT) 0.4 MG SL tablet Place 0.4 mg under the tongue every 5 (five) minutes as needed. For chest pain     Yes Historical Provider, MD  Omega-3 Fatty Acids (FISH OIL) 1200 MG CAPS Take 1 capsule by mouth daily.    Yes Historical Provider, MD  pravastatin (PRAVACHOL) 20 MG tablet Take 20 mg by mouth daily.   Yes Historical Provider, MD  ranitidine (ZANTAC) 300 MG tablet  Take 300 mg by mouth 2 (two) times daily.     Yes Historical Provider, MD  sitaGLIPtin (JANUVIA) 25 MG tablet Take 100 mg by mouth daily.    Yes Historical Provider, MD  Tamsulosin HCl (FLOMAX) 0.4 MG CAPS Take 0.4 mg by mouth daily.     Yes Historical Provider, MD  tiotropium (SPIRIVA) 18 MCG inhalation capsule Place 18 mcg into inhaler and inhale daily.     Yes Historical Provider, MD  bisoprolol (ZEBETA) 5 MG tablet Take 5 mg by mouth daily.      Historical Provider, MD  calcium-vitamin D (OSCAL WITH D) 500-200 MG-UNIT per tablet Take 1 tablet by mouth 2 (two) times daily. 05/19/11 05/18/12  Barton Dubois, MD  diazepam (VALIUM) 5 MG tablet Take 5 mg by mouth 2 (two) times daily as  needed. For stomach    Historical Provider, MD  simvastatin (ZOCOR) 20 MG tablet Take 20 mg by mouth daily.      Historical Provider, MD   BP 175/77  Pulse 72  Temp(Src) 97.6 F (36.4 C) (Oral)  Resp 20  Ht 5\' 9"  (1.753 m)  Wt 165 lb (74.844 kg)  BMI 24.36 kg/m2  SpO2 94%  Physical Exam General: Well-developed, well-nourished male in no acute distress; appearance consistent with age of record HENT: normocephalic; atraumatic Eyes: pupils equal, round and reactive to light; extraocular muscles intact; ectropion of right lower eyelid Neck: supple Heart: regular rate and rhythm Lungs: clear to auscultation bilaterally Abdomen: soft; nondistended; nontender; no masses or hepatosplenomegaly; bowel sounds present GU: No CVA tenderness Extremities: No deformity; normal range of motion for age Neurologic: Awake, alert and oriented; motor function intact in all extremities and symmetric; no facial droop; hard of hearing Skin: Warm and dry; chronic-appearing hyperpigmentation of lower legs Psychiatric: Normal mood and affect   ED Course  Procedures (including critical care time)  MDM   Nursing notes and vitals signs, including pulse oximetry, reviewed.  Summary of this visit's results, reviewed by myself:  Labs:  Results for orders placed during the hospital encounter of 10/25/13 (from the past 24 hour(s))  URINALYSIS, ROUTINE W REFLEX MICROSCOPIC     Status: Abnormal   Collection Time    10/25/13 11:05 PM      Result Value Ref Range   Color, Urine YELLOW  YELLOW   APPearance CLEAR  CLEAR   Specific Gravity, Urine 1.020  1.005 - 1.030   pH 6.0  5.0 - 8.0   Glucose, UA 500 (*) NEGATIVE mg/dL   Hgb urine dipstick NEGATIVE  NEGATIVE   Bilirubin Urine NEGATIVE  NEGATIVE   Ketones, ur NEGATIVE  NEGATIVE mg/dL   Protein, ur NEGATIVE  NEGATIVE mg/dL   Urobilinogen, UA 1.0  0.0 - 1.0 mg/dL   Nitrite NEGATIVE  NEGATIVE   Leukocytes, UA NEGATIVE  NEGATIVE    Imaging Studies: Ct  Abdomen Pelvis Wo Contrast  10/26/2013   CLINICAL DATA:  Right flank pain.  EXAM: CT ABDOMEN AND PELVIS WITHOUT CONTRAST  TECHNIQUE: Multidetector CT imaging of the abdomen and pelvis was performed following the standard protocol without IV contrast.  COMPARISON:  None.  FINDINGS: LOWER CHEST: Extensive coronary atherosclerosis, status post CABG.  ABDOMEN/PELVIS:  Liver: No focal abnormality.  Biliary: Cholecystectom.  Pancreas: Unremarkable.  Spleen: Unremarkable.  Adrenals: Low-density mass in the posterior right upper quadrant, 5.2 cm in diameter, has coarse peripheral calcifications. This appears closest to the right adrenal gland, favor pseudocyst from remote hemorrhage or infection.  Kidneys and  ureters: Mild right hydroureteronephrosis. No ureteral calculus identified. There is extensive right perinephric stranding. Noted 2.6 cm cyst in the lower pole right kidney. Punctate stone in the lower pole left kidney.  Bladder: Unremarkable.  Reproductive: Unremarkable for age  Bowel: Sigmoid diverticulosis. Colorectal anastomosis. Normal appendix.  Retroperitoneum: Negative for adenopathy.  Peritoneum: No free fluid or gas.  Vascular: Abdominal aortic atherosclerosis with 3.3 cm fusiform infrarenal aortic aneurysm.  OSSEOUS: Severe lower and mid lumbar degenerative disc and facet disease.  IMPRESSION: 1. Although no ureteral calculus identified, right hydronephrosis and perinephric edema favors recently passed stone. Correlate with urinalysis. 2. Small, nonobstructive left nephrolithiasis. 3. 3.3 cm abdominal aortic aneurysm. 4. Additional incidental findings noted above.   Electronically Signed   By: Jorje Guild M.D.   On: 10/26/2013 01:02   1:12 AM Patient pain-free at this time after 50 mcg of fentanyl IV. He and his family were advised of the CT changes consistent with a passed stone. There also advised of his aortic aneurysm which is not at a critical diameter.     Wynetta Fines, MD 10/26/13 [phone]

## 2014-06-13 ENCOUNTER — Other Ambulatory Visit: Payer: Self-pay | Admitting: Dermatology

## 2015-10-10 ENCOUNTER — Encounter (HOSPITAL_BASED_OUTPATIENT_CLINIC_OR_DEPARTMENT_OTHER): Payer: Self-pay | Admitting: *Deleted

## 2015-10-10 NOTE — Progress Notes (Signed)
Patient's chart reviewed by Dr Smith Robert. Patient has an extensive medical hx including IDDM, CAD, COPD, HTN, Parkinson's and Dementia. States that patient can be done here under local anesthesia only. Dr Dillingham's office notified and waiting to hear if OK with her.

## 2015-10-11 NOTE — Progress Notes (Signed)
Kim called from Dr. Eusebio Friendly office. OK for pt to be done as a local per Dr. Alphonsa Gin to call scheduling and change to local.

## 2015-10-15 ENCOUNTER — Other Ambulatory Visit: Payer: Self-pay | Admitting: Plastic Surgery

## 2015-10-15 DIAGNOSIS — S81801A Unspecified open wound, right lower leg, initial encounter: Secondary | ICD-10-CM

## 2015-10-15 NOTE — H&P (Signed)
Leon Sharp is an 82 y.o. male.   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 He is not sure how long he has had the lesion but likely several months. He has a long history of skin cancer and is seen by Dr. Harvel Quale. The plan is for mohs excision of the lesion and then we will take him to the OR for partial closure and placement of Acell and VAC dressing. He has significant lymphedema of the legs and likely peripheral vascular disease. The area is at the midportion of the anterior right leg. It is 2 x 3 cm in diameter. There are several other wounds on the leg proximally and distally that look like the peripheral vascular disease. He is taking Plavix (stents) but can come off for short periods of time. The pedal pulse is weak but present. He is going to come off his Plavix for several days prior to the procedure. He ambulates with a rolling walker and has Parkinson's disease and some dementia.   Past Medical History  Diagnosis Date  . High cholesterol   . Cancer ([place])   . Skin cancer   . Angina   . COPD (chronic obstructive pulmonary disease) ([place])   . Blood transfusion   . Clotting disorder ([place])   . Dementia   . Parkinson's disease ([place])   . Hypertension   . Diabetes mellitus     IDDM  . HOH (hard of hearing)     wears hearing aids sometimes  . Chronic kidney disease   . Coronary artery disease     CABG 1972, redo 1997  . Myocardial infarction [facility])     multiple stents since CABG  . Arthritis   . GERD (gastroesophageal reflux disease)     Past Surgical History  Procedure Laterality Date  . Cardiac surgery    . Cholecystectomy    . Back surgery    . Colon surgery    . Coronary artery bypass graft  1972, redo 1997    No family history on file. Social  History:  reports that he has quit smoking. He does not have any smokeless tobacco history on file. He reports that he drinks about 0.6 oz of alcohol per week. He reports that he does not use illicit drugs.  Allergies: No Known Allergies   (Not in a hospital admission)  No results found for this or any previous visit (from the past 48 hour(s)). No results found.  Review of Systems  Constitutional: Negative.   HENT: Negative.   Eyes: Negative.   Respiratory: Negative.   Cardiovascular: Negative.   Gastrointestinal: Negative.   Genitourinary: Negative.   Musculoskeletal: Negative.   Skin: Negative.   Neurological: Negative.   Psychiatric/Behavioral: Negative.     There were no vitals taken for this visit. Physical Exam  Constitutional: He is oriented to person, place, and time. He appears well-developed and well-nourished.  HENT:  Head: Normocephalic and atraumatic.  Eyes: Conjunctivae and EOM are normal. Pupils are equal, round, and reactive to light.  Cardiovascular: Normal rate.   Respiratory: Effort normal.  GI: Soft. He exhibits no distension.  Musculoskeletal:       Legs: Neurological: He is alert and oriented to person, place, and time.  Skin: Skin is warm.  Psychiatric: He has  a normal mood and affect. His behavior is normal. Judgment and thought content normal.     Assessment/Plan Plan for at least partial closure of mohs defect of right anterior calf with possible Acell and VAC placement and Acell placement , possible VAC placement to chronic right distal ulceration.  The risks that can be encountered with and after excision of a skin lesion and placement of Acell and VAC dressing were discussed and include the following but not limited to these: bleeding, infection, delayed healing, anesthesia risks, skin sensation changes, injury to structures including nerves, blood vessels, and muscles which may be temporary or permanent, allergies to tape, suture materials and  glues, blood products, topical preparations or injected agents, skin contour irregularities, skin discoloration and swelling, deep vein thrombosis, cardiac and pulmonary complications, pain, which may persist, persistent pain, recurrence of the lesion, poor healing of the incision, possible need for revisional surgery or staged procedures. The daughter and patient is agreeable to surgery at this time and consent was obtained.   Wallace Going, DO 10/15/2015, 2:45 PM

## 2015-10-17 ENCOUNTER — Ambulatory Visit (HOSPITAL_BASED_OUTPATIENT_CLINIC_OR_DEPARTMENT_OTHER)
Admission: RE | Admit: 2015-10-17 | Discharge: 2015-10-17 | Disposition: A | Discharge status: Home / Self Care | Payer: Medicare Other | Source: Ambulatory Visit | Attending: Plastic Surgery | Admitting: Plastic Surgery

## 2015-10-17 ENCOUNTER — Encounter (HOSPITAL_BASED_OUTPATIENT_CLINIC_OR_DEPARTMENT_OTHER): Payer: Self-pay | Admitting: Plastic Surgery

## 2015-10-17 ENCOUNTER — Encounter (HOSPITAL_BASED_OUTPATIENT_CLINIC_OR_DEPARTMENT_OTHER): Payer: Self-pay | Admitting: Anesthesiology

## 2015-10-17 ENCOUNTER — Encounter (HOSPITAL_BASED_OUTPATIENT_CLINIC_OR_DEPARTMENT_OTHER)
Admission: RE | Disposition: A | Discharge status: Home / Self Care | Payer: Self-pay | Source: Ambulatory Visit | Attending: Plastic Surgery

## 2015-10-17 DIAGNOSIS — F0280 Dementia in other diseases classified elsewhere without behavioral disturbance: Secondary | ICD-10-CM | POA: Diagnosis not present

## 2015-10-17 DIAGNOSIS — K219 Gastro-esophageal reflux disease without esophagitis: Secondary | ICD-10-CM | POA: Insufficient documentation

## 2015-10-17 DIAGNOSIS — Z7951 Long term (current) use of inhaled steroids: Secondary | ICD-10-CM | POA: Insufficient documentation

## 2015-10-17 DIAGNOSIS — Z87891 Personal history of nicotine dependence: Secondary | ICD-10-CM | POA: Diagnosis not present

## 2015-10-17 DIAGNOSIS — J449 Chronic obstructive pulmonary disease, unspecified: Secondary | ICD-10-CM | POA: Diagnosis not present

## 2015-10-17 DIAGNOSIS — G20 Parkinson's disease: Secondary | ICD-10-CM | POA: Insufficient documentation

## 2015-10-17 DIAGNOSIS — Z79899 Other long term (current) drug therapy: Secondary | ICD-10-CM | POA: Insufficient documentation

## 2015-10-17 DIAGNOSIS — Z951 Presence of aortocoronary bypass graft: Secondary | ICD-10-CM | POA: Diagnosis not present

## 2015-10-17 DIAGNOSIS — E7800 Pure hypercholesterolemia, unspecified: Secondary | ICD-10-CM | POA: Diagnosis not present

## 2015-10-17 DIAGNOSIS — Z794 Long term (current) use of insulin: Secondary | ICD-10-CM | POA: Diagnosis not present

## 2015-10-17 DIAGNOSIS — I2510 Atherosclerotic heart disease of native coronary artery without angina pectoris: Secondary | ICD-10-CM | POA: Insufficient documentation

## 2015-10-17 DIAGNOSIS — I129 Hypertensive chronic kidney disease with stage 1 through stage 4 chronic kidney disease, or unspecified chronic kidney disease: Secondary | ICD-10-CM | POA: Insufficient documentation

## 2015-10-17 DIAGNOSIS — Z85828 Personal history of other malignant neoplasm of skin: Secondary | ICD-10-CM | POA: Diagnosis not present

## 2015-10-17 DIAGNOSIS — S81801A Unspecified open wound, right lower leg, initial encounter: Secondary | ICD-10-CM

## 2015-10-17 DIAGNOSIS — E1122 Type 2 diabetes mellitus with diabetic chronic kidney disease: Secondary | ICD-10-CM | POA: Insufficient documentation

## 2015-10-17 DIAGNOSIS — Z7902 Long term (current) use of antithrombotics/antiplatelets: Secondary | ICD-10-CM | POA: Diagnosis not present

## 2015-10-17 DIAGNOSIS — M21851 Other specified acquired deformities of right thigh: Secondary | ICD-10-CM | POA: Insufficient documentation

## 2015-10-17 DIAGNOSIS — Z7982 Long term (current) use of aspirin: Secondary | ICD-10-CM | POA: Diagnosis not present

## 2015-10-17 DIAGNOSIS — N189 Chronic kidney disease, unspecified: Secondary | ICD-10-CM | POA: Diagnosis not present

## 2015-10-17 HISTORY — PX: APPLICATION OF WOUND VAC: SHX5189

## 2015-10-17 HISTORY — DX: Gastro-esophageal reflux disease without esophagitis: K21.9

## 2015-10-17 HISTORY — DX: Parkinson's disease without dyskinesia, without mention of fluctuations: G20.A1

## 2015-10-17 HISTORY — DX: Parkinson's disease: G20

## 2015-10-17 HISTORY — DX: Unspecified dementia, unspecified severity, without behavioral disturbance, psychotic disturbance, mood disturbance, and anxiety: F03.90

## 2015-10-17 HISTORY — DX: Unspecified osteoarthritis, unspecified site: M19.90

## 2015-10-17 HISTORY — DX: Acute myocardial infarction, unspecified: I21.9

## 2015-10-17 HISTORY — DX: Chronic kidney disease, unspecified: N18.9

## 2015-10-17 HISTORY — DX: Unspecified hearing loss, unspecified ear: H91.90

## 2015-10-17 HISTORY — DX: Essential (primary) hypertension: I10

## 2015-10-17 LAB — GLUCOSE, CAPILLARY: Glucose-Capillary: 157 mg/dL — ABNORMAL HIGH (ref 65–99)

## 2015-10-17 SURGERY — APPLICATION, WOUND VAC
Anesthesia: LOCAL | Laterality: Right

## 2015-10-17 MED ORDER — SODIUM CHLORIDE 0.9 % IR SOLN
Status: DC | PRN
  Administered 2015-10-17: 500 mL

## 2015-10-17 SURGICAL SUPPLY — 79 items
BAG DECANTER FOR FLEXI CONT (MISCELLANEOUS) ×2 IMPLANT
BANDAGE ACE 3X5.8 VEL STRL LF (GAUZE/BANDAGES/DRESSINGS) IMPLANT
BANDAGE ACE 4X5 VEL STRL LF (GAUZE/BANDAGES/DRESSINGS) ×2 IMPLANT
BANDAGE ACE 6X5 VEL STRL LF (GAUZE/BANDAGES/DRESSINGS) IMPLANT
BENZOIN TINCTURE PRP APPL 2/3 (GAUZE/BANDAGES/DRESSINGS) IMPLANT
BLADE HEX COATED 2.75 (ELECTRODE) ×2 IMPLANT
BLADE MINI RND TIP GREEN BEAV (BLADE) IMPLANT
BLADE SURG 10 STRL SS (BLADE) ×2 IMPLANT
BLADE SURG 15 STRL LF DISP TIS (BLADE) ×1 IMPLANT
BLADE SURG 15 STRL SS (BLADE) ×1
BNDG COHESIVE 4X5 TAN STRL (GAUZE/BANDAGES/DRESSINGS) IMPLANT
BNDG GAUZE ELAST 4 BULKY (GAUZE/BANDAGES/DRESSINGS) ×4 IMPLANT
CANISTER SUCT 1200ML W/VALVE (MISCELLANEOUS) IMPLANT
CORDS BIPOLAR (ELECTRODE) IMPLANT
COVER BACK TABLE 60X90IN (DRAPES) ×2 IMPLANT
COVER MAYO STAND STRL (DRAPES) ×2 IMPLANT
DECANTER SPIKE VIAL GLASS SM (MISCELLANEOUS) IMPLANT
DERMABOND ADVANCED (GAUZE/BANDAGES/DRESSINGS)
DERMABOND ADVANCED .7 DNX12 (GAUZE/BANDAGES/DRESSINGS) IMPLANT
DRAIN PENROSE 1/2X12 LTX STRL (WOUND CARE) IMPLANT
DRAPE INCISE IOBAN 66X45 STRL (DRAPES) ×2 IMPLANT
DRAPE U-SHAPE 76X120 STRL (DRAPES) ×2 IMPLANT
DRESSING DUODERM 4X4 STERILE (GAUZE/BANDAGES/DRESSINGS) ×4 IMPLANT
DRSG ADAPTIC 3X8 NADH LF (GAUZE/BANDAGES/DRESSINGS) ×2 IMPLANT
DRSG EMULSION OIL 3X3 NADH (GAUZE/BANDAGES/DRESSINGS) ×2 IMPLANT
DRSG PAD ABDOMINAL 8X10 ST (GAUZE/BANDAGES/DRESSINGS) IMPLANT
ELECT NEEDLE TIP 2.8 STRL (NEEDLE) IMPLANT
ELECT REM PT RETURN 9FT ADLT (ELECTROSURGICAL) ×2
ELECTRODE REM PT RTRN 9FT ADLT (ELECTROSURGICAL) ×1 IMPLANT
GAUZE SPONGE 4X4 12PLY STRL (GAUZE/BANDAGES/DRESSINGS) ×2 IMPLANT
GLOVE BIO SURGEON STRL SZ 6.5 (GLOVE) ×4 IMPLANT
GLOVE BIO SURGEON STRL SZ7.5 (GLOVE) ×2 IMPLANT
GLOVE BIOGEL PI IND STRL 7.0 (GLOVE) ×1 IMPLANT
GLOVE BIOGEL PI IND STRL 7.5 (GLOVE) ×1 IMPLANT
GLOVE BIOGEL PI INDICATOR 7.0 (GLOVE) ×1
GLOVE BIOGEL PI INDICATOR 7.5 (GLOVE) ×1
GOWN STRL REUS W/ TWL LRG LVL3 (GOWN DISPOSABLE) ×2 IMPLANT
GOWN STRL REUS W/TWL LRG LVL3 (GOWN DISPOSABLE) ×2
MATRIX SURGICAL PSM 5X5CM (Tissue) ×2 IMPLANT
MATRIX SURGICAL PSM 7X10CM (Tissue) ×2 IMPLANT
MICROMATRIX 1000MG (Tissue) ×2 IMPLANT
NEEDLE HYPO 25X1 1.5 SAFETY (NEEDLE) IMPLANT
NEEDLE PRECISIONGLIDE 27X1.5 (NEEDLE) ×2 IMPLANT
NS IRRIG 1000ML POUR BTL (IV SOLUTION) ×2 IMPLANT
PACK BASIN DAY SURGERY FS (CUSTOM PROCEDURE TRAY) ×2 IMPLANT
PADDING CAST ABS 3INX4YD NS (CAST SUPPLIES)
PADDING CAST ABS 4INX4YD NS (CAST SUPPLIES)
PADDING CAST ABS COTTON 3X4 (CAST SUPPLIES) IMPLANT
PADDING CAST ABS COTTON 4X4 ST (CAST SUPPLIES) IMPLANT
PENCIL BUTTON HOLSTER BLD 10FT (ELECTRODE) ×2 IMPLANT
SHEET MEDIUM DRAPE 40X70 STRL (DRAPES) ×2 IMPLANT
SLEEVE SCD COMPRESS KNEE MED (MISCELLANEOUS) IMPLANT
SOLUTION PARTIC MCRMTRX 1000MG (Tissue) ×1 IMPLANT
SPLINT FIBERGLASS 3X35 (CAST SUPPLIES) ×2 IMPLANT
SPLINT FIBERGLASS 4X30 (CAST SUPPLIES) IMPLANT
SPLINT PLASTER CAST XFAST 3X15 (CAST SUPPLIES) IMPLANT
SPLINT PLASTER XTRA FASTSET 3X (CAST SUPPLIES)
SPONGE GAUZE 4X4 12PLY STER LF (GAUZE/BANDAGES/DRESSINGS) IMPLANT
SPONGE LAP 18X18 X RAY DECT (DISPOSABLE) ×4 IMPLANT
STAPLER VISISTAT 35W (STAPLE) IMPLANT
STOCKINETTE 4X48 STRL (DRAPES) IMPLANT
STOCKINETTE 6  STRL (DRAPES) ×1
STOCKINETTE 6 STRL (DRAPES) ×1 IMPLANT
STOCKINETTE IMPERVIOUS LG (DRAPES) IMPLANT
SUCTION FRAZIER HANDLE 10FR (MISCELLANEOUS)
SUCTION TUBE FRAZIER 10FR DISP (MISCELLANEOUS) IMPLANT
SURGILUBE 2OZ TUBE FLIPTOP (MISCELLANEOUS) ×2 IMPLANT
SUT SILK 3 0 PS 1 (SUTURE) IMPLANT
SUT VIC AB 3-0 FS2 27 (SUTURE) IMPLANT
SUT VIC AB 5-0 P-3 18X BRD (SUTURE) IMPLANT
SUT VIC AB 5-0 P3 18 (SUTURE)
SUT VIC AB 5-0 PS2 18 (SUTURE) ×6 IMPLANT
SYR BULB IRRIGATION 50ML (SYRINGE) ×2 IMPLANT
SYR CONTROL 10ML LL (SYRINGE) ×2 IMPLANT
TOWEL OR 17X24 6PK STRL BLUE (TOWEL DISPOSABLE) ×2 IMPLANT
TRAY DSU PREP LF (CUSTOM PROCEDURE TRAY) ×2 IMPLANT
TUBE CONNECTING 20X1/4 (TUBING) ×2 IMPLANT
UNDERPAD 30X30 (UNDERPADS AND DIAPERS) ×2 IMPLANT
YANKAUER SUCT BULB TIP NO VENT (SUCTIONS) ×2 IMPLANT

## 2015-10-17 NOTE — Brief Op Note (Signed)
10/17/2015  1:27 PM  PATIENT:  Leon Sharp  82 y.o. male  PRE-OPERATIVE DIAGNOSIS:  right leg wound  POST-OPERATIVE DIAGNOSIS:  right leg wound  PROCEDURE:  Procedure(s): A CELL AND WOUND VAC PLACEMENT RIGHT LEG WOUND (Right)  SURGEON:  Surgeon(s) and Role:    * Claire S Dillingham, DO - Primary  ASSISTANTS: none   ANESTHESIA:   none  EBL:     BLOOD ADMINISTERED:none  DRAINS: none   LOCAL MEDICATIONS USED:  NONE  SPECIMEN:  No Specimen  DISPOSITION OF SPECIMEN:  N/A  COUNTS:  YES  TOURNIQUET:  * No tourniquets in log *  DICTATION: .Dragon Dictation  PLAN OF CARE: Discharge to home after PACU  PATIENT DISPOSITION:  PACU - hemodynamically stable.   Delay start of Pharmacological VTE agent (>24hrs) due to surgical blood loss or risk of bleeding: no

## 2015-10-17 NOTE — Op Note (Signed)
Operative Note   DATE OF OPERATION: 10/17/2015  LOCATION: [place]  SURGICAL DIVISION: Plastic Surgery  PREOPERATIVE DIAGNOSES:  Right leg skin cancer mohs defects distal 5 x 5 cm, proximal 4 x 3 cm and middle 6 x 9 cm  POSTOPERATIVE DIAGNOSES:  same  PROCEDURE:  Preparation for placement of Acell (Powder 1 gm and sheet 7 x 10 cm and 5 x 5 cm) to right leg   SURGEON: Keva Darty Sanger Cellie Dardis, DO  ANESTHESIA:  General.   COMPLICATIONS: None.   INDICATIONS FOR PROCEDURE:  The patient, Leon Sharp is a 82 y.o. male born on Apr 02, 1922, is here for treatment of mohs defects after excision of skin cancer to right leg.  MRN: [id number]  CONSENT:  Informed consent was obtained directly from the patient. Risks, benefits and alternatives were fully discussed. Specific risks including but not limited to bleeding, infection, hematoma, seroma, scarring, pain, infection, contracture, asymmetry, wound healing problems, and need for further surgery were all discussed. The patient did have an ample opportunity to have questions answered to satisfaction.   DESCRIPTION OF PROCEDURE:  The patient was taken to the operating room. SCD was placed on the left leg and IV antibiotics were given. The patient's operative site was prepped and draped in a sterile fashion. A time out was performed and all information was confirmed to be correct.  The currette was used to debride the wounds.  Antibiotic solution and saline was used to irrigate the wound.  All the Acell powder was placed at each of the three sites.  The 5 x 5 cm sheet was sutured to the distal wound.  The proximal wound had 4 x 3 cm sheet placed and the middle wound had a 7 x 10 cm sheet placed and secured with 5-0 Vicryl.  The adaptic and duoderm were placed.  The KY gel and VAC were used to cover the wounds.  There was an excellent seal.  The patient tolerated the procedure well.  There were no complications. The patient  was allowed to wake from anesthesia, extubated and taken to the recovery room in satisfactory condition.

## 2015-10-17 NOTE — Discharge Instructions (Signed)
Keep VAC connected

## 2015-10-17 NOTE — Interval H&P Note (Signed)
History and Physical Interval Note:  10/17/2015 7:55 AM  Leon Sharp  has presented today for surgery, with the diagnosis of right leg wound  The various methods of treatment have been discussed with the patient and family. After consideration of risks, benefits and other options for treatment, the patient has consented to  Procedure(s): A CELL AND WOUND VAC PLACEMENT RIGHT LEG WOUND (Right) APPLICATION OF A-CELL OF EXTREMITY (Right) as a surgical intervention .  The patient's history has been reviewed, patient examined, no change in status, stable for surgery.  I have reviewed the patient's chart and labs.  Questions were answered to the patient's satisfaction.     Wallace Going

## 2015-10-17 NOTE — H&P (View-Only) (Signed)
Leon Sharp is an 82 y.o. male.   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 He is not sure how long he has had the lesion but likely several months. He has a long history of skin cancer and is seen by Dr. Harvel Quale. The plan is for mohs excision of the lesion and then we will take him to the OR for partial closure and placement of Acell and VAC dressing. He has significant lymphedema of the legs and likely peripheral vascular disease. The area is at the midportion of the anterior right leg. It is 2 x 3 cm in diameter. There are several other wounds on the leg proximally and distally that look like the peripheral vascular disease. He is taking Plavix (stents) but can come off for short periods of time. The pedal pulse is weak but present. He is going to come off his Plavix for several days prior to the procedure. He ambulates with a rolling walker and has Parkinson's disease and some dementia.   Past Medical History  Diagnosis Date  . High cholesterol   . Cancer ([place])   . Skin cancer   . Angina   . COPD (chronic obstructive pulmonary disease) ([place])   . Blood transfusion   . Clotting disorder ([place])   . Dementia   . Parkinson's disease ([place])   . Hypertension   . Diabetes mellitus     IDDM  . HOH (hard of hearing)     wears hearing aids sometimes  . Chronic kidney disease   . Coronary artery disease     CABG 1972, redo 1997  . Myocardial infarction [facility])     multiple stents since CABG  . Arthritis   . GERD (gastroesophageal reflux disease)     Past Surgical History  Procedure Laterality Date  . Cardiac surgery    . Cholecystectomy    . Back surgery    . Colon surgery    . Coronary artery bypass graft  1972, redo 1997    No family history on file. Social  History:  reports that he has quit smoking. He does not have any smokeless tobacco history on file. He reports that he drinks about 0.6 oz of alcohol per week. He reports that he does not use illicit drugs.  Allergies: No Known Allergies   (Not in a hospital admission)  No results found for this or any previous visit (from the past 48 hour(s)). No results found.  Review of Systems  Constitutional: Negative.   HENT: Negative.   Eyes: Negative.   Respiratory: Negative.   Cardiovascular: Negative.   Gastrointestinal: Negative.   Genitourinary: Negative.   Musculoskeletal: Negative.   Skin: Negative.   Neurological: Negative.   Psychiatric/Behavioral: Negative.     There were no vitals taken for this visit. Physical Exam  Constitutional: He is oriented to person, place, and time. He appears well-developed and well-nourished.  HENT:  Head: Normocephalic and atraumatic.  Eyes: Conjunctivae and EOM are normal. Pupils are equal, round, and reactive to light.  Cardiovascular: Normal rate.   Respiratory: Effort normal.  GI: Soft. He exhibits no distension.  Musculoskeletal:       Legs: Neurological: He is alert and oriented to person, place, and time.  Skin: Skin is warm.  Psychiatric: He has  a normal mood and affect. His behavior is normal. Judgment and thought content normal.     Assessment/Plan Plan for at least partial closure of mohs defect of right anterior calf with possible Acell and VAC placement and Acell placement , possible VAC placement to chronic right distal ulceration.  The risks that can be encountered with and after excision of a skin lesion and placement of Acell and VAC dressing were discussed and include the following but not limited to these: bleeding, infection, delayed healing, anesthesia risks, skin sensation changes, injury to structures including nerves, blood vessels, and muscles which may be temporary or permanent, allergies to tape, suture materials and  glues, blood products, topical preparations or injected agents, skin contour irregularities, skin discoloration and swelling, deep vein thrombosis, cardiac and pulmonary complications, pain, which may persist, persistent pain, recurrence of the lesion, poor healing of the incision, possible need for revisional surgery or staged procedures. The daughter and patient is agreeable to surgery at this time and consent was obtained.   Wallace Going, DO 10/15/2015, 2:45 PM

## 2015-10-18 ENCOUNTER — Encounter (HOSPITAL_BASED_OUTPATIENT_CLINIC_OR_DEPARTMENT_OTHER): Payer: Self-pay | Admitting: Plastic Surgery

## 2015-10-29 ENCOUNTER — Other Ambulatory Visit: Payer: Self-pay | Admitting: Plastic Surgery

## 2015-10-29 ENCOUNTER — Encounter (HOSPITAL_BASED_OUTPATIENT_CLINIC_OR_DEPARTMENT_OTHER): Payer: Self-pay | Admitting: *Deleted

## 2015-10-29 DIAGNOSIS — S81801A Unspecified open wound, right lower leg, initial encounter: Secondary | ICD-10-CM

## 2015-11-01 ENCOUNTER — Encounter (HOSPITAL_BASED_OUTPATIENT_CLINIC_OR_DEPARTMENT_OTHER): Payer: Self-pay | Admitting: Anesthesiology

## 2015-11-01 ENCOUNTER — Other Ambulatory Visit: Payer: Self-pay | Admitting: Plastic Surgery

## 2015-11-01 ENCOUNTER — Encounter (HOSPITAL_BASED_OUTPATIENT_CLINIC_OR_DEPARTMENT_OTHER): Payer: Self-pay | Admitting: Plastic Surgery

## 2015-11-01 ENCOUNTER — Ambulatory Visit (HOSPITAL_BASED_OUTPATIENT_CLINIC_OR_DEPARTMENT_OTHER)
Admission: RE | Admit: 2015-11-01 | Discharge: 2015-11-01 | Disposition: A | Discharge status: Home / Self Care | Payer: Medicare Other | Source: Ambulatory Visit | Attending: Plastic Surgery | Admitting: Plastic Surgery

## 2015-11-01 ENCOUNTER — Encounter (HOSPITAL_BASED_OUTPATIENT_CLINIC_OR_DEPARTMENT_OTHER)
Admission: RE | Disposition: A | Discharge status: Home / Self Care | Payer: Self-pay | Source: Ambulatory Visit | Attending: Plastic Surgery

## 2015-11-01 DIAGNOSIS — Z955 Presence of coronary angioplasty implant and graft: Secondary | ICD-10-CM | POA: Diagnosis not present

## 2015-11-01 DIAGNOSIS — C44702 Unspecified malignant neoplasm of skin of right lower limb, including hip: Secondary | ICD-10-CM | POA: Diagnosis not present

## 2015-11-01 DIAGNOSIS — G20 Parkinson's disease: Secondary | ICD-10-CM | POA: Diagnosis not present

## 2015-11-01 DIAGNOSIS — Z87891 Personal history of nicotine dependence: Secondary | ICD-10-CM | POA: Diagnosis not present

## 2015-11-01 DIAGNOSIS — F0390 Unspecified dementia without behavioral disturbance: Secondary | ICD-10-CM | POA: Insufficient documentation

## 2015-11-01 DIAGNOSIS — I129 Hypertensive chronic kidney disease with stage 1 through stage 4 chronic kidney disease, or unspecified chronic kidney disease: Secondary | ICD-10-CM | POA: Insufficient documentation

## 2015-11-01 DIAGNOSIS — Z794 Long term (current) use of insulin: Secondary | ICD-10-CM | POA: Insufficient documentation

## 2015-11-01 DIAGNOSIS — J449 Chronic obstructive pulmonary disease, unspecified: Secondary | ICD-10-CM | POA: Diagnosis not present

## 2015-11-01 DIAGNOSIS — S81801A Unspecified open wound, right lower leg, initial encounter: Secondary | ICD-10-CM

## 2015-11-01 DIAGNOSIS — I252 Old myocardial infarction: Secondary | ICD-10-CM | POA: Insufficient documentation

## 2015-11-01 DIAGNOSIS — E1122 Type 2 diabetes mellitus with diabetic chronic kidney disease: Secondary | ICD-10-CM | POA: Insufficient documentation

## 2015-11-01 HISTORY — PX: MINOR APPLICATION OF WOUND VAC: SHX6243

## 2015-11-01 HISTORY — PX: MINOR IRRIGATION AND DEBRIDEMENT OF WOUND: SHX6239

## 2015-11-01 LAB — GLUCOSE, CAPILLARY: GLUCOSE-CAPILLARY: 117 mg/dL — AB (ref 65–99)

## 2015-11-01 SURGERY — MINOR IRRIGATION AND DEBRIDEMENT OF WOUND
Anesthesia: LOCAL | Site: Leg Lower | Laterality: Right

## 2015-11-01 MED ORDER — SODIUM CHLORIDE 0.9 % IR SOLN
Status: DC | PRN
  Administered 2015-11-01: 500 mL

## 2015-11-01 MED ORDER — LIDOCAINE-EPINEPHRINE 1 %-1:100000 IJ SOLN
INTRAMUSCULAR | Status: DC | PRN
  Administered 2015-11-01: 20 mL

## 2015-11-01 SURGICAL SUPPLY — 73 items
BAG DECANTER FOR FLEXI CONT (MISCELLANEOUS) ×3 IMPLANT
BANDAGE ACE 3X5.8 VEL STRL LF (GAUZE/BANDAGES/DRESSINGS) IMPLANT
BANDAGE ACE 4X5 VEL STRL LF (GAUZE/BANDAGES/DRESSINGS) ×6 IMPLANT
BANDAGE ACE 6X5 VEL STRL LF (GAUZE/BANDAGES/DRESSINGS) IMPLANT
BLADE HEX COATED 2.75 (ELECTRODE) IMPLANT
BLADE SURG 10 STRL SS (BLADE) IMPLANT
BLADE SURG 15 STRL LF DISP TIS (BLADE) ×2 IMPLANT
BLADE SURG 15 STRL SS (BLADE) ×1
BNDG COHESIVE 4X5 TAN STRL (GAUZE/BANDAGES/DRESSINGS) IMPLANT
BNDG CONFORM 2 STRL LF (GAUZE/BANDAGES/DRESSINGS) IMPLANT
BNDG CONFORM 3 STRL LF (GAUZE/BANDAGES/DRESSINGS) IMPLANT
BNDG GAUZE ELAST 4 BULKY (GAUZE/BANDAGES/DRESSINGS) ×6 IMPLANT
CANISTER SUCT 1200ML W/VALVE (MISCELLANEOUS) ×3 IMPLANT
CHLORAPREP W/TINT 26ML (MISCELLANEOUS) IMPLANT
COVER BACK TABLE 60X90IN (DRAPES) ×3 IMPLANT
DECANTER SPIKE VIAL GLASS SM (MISCELLANEOUS) IMPLANT
DERMABOND ADVANCED (GAUZE/BANDAGES/DRESSINGS)
DERMABOND ADVANCED .7 DNX12 (GAUZE/BANDAGES/DRESSINGS) IMPLANT
DRAPE INCISE IOBAN 66X45 STRL (DRAPES) ×3 IMPLANT
DRAPE U-SHAPE 76X120 STRL (DRAPES) ×3 IMPLANT
DRESSING DUODERM 4X4 STERILE (GAUZE/BANDAGES/DRESSINGS) ×3 IMPLANT
DRSG ADAPTIC 3X8 NADH LF (GAUZE/BANDAGES/DRESSINGS) ×3 IMPLANT
DRSG EMULSION OIL 3X3 NADH (GAUZE/BANDAGES/DRESSINGS) IMPLANT
DRSG PAD ABDOMINAL 8X10 ST (GAUZE/BANDAGES/DRESSINGS) IMPLANT
ELECT REM PT RETURN 9FT ADLT (ELECTROSURGICAL)
ELECTRODE REM PT RTRN 9FT ADLT (ELECTROSURGICAL) IMPLANT
GAUZE SPONGE 4X4 12PLY STRL (GAUZE/BANDAGES/DRESSINGS) ×3 IMPLANT
GAUZE XEROFORM 1X8 LF (GAUZE/BANDAGES/DRESSINGS) IMPLANT
GAUZE XEROFORM 5X9 LF (GAUZE/BANDAGES/DRESSINGS) IMPLANT
GLOVE BIO SURGEON STRL SZ 6.5 (GLOVE) ×9 IMPLANT
GLOVE BIO SURGEON STRL SZ8 (GLOVE) IMPLANT
GLOVE BIOGEL PI IND STRL 6.5 (GLOVE) ×2 IMPLANT
GLOVE BIOGEL PI INDICATOR 6.5 (GLOVE) ×1
GOWN STRL REUS W/ TWL LRG LVL3 (GOWN DISPOSABLE) ×6 IMPLANT
GOWN STRL REUS W/TWL LRG LVL3 (GOWN DISPOSABLE) ×3
LIQUID BAND (GAUZE/BANDAGES/DRESSINGS) IMPLANT
MANIFOLD NEPTUNE II (INSTRUMENTS) IMPLANT
MATRIX SURGICAL PSM 10X15CM (Tissue) ×3 IMPLANT
MICROMATRIX 1000MG (Tissue) ×3 IMPLANT
MICROMATRIX 500MG (Tissue) ×3 IMPLANT
NEEDLE HYPO 25X1 1.5 SAFETY (NEEDLE) IMPLANT
NS IRRIG 1000ML POUR BTL (IV SOLUTION) ×3 IMPLANT
PACK BASIN DAY SURGERY FS (CUSTOM PROCEDURE TRAY) ×3 IMPLANT
PADDING CAST ABS 3INX4YD NS (CAST SUPPLIES)
PADDING CAST ABS 4INX4YD NS (CAST SUPPLIES)
PADDING CAST ABS COTTON 3X4 (CAST SUPPLIES) IMPLANT
PADDING CAST ABS COTTON 4X4 ST (CAST SUPPLIES) IMPLANT
PENCIL BUTTON HOLSTER BLD 10FT (ELECTRODE) IMPLANT
SHEET MEDIUM DRAPE 40X70 STRL (DRAPES) ×3 IMPLANT
SLEEVE SCD COMPRESS KNEE MED (MISCELLANEOUS) IMPLANT
SOLUTION PARTIC MCRMTRX 1000MG (Tissue) ×2 IMPLANT
SOLUTION PARTIC MCRMTRX 500MG (Tissue) ×2 IMPLANT
SPLINT FIBERGLASS 3X35 (CAST SUPPLIES) IMPLANT
SPLINT FIBERGLASS 4X30 (CAST SUPPLIES) IMPLANT
SPLINT PLASTER CAST XFAST 3X15 (CAST SUPPLIES) IMPLANT
SPLINT PLASTER XTRA FASTSET 3X (CAST SUPPLIES)
SPONGE GAUZE 4X4 12PLY STER LF (GAUZE/BANDAGES/DRESSINGS) IMPLANT
SPONGE LAP 18X18 X RAY DECT (DISPOSABLE) ×3 IMPLANT
STAPLER VISISTAT 35W (STAPLE) IMPLANT
STOCKINETTE IMPERVIOUS LG (DRAPES) IMPLANT
STRIP CLOSURE SKIN 1/2X4 (GAUZE/BANDAGES/DRESSINGS) IMPLANT
SURGILUBE 2OZ TUBE FLIPTOP (MISCELLANEOUS) ×6 IMPLANT
SUT SILK 3 0 PS 1 (SUTURE) IMPLANT
SUT SILK 4 0 PS 2 (SUTURE) IMPLANT
SUT VIC AB 5-0 PS2 18 (SUTURE) IMPLANT
SYR BULB IRRIGATION 50ML (SYRINGE) ×3 IMPLANT
SYR CONTROL 10ML LL (SYRINGE) IMPLANT
TAPE HYPAFIX 6X30 (GAUZE/BANDAGES/DRESSINGS) IMPLANT
TOWEL OR 17X24 6PK STRL BLUE (TOWEL DISPOSABLE) ×3 IMPLANT
TRAY DSU PREP LF (CUSTOM PROCEDURE TRAY) ×3 IMPLANT
TUBE CONNECTING 20X1/4 (TUBING) ×3 IMPLANT
UNDERPAD 30X30 (UNDERPADS AND DIAPERS) ×3 IMPLANT
YANKAUER SUCT BULB TIP NO VENT (SUCTIONS) ×3 IMPLANT

## 2015-11-01 NOTE — Discharge Instructions (Signed)
Leave VAC in place till follow up appointment with Dr. Marla Roe.

## 2015-11-01 NOTE — Interval H&P Note (Signed)
History and Physical Interval Note:  11/01/2015 7:22 AM  Leon Sharp  has presented today for surgery, with the diagnosis of right leg wound  The various methods of treatment have been discussed with the patient and family. After consideration of risks, benefits and other options for treatment, the patient has consented to  Procedure(s): IRRIGATION AND DEBRIDEMENT leg (Right) APPLICATION OF A-CELL OF leg (Right) as a surgical intervention .  The patient's history has been reviewed, patient examined, no change in status, stable for surgery.  I have reviewed the patient's chart and labs.  Questions were answered to the patient's satisfaction.     Wallace Going

## 2015-11-01 NOTE — Brief Op Note (Signed)
11/01/2015  9:18 AM  PATIENT:  Leon Sharp  82 y.o. male  PRE-OPERATIVE DIAGNOSIS:  right leg wound  POST-OPERATIVE DIAGNOSIS:  right leg wound  PROCEDURE:  Procedure(s): IRRIGATION AND DEBRIDEMENT leg (Right) APPLICATION OF A-CELL OF leg (Right)  SURGEON:  Surgeon(s) and Role:    * Loel Lofty Dillingham, DO - Primary  PHYSICIAN ASSISTANT: Shawn Rayburn, PA  ASSISTANTS: none   ANESTHESIA:   local  EBL:     BLOOD ADMINISTERED:none  DRAINS: none   LOCAL MEDICATIONS USED:  LIDOCAINE   SPECIMEN:  No Specimen  DISPOSITION OF SPECIMEN:  N/A  COUNTS:  YES  TOURNIQUET:    DICTATION: .Dragon Dictation  PLAN OF CARE: Discharge to home after PACU  PATIENT DISPOSITION:  PACU - hemodynamically stable.   Delay start of Pharmacological VTE agent (>24hrs) due to surgical blood loss or risk of bleeding: no

## 2015-11-01 NOTE — H&P (View-Only) (Signed)
Leon Sharp is an 82 y.o. male.   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 He is not sure how long he has had the lesion but likely several months. He has a long history of skin cancer and is seen by Dr. Harvel Quale. The plan is for mohs excision of the lesion and then we will take him to the OR for partial closure and placement of Acell and VAC dressing. He has significant lymphedema of the legs and likely peripheral vascular disease. The area is at the midportion of the anterior right leg. It is 2 x 3 cm in diameter. There are several other wounds on the leg proximally and distally that look like the peripheral vascular disease. He is taking Plavix (stents) but can come off for short periods of time. The pedal pulse is weak but present. He is going to come off his Plavix for several days prior to the procedure. He ambulates with a rolling walker and has Parkinson's disease and some dementia.   Past Medical History  Diagnosis Date  . High cholesterol   . Cancer ([place])   . Skin cancer   . Angina   . COPD (chronic obstructive pulmonary disease) ([place])   . Blood transfusion   . Clotting disorder ([place])   . Dementia   . Parkinson's disease ([place])   . Hypertension   . Diabetes mellitus     IDDM  . HOH (hard of hearing)     wears hearing aids sometimes  . Chronic kidney disease   . Coronary artery disease     CABG 1972, redo 1997  . Myocardial infarction [facility])     multiple stents since CABG  . Arthritis   . GERD (gastroesophageal reflux disease)     Past Surgical History  Procedure Laterality Date  . Cardiac surgery    . Cholecystectomy    . Back surgery    . Colon surgery    . Coronary artery bypass graft  1972, redo 1997    No family history on file. Social  History:  reports that he has quit smoking. He does not have any smokeless tobacco history on file. He reports that he drinks about 0.6 oz of alcohol per week. He reports that he does not use illicit drugs.  Allergies: No Known Allergies   (Not in a hospital admission)  No results found for this or any previous visit (from the past 48 hour(s)). No results found.  Review of Systems  Constitutional: Negative.   HENT: Negative.   Eyes: Negative.   Respiratory: Negative.   Cardiovascular: Negative.   Gastrointestinal: Negative.   Genitourinary: Negative.   Musculoskeletal: Negative.   Skin: Negative.   Neurological: Negative.   Psychiatric/Behavioral: Negative.     There were no vitals taken for this visit. Physical Exam  Constitutional: He is oriented to person, place, and time. He appears well-developed and well-nourished.  HENT:  Head: Normocephalic and atraumatic.  Eyes: Conjunctivae and EOM are normal. Pupils are equal, round, and reactive to light.  Cardiovascular: Normal rate.   Respiratory: Effort normal.  GI: Soft. He exhibits no distension.  Musculoskeletal:       Legs: Neurological: He is alert and oriented to person, place, and time.  Skin: Skin is warm.  Psychiatric: He has  a normal mood and affect. His behavior is normal. Judgment and thought content normal.     Assessment/Plan Plan for at least partial closure of mohs defect of right anterior calf with possible Acell and VAC placement and Acell placement , possible VAC placement to chronic right distal ulceration.  The risks that can be encountered with and after excision of a skin lesion and placement of Acell and VAC dressing were discussed and include the following but not limited to these: bleeding, infection, delayed healing, anesthesia risks, skin sensation changes, injury to structures including nerves, blood vessels, and muscles which may be temporary or permanent, allergies to tape, suture materials and  glues, blood products, topical preparations or injected agents, skin contour irregularities, skin discoloration and swelling, deep vein thrombosis, cardiac and pulmonary complications, pain, which may persist, persistent pain, recurrence of the lesion, poor healing of the incision, possible need for revisional surgery or staged procedures. The daughter and patient is agreeable to surgery at this time and consent was obtained.   Wallace Going, DO 10/15/2015, 2:45 PM

## 2015-11-01 NOTE — Op Note (Signed)
Operative Note   DATE OF OPERATION: 11/01/2015  LOCATION: [place]  SURGICAL DIVISION: Plastic Surgery  PREOPERATIVE DIAGNOSES:  Right leg skin cancer mohs defects distal 5 x 5 cm, proximal 5 x 5 cm and middle 6 x 8 cm  POSTOPERATIVE DIAGNOSES:  same  PROCEDURE:  Preparation for placement of Acell (Powder 1.5 gm and sheet 10 x 15 cm) to right leg and VAC placement  SURGEON: Claire Sanger Dillingham, DO  ASSISTANT: Shawn Rayburn, PA  ANESTHESIA:  General.   COMPLICATIONS: None.   INDICATIONS FOR PROCEDURE:  The patient, Leon Sharp is a 82 y.o. male born on October 12, 1921, is here for treatment of mohs defects after excision of skin cancer to right leg.  MRN: [id number]  CONSENT:  Informed consent was obtained directly from the patient. Risks, benefits and alternatives were fully discussed. Specific risks including but not limited to bleeding, infection, hematoma, seroma, scarring, pain, infection, contracture, asymmetry, wound healing problems, and need for further surgery were all discussed. The patient did have an ample opportunity to have questions answered to satisfaction.   DESCRIPTION OF PROCEDURE:  The patient was taken to the operating room. SCD was placed on the left leg and IV antibiotics were given. The patient's operative site was prepped and draped in a sterile fashion. A time out was performed and all information was confirmed to be correct.  The currette was used to debride the wounds.  Antibiotic solution and saline was used to irrigate the wound.  All the Acell powder was placed at each of the three sites.  All of the 10 x 15 cm sheet and powder was used on the wounds.  The sheets were not secured with vicryl due to pain.  The distal wound and proximal wounds were 5 x 5 cm each and the middle was 6 x 8 cm.  The adaptic and duoderm were placed.  The KY gel and VAC were used to cover the wounds.  There was an excellent seal.  The patient  tolerated the procedure well.  There were no complications. The patient was allowed to wake from anesthesia, extubated and taken to the recovery room in satisfactory condition.

## 2015-11-02 ENCOUNTER — Encounter (HOSPITAL_BASED_OUTPATIENT_CLINIC_OR_DEPARTMENT_OTHER): Payer: Self-pay | Admitting: Plastic Surgery

## 2015-11-30 ENCOUNTER — Encounter (HOSPITAL_BASED_OUTPATIENT_CLINIC_OR_DEPARTMENT_OTHER): Payer: Self-pay | Admitting: *Deleted

## 2015-12-04 ENCOUNTER — Other Ambulatory Visit: Payer: Self-pay | Admitting: Plastic Surgery

## 2015-12-04 DIAGNOSIS — S81801A Unspecified open wound, right lower leg, initial encounter: Secondary | ICD-10-CM

## 2015-12-05 ENCOUNTER — Encounter (HOSPITAL_BASED_OUTPATIENT_CLINIC_OR_DEPARTMENT_OTHER): Payer: Self-pay | Admitting: Plastic Surgery

## 2015-12-05 ENCOUNTER — Encounter (HOSPITAL_BASED_OUTPATIENT_CLINIC_OR_DEPARTMENT_OTHER): Payer: Self-pay | Admitting: Anesthesiology

## 2015-12-05 ENCOUNTER — Encounter (HOSPITAL_BASED_OUTPATIENT_CLINIC_OR_DEPARTMENT_OTHER)
Admission: RE | Disposition: A | Discharge status: Home / Self Care | Payer: Self-pay | Source: Ambulatory Visit | Attending: Plastic Surgery

## 2015-12-05 ENCOUNTER — Ambulatory Visit (HOSPITAL_BASED_OUTPATIENT_CLINIC_OR_DEPARTMENT_OTHER)
Admission: RE | Admit: 2015-12-05 | Discharge: 2015-12-05 | Disposition: A | Discharge status: Home / Self Care | Payer: Medicare Other | Source: Ambulatory Visit | Attending: Plastic Surgery | Admitting: Plastic Surgery

## 2015-12-05 DIAGNOSIS — N189 Chronic kidney disease, unspecified: Secondary | ICD-10-CM | POA: Diagnosis not present

## 2015-12-05 DIAGNOSIS — I2510 Atherosclerotic heart disease of native coronary artery without angina pectoris: Secondary | ICD-10-CM | POA: Insufficient documentation

## 2015-12-05 DIAGNOSIS — Z87891 Personal history of nicotine dependence: Secondary | ICD-10-CM | POA: Diagnosis not present

## 2015-12-05 DIAGNOSIS — K219 Gastro-esophageal reflux disease without esophagitis: Secondary | ICD-10-CM | POA: Diagnosis not present

## 2015-12-05 DIAGNOSIS — Z794 Long term (current) use of insulin: Secondary | ICD-10-CM | POA: Insufficient documentation

## 2015-12-05 DIAGNOSIS — Z7982 Long term (current) use of aspirin: Secondary | ICD-10-CM | POA: Insufficient documentation

## 2015-12-05 DIAGNOSIS — Z951 Presence of aortocoronary bypass graft: Secondary | ICD-10-CM | POA: Insufficient documentation

## 2015-12-05 DIAGNOSIS — H9190 Unspecified hearing loss, unspecified ear: Secondary | ICD-10-CM | POA: Diagnosis not present

## 2015-12-05 DIAGNOSIS — E7800 Pure hypercholesterolemia, unspecified: Secondary | ICD-10-CM | POA: Diagnosis not present

## 2015-12-05 DIAGNOSIS — C4490 Unspecified malignant neoplasm of skin, unspecified: Secondary | ICD-10-CM | POA: Insufficient documentation

## 2015-12-05 DIAGNOSIS — J449 Chronic obstructive pulmonary disease, unspecified: Secondary | ICD-10-CM | POA: Insufficient documentation

## 2015-12-05 DIAGNOSIS — I129 Hypertensive chronic kidney disease with stage 1 through stage 4 chronic kidney disease, or unspecified chronic kidney disease: Secondary | ICD-10-CM | POA: Insufficient documentation

## 2015-12-05 DIAGNOSIS — M1990 Unspecified osteoarthritis, unspecified site: Secondary | ICD-10-CM | POA: Diagnosis not present

## 2015-12-05 DIAGNOSIS — Z79899 Other long term (current) drug therapy: Secondary | ICD-10-CM | POA: Diagnosis not present

## 2015-12-05 DIAGNOSIS — I252 Old myocardial infarction: Secondary | ICD-10-CM | POA: Diagnosis not present

## 2015-12-05 DIAGNOSIS — G20 Parkinson's disease: Secondary | ICD-10-CM | POA: Insufficient documentation

## 2015-12-05 DIAGNOSIS — Z7951 Long term (current) use of inhaled steroids: Secondary | ICD-10-CM | POA: Diagnosis not present

## 2015-12-05 DIAGNOSIS — E1122 Type 2 diabetes mellitus with diabetic chronic kidney disease: Secondary | ICD-10-CM | POA: Insufficient documentation

## 2015-12-05 DIAGNOSIS — F0280 Dementia in other diseases classified elsewhere without behavioral disturbance: Secondary | ICD-10-CM | POA: Insufficient documentation

## 2015-12-05 DIAGNOSIS — S81801A Unspecified open wound, right lower leg, initial encounter: Secondary | ICD-10-CM | POA: Diagnosis present

## 2015-12-05 HISTORY — PX: SKIN GRAFT: SHX250

## 2015-12-05 LAB — GLUCOSE, CAPILLARY: Glucose-Capillary: 147 mg/dL — ABNORMAL HIGH (ref 65–99)

## 2015-12-05 SURGERY — MINOR SPLIT THICKNESS SKIN GRAFT
Anesthesia: LOCAL | Laterality: Right

## 2015-12-05 MED ORDER — BUPIVACAINE-EPINEPHRINE (PF) 0.25% -1:200000 IJ SOLN
INTRAMUSCULAR | Status: AC
  Filled 2015-12-05: qty 30

## 2015-12-05 MED ORDER — BUPIVACAINE-EPINEPHRINE 0.25% -1:200000 IJ SOLN
INTRAMUSCULAR | Status: DC | PRN
  Administered 2015-12-05: 20 mL

## 2015-12-05 MED ORDER — CEFAZOLIN SODIUM-DEXTROSE 2-4 GM/100ML-% IV SOLN: 2.0000 g | INTRAVENOUS | Status: DC

## 2015-12-05 MED ORDER — BACITRACIN-NEOMYCIN-POLYMYXIN 400-5-5000 EX OINT
TOPICAL_OINTMENT | CUTANEOUS | Status: AC
  Filled 2015-12-05: qty 1

## 2015-12-05 MED ORDER — BACITRACIN ZINC 500 UNIT/GM EX OINT
TOPICAL_OINTMENT | CUTANEOUS | Status: AC
  Filled 2015-12-05: qty 28.35

## 2015-12-05 MED ORDER — CHLORHEXIDINE GLUCONATE CLOTH 2 % EX PADS: 6.0000 | MEDICATED_PAD | Freq: Once | CUTANEOUS | Status: DC

## 2015-12-05 MED ORDER — HYDROCODONE-ACETAMINOPHEN 5-325 MG PO TABS: 0.5000 | ORAL_TABLET | Freq: Four times a day (QID) | ORAL | Status: DC | PRN

## 2015-12-05 SURGICAL SUPPLY — 65 items
BAG DECANTER FOR FLEXI CONT (MISCELLANEOUS) IMPLANT
BANDAGE ACE 3X5.8 VEL STRL LF (GAUZE/BANDAGES/DRESSINGS) IMPLANT
BANDAGE ACE 4X5 VEL STRL LF (GAUZE/BANDAGES/DRESSINGS) ×4 IMPLANT
BANDAGE ACE 6X5 VEL STRL LF (GAUZE/BANDAGES/DRESSINGS) IMPLANT
BLADE CLIPPER SURG (BLADE) IMPLANT
BLADE DERMATOME SS (BLADE) ×2 IMPLANT
BLADE HEX COATED 2.75 (ELECTRODE) IMPLANT
BLADE SURG 10 STRL SS (BLADE) IMPLANT
BLADE SURG 15 STRL LF DISP TIS (BLADE) ×1 IMPLANT
BLADE SURG 15 STRL SS (BLADE) ×1
BNDG GAUZE ELAST 4 BULKY (GAUZE/BANDAGES/DRESSINGS) ×4 IMPLANT
COTTONBALL LRG STERILE PKG (GAUZE/BANDAGES/DRESSINGS) IMPLANT
COVER BACK TABLE 60X90IN (DRAPES) ×2 IMPLANT
COVER MAYO STAND STRL (DRAPES) ×2 IMPLANT
DECANTER SPIKE VIAL GLASS SM (MISCELLANEOUS) IMPLANT
DERMABOND ADVANCED (GAUZE/BANDAGES/DRESSINGS)
DERMABOND ADVANCED .7 DNX12 (GAUZE/BANDAGES/DRESSINGS) IMPLANT
DERMACARRIERS GRAFT 1 TO 1.5 (DISPOSABLE)
DRAPE INCISE IOBAN 66X45 STRL (DRAPES) ×2 IMPLANT
DRAPE U-SHAPE 76X120 STRL (DRAPES) ×2 IMPLANT
DRESSING DUODERM 4X4 STERILE (GAUZE/BANDAGES/DRESSINGS) ×4 IMPLANT
DRSG ADAPTIC 3X8 NADH LF (GAUZE/BANDAGES/DRESSINGS) ×4 IMPLANT
DRSG EMULSION OIL 3X3 NADH (GAUZE/BANDAGES/DRESSINGS) IMPLANT
DRSG OPSITE 6X11 MED (GAUZE/BANDAGES/DRESSINGS) IMPLANT
DRSG PAD ABDOMINAL 8X10 ST (GAUZE/BANDAGES/DRESSINGS) IMPLANT
DRSG TEGADERM 4X10 (GAUZE/BANDAGES/DRESSINGS) IMPLANT
ELECT REM PT RETURN 9FT ADLT (ELECTROSURGICAL)
ELECTRODE REM PT RTRN 9FT ADLT (ELECTROSURGICAL) IMPLANT
GAUZE SPONGE 4X4 12PLY STRL (GAUZE/BANDAGES/DRESSINGS) IMPLANT
GAUZE XEROFORM 5X9 LF (GAUZE/BANDAGES/DRESSINGS) IMPLANT
GLOVE BIO SURGEON STRL SZ 6.5 (GLOVE) ×2 IMPLANT
GOWN STRL REUS W/ TWL LRG LVL3 (GOWN DISPOSABLE) ×2 IMPLANT
GOWN STRL REUS W/ TWL XL LVL3 (GOWN DISPOSABLE) ×1 IMPLANT
GOWN STRL REUS W/TWL LRG LVL3 (GOWN DISPOSABLE) ×2
GOWN STRL REUS W/TWL XL LVL3 (GOWN DISPOSABLE) ×1
GRAFT DERMACARRIERS 1 TO 1.5 (DISPOSABLE) IMPLANT
MATRIX SURGICAL PSM 7X10CM (Tissue) ×2 IMPLANT
NEEDLE HYPO 25X1 1.5 SAFETY (NEEDLE) ×2 IMPLANT
NS IRRIG 1000ML POUR BTL (IV SOLUTION) ×2 IMPLANT
PACK BASIN DAY SURGERY FS (CUSTOM PROCEDURE TRAY) ×2 IMPLANT
PADDING CAST ABS 3INX4YD NS (CAST SUPPLIES)
PADDING CAST ABS 4INX4YD NS (CAST SUPPLIES)
PADDING CAST ABS COTTON 3X4 (CAST SUPPLIES) IMPLANT
PADDING CAST ABS COTTON 4X4 ST (CAST SUPPLIES) IMPLANT
PENCIL BUTTON HOLSTER BLD 10FT (ELECTRODE) IMPLANT
SHEET MEDIUM DRAPE 40X70 STRL (DRAPES) ×2 IMPLANT
SPLINT FIBERGLASS 3X35 (CAST SUPPLIES) ×2 IMPLANT
SPLINT FIBERGLASS 4X30 (CAST SUPPLIES) IMPLANT
SPONGE LAP 18X18 X RAY DECT (DISPOSABLE) ×2 IMPLANT
STAPLER VISISTAT 35W (STAPLE) IMPLANT
SURGILUBE 2OZ TUBE FLIPTOP (MISCELLANEOUS) IMPLANT
SUT MON AB 5-0 PS2 18 (SUTURE) IMPLANT
SUT SILK 3 0 SH CR/8 (SUTURE) IMPLANT
SUT SILK 4 0 SH CR/8 (SUTURE) IMPLANT
SUT VIC AB 5-0 P-3 18X BRD (SUTURE) ×2 IMPLANT
SUT VIC AB 5-0 P3 18 (SUTURE) ×2
SUT VIC AB 5-0 PS2 18 (SUTURE) IMPLANT
SUT VICRYL 4-0 PS2 18IN ABS (SUTURE) IMPLANT
SYR BULB IRRIGATION 50ML (SYRINGE) ×2 IMPLANT
SYR CONTROL 10ML LL (SYRINGE) ×2 IMPLANT
TOWEL OR 17X24 6PK STRL BLUE (TOWEL DISPOSABLE) ×2 IMPLANT
TRAY DSU PREP LF (CUSTOM PROCEDURE TRAY) ×2 IMPLANT
TUBE CONNECTING 20X1/4 (TUBING) ×2 IMPLANT
UNDERPAD 30X30 (UNDERPADS AND DIAPERS) ×2 IMPLANT
YANKAUER SUCT BULB TIP NO VENT (SUCTIONS) ×2 IMPLANT

## 2015-12-05 NOTE — Brief Op Note (Signed)
12/05/2015  1:57 PM  PATIENT:  Valentina Gu  82 y.o. male  PRE-OPERATIVE DIAGNOSIS:  RIGHT LOWER LEG WOUND  POST-OPERATIVE DIAGNOSIS:  Same  PROCEDURE:  Procedure(s): SPLIT THICKNESS SKIN GRAFT RIGHT LOWER LEG   (MINOR PROCEDURE)  (Right)  SURGEON:  Surgeon(s) and Role:    * Loel Lofty Dillingham, DO - Primary  PHYSICIAN ASSISTANT: Shawn Rayburn, PA  ASSISTANTS: none   ANESTHESIA:   local  EBL:     BLOOD ADMINISTERED:none  DRAINS: none   LOCAL MEDICATIONS USED:  MARCAINE     SPECIMEN:  No Specimen  DISPOSITION OF SPECIMEN:  N/A  COUNTS:  YES  TOURNIQUET:  * No tourniquets in log *  DICTATION: .Dragon Dictation  PLAN OF CARE: Discharge to home after PACU  PATIENT DISPOSITION:  PACU - hemodynamically stable.   Delay start of Pharmacological VTE agent (>24hrs) due to surgical blood loss or risk of bleeding: no

## 2015-12-05 NOTE — H&P (Signed)
Leon Sharp is an 82 y.o. male.   Chief Complaint: right leg wound HPI: The patient is a 82 yrs old wm here with family for further treatment of a right leg wound.  He underwent excision via mohs technique of a squamous cell carcinoma.  He had several challenges with his healing.  He underwent Acell placement with marked improvement.  He now presents for further treatment.  Past Medical History  Diagnosis Date  . High cholesterol   . Cancer ([place])   . Skin cancer   . Angina   . COPD (chronic obstructive pulmonary disease) ([place])   . Blood transfusion   . Clotting disorder ([place])   . Dementia   . Parkinson's disease ([place])   . Hypertension   . Diabetes mellitus     IDDM  . HOH (hard of hearing)     wears hearing aids sometimes  . Chronic kidney disease   . Coronary artery disease     CABG 1972, redo 1997  . Myocardial infarction [facility])     multiple stents since CABG  . Arthritis   . GERD (gastroesophageal reflux disease)     Past Surgical History  Procedure Laterality Date  . Cardiac surgery    . Cholecystectomy    . Back surgery    . Colon surgery    . Coronary artery bypass graft  1972, redo 1997  . Application of wound vac Right 10/17/2015    Procedure: A CELL AND WOUND VAC PLACEMENT RIGHT LEG WOUND;  Surgeon: Wallace Going, DO;  Location: [place];  Service: Plastics;  Laterality: Right;  . Minor irrigation and debridement of wound Right 11/01/2015    Procedure: IRRIGATION AND DEBRIDEMENT leg;  Surgeon: Wallace Going, DO;  Location: [place];  Service: Plastics;  Laterality: Right;  . Minor application of wound vac Right 11/01/2015    Procedure: APPLICATION OF A-CELL OF leg and wound vac application;  Surgeon: Wallace Going, DO;  Location: [place];  Service: Plastics;  Laterality: Right;    History reviewed. No pertinent family history. Social History:  reports that he has quit smoking. He does not  have any smokeless tobacco history on file. He reports that he drinks about 0.6 oz of alcohol per week. He reports that he does not use illicit drugs.  Allergies: No Known Allergies  Medications Prior to Admission  Medication Sig Dispense Refill  . aspirin 81 MG tablet Take 81 mg by mouth 2 (two) times daily.    . carbidopa-levodopa (PARCOPA) 25-100 MG disintegrating tablet Take 1 tablet by mouth 3 (three) times daily.    . celecoxib (CELEBREX) 100 MG capsule Take 100 mg by mouth 2 (two) times daily.    . clopidogrel (PLAVIX) 75 MG tablet Take 1 tablet (75 mg total) by mouth daily.    . cyanocobalamin (,VITAMIN B-12,) 1000 MCG/ML injection Inject 1,000 mcg into the muscle every 30 (thirty) days.     Marland Kitchen donepezil (ARICEPT) 5 MG tablet Take 5 mg by mouth at bedtime.    . DULoxetine (CYMBALTA) 60 MG capsule Take 40 mg by mouth daily.     . fludrocortisone (FLORINEF) 0.1 MG tablet Take 0.1 mg by mouth daily.    . insulin glargine (LANTUS) 100 UNIT/ML injection Inject 32 Units into the skin at bedtime.     . insulin lispro (HUMALOG) 100 UNIT/ML injection Inject 55 Units into the skin 3 (three) times daily with meals.    Marland Kitchen  memantine (NAMENDA) 5 MG tablet Take 28 mg by mouth daily.     . metoprolol tartrate (LOPRESSOR) 25 MG tablet Take 25 mg by mouth 2 (two) times daily.    . nitroGLYCERIN (NITRO-DUR) 0.8 MG/HR Place 1 patch onto the skin daily.     . nitroGLYCERIN (NITROSTAT) 0.4 MG SL tablet Place 0.4 mg under the tongue every 5 (five) minutes as needed. For chest pain      . Omega-3 Fatty Acids (FISH OIL) 1200 MG CAPS Take 1 capsule by mouth daily.     . pantoprazole (PROTONIX) 40 MG tablet Take 40 mg by mouth daily.    . pravastatin (PRAVACHOL) 20 MG tablet Take 40 mg by mouth daily.     . sitaGLIPtin (JANUVIA) 25 MG tablet Take 50 mg by mouth daily.     . Tamsulosin HCl (FLOMAX) 0.4 MG CAPS Take 0.4 mg by mouth daily.      Marland Kitchen umeclidinium-vilanterol (ANORO ELLIPTA) 62.5-25 MCG/INH AEPB Inhale  1 puff into the lungs daily.    . vitamin C (ASCORBIC ACID) 500 MG tablet Take 500 mg by mouth daily.      No results found for this or any previous visit (from the past 48 hour(s)). No results found.  Review of Systems  Constitutional: Negative.   HENT: Negative.   Eyes: Negative.   Respiratory: Negative.   Cardiovascular: Negative.   Gastrointestinal: Negative.   Genitourinary: Negative.   Musculoskeletal: Negative.   Skin: Negative.   Neurological: Negative.   Psychiatric/Behavioral: Negative.     There were no vitals taken for this visit. Physical Exam  Constitutional: He is oriented to person, place, and time. He appears well-developed and well-nourished.  HENT:  Head: Normocephalic and atraumatic.  Cardiovascular: Normal rate.   Respiratory: Effort normal. No respiratory distress.  Neurological: He is alert and oriented to person, place, and time.  Skin: Skin is warm.  Psychiatric: He has a normal mood and affect. His behavior is normal. Judgment and thought content normal.     Assessment/Plan Plan for split thickness skin graft to the right leg.  Wallace Going, DO 12/05/2015, 12:45 PM

## 2015-12-05 NOTE — Op Note (Signed)
Operative Note   DATE OF OPERATION: 12/05/2015  LOCATION: [place]  SURGICAL DIVISION: Plastic Surgery  PREOPERATIVE DIAGNOSES:  Right leg skin cancer mohs defects distal 4 x 5 cm, proximal 4 x 5 cm and middle 5 x 7 cm  POSTOPERATIVE DIAGNOSES:  same  PROCEDURE:  Placement of Split thickness skin graft to right leg wounds (70 cm2) and Acell (sheet 7 x 10 cm) to donor site, VAC placement  SURGEON: Lyndee Leo Sanger Lisseth Brazeau, DO  ASSISTANT: Shawn Rayburn, PA  ANESTHESIA:  General.   COMPLICATIONS: None.   INDICATIONS FOR PROCEDURE:  The patient, Leon Sharp is a 82 y.o. male born on 1922-01-01, is here for treatment of mohs defects after excision of skin cancer to right leg.  MRN: [id number]  CONSENT:  Informed consent was obtained directly from the patient. Risks, benefits and alternatives were fully discussed. Specific risks including but not limited to bleeding, infection, hematoma, seroma, scarring, pain, infection, contracture, asymmetry, wound healing problems, and need for further surgery were all discussed. The patient did have an ample opportunity to have questions answered to satisfaction.   DESCRIPTION OF PROCEDURE:  The patient was taken to the operating room. SCD was placed on the left leg and IV antibiotics were given. The patient's operative site was prepped and draped in a sterile fashion. A time out was performed and all information was confirmed to be correct.  The right leg was irrigated with saline.  The right thigh was injected with local for intraoperative hemostasis and post operative pain control.  The Dermatome was set at 05/999 inch.  The graft was obtained and the donor site was covered with the  Acell sheet (7 x 10 cm). It was secured in place with 5-0 Vicryl, covered with adaptic and ky gel.  The wounds of the right leg (4 x 5 cm, proximal 4 x 5 cm and middle 5 x 7 cm) were covered with the skin graft and secured in place with  evicel. The adaptic and duoderm were placed.  The VAC was used to cover the wounds.  There was an excellent seal.  The leg was placed in a plantar blocking splint for security of the graft.  The patient tolerated the procedure well.  There were no complications. The patient was taken to the recovery room in satisfactory condition.

## 2015-12-06 ENCOUNTER — Encounter (HOSPITAL_BASED_OUTPATIENT_CLINIC_OR_DEPARTMENT_OTHER): Payer: Self-pay | Admitting: Plastic Surgery

## 2015-12-06 MED ORDER — EVICEL 2 ML EX KIT
PACK | CUTANEOUS | Status: DC | PRN
  Administered 2015-12-05: 1

## 2016-02-15 ENCOUNTER — Encounter (HOSPITAL_BASED_OUTPATIENT_CLINIC_OR_DEPARTMENT_OTHER): Payer: Medicare Other | Attending: Internal Medicine

## 2016-02-15 DIAGNOSIS — E1140 Type 2 diabetes mellitus with diabetic neuropathy, unspecified: Secondary | ICD-10-CM | POA: Diagnosis not present

## 2016-02-15 DIAGNOSIS — L97811 Non-pressure chronic ulcer of other part of right lower leg limited to breakdown of skin: Secondary | ICD-10-CM | POA: Diagnosis not present

## 2016-02-15 DIAGNOSIS — M1990 Unspecified osteoarthritis, unspecified site: Secondary | ICD-10-CM | POA: Insufficient documentation

## 2016-02-15 DIAGNOSIS — J449 Chronic obstructive pulmonary disease, unspecified: Secondary | ICD-10-CM | POA: Insufficient documentation

## 2016-02-15 DIAGNOSIS — I2510 Atherosclerotic heart disease of native coronary artery without angina pectoris: Secondary | ICD-10-CM | POA: Insufficient documentation

## 2016-02-15 DIAGNOSIS — I252 Old myocardial infarction: Secondary | ICD-10-CM | POA: Diagnosis not present

## 2016-02-15 DIAGNOSIS — Z85828 Personal history of other malignant neoplasm of skin: Secondary | ICD-10-CM | POA: Diagnosis not present

## 2016-02-15 DIAGNOSIS — I509 Heart failure, unspecified: Secondary | ICD-10-CM | POA: Diagnosis not present

## 2016-02-15 DIAGNOSIS — I87331 Chronic venous hypertension (idiopathic) with ulcer and inflammation of right lower extremity: Secondary | ICD-10-CM | POA: Insufficient documentation

## 2016-02-22 ENCOUNTER — Encounter (HOSPITAL_BASED_OUTPATIENT_CLINIC_OR_DEPARTMENT_OTHER): Payer: Medicare Other | Attending: Internal Medicine

## 2016-02-22 DIAGNOSIS — E1140 Type 2 diabetes mellitus with diabetic neuropathy, unspecified: Secondary | ICD-10-CM | POA: Insufficient documentation

## 2016-02-22 DIAGNOSIS — J449 Chronic obstructive pulmonary disease, unspecified: Secondary | ICD-10-CM | POA: Diagnosis not present

## 2016-02-22 DIAGNOSIS — I87331 Chronic venous hypertension (idiopathic) with ulcer and inflammation of right lower extremity: Secondary | ICD-10-CM | POA: Diagnosis present

## 2016-02-22 DIAGNOSIS — I2510 Atherosclerotic heart disease of native coronary artery without angina pectoris: Secondary | ICD-10-CM | POA: Insufficient documentation

## 2016-02-22 DIAGNOSIS — I252 Old myocardial infarction: Secondary | ICD-10-CM | POA: Insufficient documentation

## 2016-02-22 DIAGNOSIS — I110 Hypertensive heart disease with heart failure: Secondary | ICD-10-CM | POA: Insufficient documentation

## 2016-02-22 DIAGNOSIS — Z85828 Personal history of other malignant neoplasm of skin: Secondary | ICD-10-CM | POA: Insufficient documentation

## 2016-02-22 DIAGNOSIS — L97811 Non-pressure chronic ulcer of other part of right lower leg limited to breakdown of skin: Secondary | ICD-10-CM | POA: Diagnosis not present

## 2016-02-22 DIAGNOSIS — I509 Heart failure, unspecified: Secondary | ICD-10-CM | POA: Diagnosis not present

## 2016-02-29 DIAGNOSIS — I87331 Chronic venous hypertension (idiopathic) with ulcer and inflammation of right lower extremity: Secondary | ICD-10-CM | POA: Diagnosis not present

## 2016-06-30 ENCOUNTER — Emergency Department (HOSPITAL_COMMUNITY): Payer: Medicare Other

## 2016-06-30 ENCOUNTER — Encounter (HOSPITAL_COMMUNITY): Payer: Self-pay

## 2016-06-30 ENCOUNTER — Emergency Department (HOSPITAL_COMMUNITY)
Admission: EM | Admit: 2016-06-30 | Discharge: 2016-06-30 | Disposition: A | Discharge status: Home / Self Care | Payer: Medicare Other | Attending: Emergency Medicine | Admitting: Emergency Medicine

## 2016-06-30 DIAGNOSIS — Y999 Unspecified external cause status: Secondary | ICD-10-CM | POA: Diagnosis not present

## 2016-06-30 DIAGNOSIS — Z951 Presence of aortocoronary bypass graft: Secondary | ICD-10-CM | POA: Diagnosis not present

## 2016-06-30 DIAGNOSIS — I129 Hypertensive chronic kidney disease with stage 1 through stage 4 chronic kidney disease, or unspecified chronic kidney disease: Secondary | ICD-10-CM | POA: Insufficient documentation

## 2016-06-30 DIAGNOSIS — N189 Chronic kidney disease, unspecified: Secondary | ICD-10-CM | POA: Insufficient documentation

## 2016-06-30 DIAGNOSIS — Z794 Long term (current) use of insulin: Secondary | ICD-10-CM | POA: Insufficient documentation

## 2016-06-30 DIAGNOSIS — E1122 Type 2 diabetes mellitus with diabetic chronic kidney disease: Secondary | ICD-10-CM | POA: Insufficient documentation

## 2016-06-30 DIAGNOSIS — L03113 Cellulitis of right upper limb: Secondary | ICD-10-CM | POA: Insufficient documentation

## 2016-06-30 DIAGNOSIS — Z85828 Personal history of other malignant neoplasm of skin: Secondary | ICD-10-CM | POA: Insufficient documentation

## 2016-06-30 DIAGNOSIS — Y929 Unspecified place or not applicable: Secondary | ICD-10-CM | POA: Diagnosis not present

## 2016-06-30 DIAGNOSIS — W1830XA Fall on same level, unspecified, initial encounter: Secondary | ICD-10-CM | POA: Insufficient documentation

## 2016-06-30 DIAGNOSIS — Z7982 Long term (current) use of aspirin: Secondary | ICD-10-CM | POA: Insufficient documentation

## 2016-06-30 DIAGNOSIS — Z87891 Personal history of nicotine dependence: Secondary | ICD-10-CM | POA: Insufficient documentation

## 2016-06-30 DIAGNOSIS — Y939 Activity, unspecified: Secondary | ICD-10-CM | POA: Diagnosis not present

## 2016-06-30 DIAGNOSIS — G20 Parkinson's disease: Secondary | ICD-10-CM | POA: Insufficient documentation

## 2016-06-30 DIAGNOSIS — S0181XA Laceration without foreign body of other part of head, initial encounter: Secondary | ICD-10-CM | POA: Insufficient documentation

## 2016-06-30 DIAGNOSIS — Y92129 Unspecified place in nursing home as the place of occurrence of the external cause: Secondary | ICD-10-CM | POA: Insufficient documentation

## 2016-06-30 DIAGNOSIS — J449 Chronic obstructive pulmonary disease, unspecified: Secondary | ICD-10-CM | POA: Diagnosis not present

## 2016-06-30 DIAGNOSIS — I252 Old myocardial infarction: Secondary | ICD-10-CM | POA: Diagnosis not present

## 2016-06-30 DIAGNOSIS — Z8673 Personal history of transient ischemic attack (TIA), and cerebral infarction without residual deficits: Secondary | ICD-10-CM | POA: Insufficient documentation

## 2016-06-30 DIAGNOSIS — S0990XA Unspecified injury of head, initial encounter: Secondary | ICD-10-CM | POA: Diagnosis present

## 2016-06-30 DIAGNOSIS — S51811A Laceration without foreign body of right forearm, initial encounter: Secondary | ICD-10-CM | POA: Insufficient documentation

## 2016-06-30 DIAGNOSIS — I2510 Atherosclerotic heart disease of native coronary artery without angina pectoris: Secondary | ICD-10-CM | POA: Insufficient documentation

## 2016-06-30 LAB — CBG MONITORING, ED: GLUCOSE-CAPILLARY: 148 mg/dL — AB (ref 65–99)

## 2016-06-30 LAB — CBC WITH DIFFERENTIAL/PLATELET
Basophils Absolute: 0 10*3/uL (ref 0.0–0.1)
Basophils Relative: 0 %
Eosinophils Absolute: 0.4 10*3/uL (ref 0.0–0.7)
Eosinophils Relative: 4 %
HCT: 33.8 % — ABNORMAL LOW (ref 39.0–52.0)
HEMOGLOBIN: 10.9 g/dL — AB (ref 13.0–17.0)
LYMPHS ABS: 1.7 10*3/uL (ref 0.7–4.0)
LYMPHS PCT: 21 %
MCH: 30.9 pg (ref 26.0–34.0)
MCHC: 32.2 g/dL (ref 30.0–36.0)
MCV: 95.8 fL (ref 78.0–100.0)
MONOS PCT: 7 %
Monocytes Absolute: 0.6 10*3/uL (ref 0.1–1.0)
NEUTROS PCT: 68 %
Neutro Abs: 5.5 10*3/uL (ref 1.7–7.7)
Platelets: 139 10*3/uL — ABNORMAL LOW (ref 150–400)
RBC: 3.53 MIL/uL — ABNORMAL LOW (ref 4.22–5.81)
RDW: 14.1 % (ref 11.5–15.5)
WBC: 8.1 10*3/uL (ref 4.0–10.5)

## 2016-06-30 LAB — URINALYSIS, ROUTINE W REFLEX MICROSCOPIC
BILIRUBIN URINE: NEGATIVE
Glucose, UA: NEGATIVE mg/dL
Hgb urine dipstick: NEGATIVE
Ketones, ur: NEGATIVE mg/dL
LEUKOCYTES UA: NEGATIVE
NITRITE: NEGATIVE
Protein, ur: NEGATIVE mg/dL
SPECIFIC GRAVITY, URINE: 1.014 (ref 1.005–1.030)
pH: 6 (ref 5.0–8.0)

## 2016-06-30 LAB — BASIC METABOLIC PANEL
Anion gap: 9 (ref 5–15)
BUN: 25 mg/dL — AB (ref 6–20)
CHLORIDE: 106 mmol/L (ref 101–111)
CO2: 25 mmol/L (ref 22–32)
Calcium: 8.5 mg/dL — ABNORMAL LOW (ref 8.9–10.3)
Creatinine, Ser: 1.61 mg/dL — ABNORMAL HIGH (ref 0.61–1.24)
GFR calc Af Amer: 41 mL/min — ABNORMAL LOW (ref >60)
GFR calc non Af Amer: 35 mL/min — ABNORMAL LOW (ref >60)
GLUCOSE: 175 mg/dL — AB (ref 65–99)
Potassium: 3.9 mmol/L (ref 3.5–5.1)
Sodium: 140 mmol/L (ref 135–145)

## 2016-06-30 LAB — PROTIME-INR
INR: 1.09
Prothrombin Time: 14.1 seconds (ref 11.4–15.2)

## 2016-06-30 LAB — I-STAT CG4 LACTIC ACID, ED: Lactic Acid, Venous: 1.24 mmol/L (ref 0.5–1.9)

## 2016-06-30 MED ORDER — CEPHALEXIN 500 MG PO CAPS: 500.0000 mg | ORAL_CAPSULE | Freq: Two times a day (BID) | ORAL | 0 refills | Status: DC

## 2016-06-30 MED ORDER — TETANUS-DIPHTH-ACELL PERTUSSIS 5-2.5-18.5 LF-MCG/0.5 IM SUSP: 0.5000 mL | Freq: Once | INTRAMUSCULAR | Status: DC

## 2016-06-30 MED ORDER — CEPHALEXIN 500 MG PO CAPS: 500.0000 mg | ORAL_CAPSULE | Freq: Two times a day (BID) | ORAL | 0 refills | Status: AC
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
## 2016-06-30 NOTE — ED Notes (Signed)
Pt and daughter states she understands instructions. Pt home stable via wc.

## 2016-06-30 NOTE — Progress Notes (Signed)
CSW consult acknowledged re: family has questions about facility. CSW engaged with Patient and family at Patient's bedside. Patient's family denied having any questions about Patient's current facility, [facility]. Patient's family reports that the facility needs discharge summary and medication information/ discharge summary. RN to provide Patient's family with after visit summary.    Lorrine Kin, MSW, LCSW [facility] ED/26M Clinical Social Worker [phone]

## 2016-06-30 NOTE — ED Notes (Signed)
Pt given PO fluids and crackers

## 2016-06-30 NOTE — ED Triage Notes (Signed)
Pt arrives EMS from [facility] with c/o found on floor.with bleeding on top of head. Usually ambulates with walker and found without. Staff states he is more confused than usual though he has hx of confusion.No deformities noted. C/o puncture wound to top of head. Dried blood noted by EMS in mouth. MAE

## 2016-06-30 NOTE — Discharge Instructions (Signed)
Please take your antibiotics twice a day to help treat your cellulitis on your arm from your injury. Please keep it dressed with gauze and xeroform for the next 3 days. Please change the dressing daily. Please watch for worsening infection. Please apply bacitracin to the laceration on her head for the next 2 days. Please follow-up with a primary care physician in the next few days for wound recheck and recheck of kidney function.   Any symptoms return or worsen, please return the nearest ED.

## 2016-09-30 ENCOUNTER — Emergency Department (HOSPITAL_COMMUNITY)
Admission: EM | Admit: 2016-09-30 | Discharge: 2016-09-30 | Disposition: A | Discharge status: Home / Self Care | Payer: Medicare Other | Attending: Emergency Medicine | Admitting: Emergency Medicine

## 2016-09-30 DIAGNOSIS — R400 Somnolence: Secondary | ICD-10-CM

## 2016-09-30 DIAGNOSIS — N189 Chronic kidney disease, unspecified: Secondary | ICD-10-CM | POA: Diagnosis not present

## 2016-09-30 DIAGNOSIS — Z79899 Other long term (current) drug therapy: Secondary | ICD-10-CM | POA: Insufficient documentation

## 2016-09-30 DIAGNOSIS — Z87891 Personal history of nicotine dependence: Secondary | ICD-10-CM | POA: Insufficient documentation

## 2016-09-30 DIAGNOSIS — I129 Hypertensive chronic kidney disease with stage 1 through stage 4 chronic kidney disease, or unspecified chronic kidney disease: Secondary | ICD-10-CM | POA: Diagnosis not present

## 2016-09-30 DIAGNOSIS — I2510 Atherosclerotic heart disease of native coronary artery without angina pectoris: Secondary | ICD-10-CM | POA: Insufficient documentation

## 2016-09-30 DIAGNOSIS — Z7982 Long term (current) use of aspirin: Secondary | ICD-10-CM | POA: Diagnosis not present

## 2016-09-30 DIAGNOSIS — J449 Chronic obstructive pulmonary disease, unspecified: Secondary | ICD-10-CM | POA: Diagnosis not present

## 2016-09-30 DIAGNOSIS — I252 Old myocardial infarction: Secondary | ICD-10-CM | POA: Diagnosis not present

## 2016-09-30 DIAGNOSIS — Z951 Presence of aortocoronary bypass graft: Secondary | ICD-10-CM | POA: Diagnosis not present

## 2016-09-30 DIAGNOSIS — G20 Parkinson's disease: Secondary | ICD-10-CM | POA: Insufficient documentation

## 2016-09-30 DIAGNOSIS — F518 Other sleep disorders not due to a substance or known physiological condition: Secondary | ICD-10-CM | POA: Diagnosis present

## 2016-09-30 DIAGNOSIS — Z794 Long term (current) use of insulin: Secondary | ICD-10-CM | POA: Insufficient documentation

## 2016-09-30 DIAGNOSIS — Z85828 Personal history of other malignant neoplasm of skin: Secondary | ICD-10-CM | POA: Diagnosis not present

## 2016-09-30 LAB — CBG MONITORING, ED: Glucose-Capillary: 147 mg/dL — ABNORMAL HIGH (ref 65–99)

## 2016-09-30 NOTE — ED Triage Notes (Signed)
Pt coming from [facility] by EMS because staff state pt was difficult to arouse. Staff state it took 10 minutes to wake the pt up. Pt was awake went EMS staff arrived and alert. Pt have HX of dementia. Right eye is red but they said is normal for the pt.

## 2016-09-30 NOTE — ED Provider Notes (Signed)
[facility] DEPT Provider Note   CSN: [id number] Arrival date & time: 09/30/16  1220     History   Chief Complaint Chief Complaint  Patient presents with  . diff to arouse    HPI MONTAVIS SCHUBRING is a 83 y.o. male.  HPI Patient had a normal day and had gone to breakfast. He then went back to his room to take a nap. Patient was napping in his recliner which is typical. Aids check in on him periodically. One of the aids checked him and was unable to awaken him. Multiple staff came in and could not awaken the patient. After about 10 minutes he started to wake up. Upon EMS arrival the patient was at normal mental status. The patient reports that he does not feel any different. He has no complaints. His daughter has been talking to him in the emergency department and she reports that his mental status is clear, he has recall for events earlier in the day, he is tracking time and is aware of how long it took her to come to the hospital. She does report that sometimes when he naps he is an extremely sound sleeper and once he begins to awaken it takes several minutes for him to become reoriented to his surroundings. She reports they went out yesterday for an endocrinologist appointment on there were no problems or signs of illness. Past Medical History:  Diagnosis Date  . Angina   . Arthritis   . Blood transfusion   . Cancer ([place])   . Chronic kidney disease   . Clotting disorder ([place])   . COPD (chronic obstructive pulmonary disease) ([place])   . Coronary artery disease    CABG 1972, redo 1997  . Dementia   . Diabetes mellitus    IDDM  . GERD (gastroesophageal reflux disease)   . High cholesterol   . HOH (hard of hearing)    wears hearing aids sometimes  . Hypertension   . Myocardial infarction    multiple stents since CABG  . Parkinson's disease ([place])   . Skin cancer     Patient Active Problem List   Diagnosis Date Noted  . Leg wound, right 12/05/2015  . Fall 05/17/2011  .  Subdural hematoma ([place]) 05/15/2011  . Diabetes mellitus 05/15/2011  . Hypertension 05/15/2011  . Syncope 05/15/2011    Past Surgical History:  Procedure Laterality Date  . APPLICATION OF WOUND VAC Right 10/17/2015   Procedure: A CELL AND WOUND VAC PLACEMENT RIGHT LEG WOUND;  Surgeon: Wallace Going, DO;  Location: [facility];  Service: Plastics;  Laterality: Right;  . BACK SURGERY    . CARDIAC SURGERY    . CHOLECYSTECTOMY    . COLON SURGERY    . CORONARY ARTERY BYPASS GRAFT  1972, redo 1997  . MINOR APPLICATION OF WOUND VAC Right 11/01/2015   Procedure: APPLICATION OF A-CELL OF leg and wound vac application;  Surgeon: Wallace Going, DO;  Location: [facility];  Service: Plastics;  Laterality: Right;  . MINOR IRRIGATION AND DEBRIDEMENT OF WOUND Right 11/01/2015   Procedure: IRRIGATION AND DEBRIDEMENT leg;  Surgeon: Wallace Going, DO;  Location: [facility];  Service: Plastics;  Laterality: Right;  . SKIN GRAFT Right 12/05/2015   Procedure: SPLIT THICKNESS SKIN GRAFT RIGHT LOWER LEG   (MINOR PROCEDURE) ;  Surgeon: Wallace Going, DO;  Location: [facility];  Service: Plastics;  Laterality: Right;  Home Medications    Prior to Admission medications   Medication Sig Start Date End Date Taking? Authorizing Provider  ARTIFICIAL TEAR OP Apply 1 drop to eye 3 (three) times daily.   Yes Historical Provider, MD  aspirin 81 MG tablet Take 81 mg by mouth 2 (two) times daily.   Yes Historical Provider, MD  cyanocobalamin 500 MCG tablet Take 500 mcg by mouth daily.   Yes Historical Provider, MD  donepezil (ARICEPT) 5 MG tablet Take 5 mg by mouth at bedtime.   Yes Historical Provider, MD  DULoxetine (CYMBALTA) 20 MG capsule Take 40 mg by mouth daily.   Yes Historical Provider, MD  famotidine (PEPCID) 40 MG tablet Take 40 mg by mouth at bedtime.   Yes Historical Provider, MD  fludrocortisone (FLORINEF) 0.1 MG tablet  Take 0.1 mg by mouth daily.   Yes Historical Provider, MD  insulin glargine (LANTUS) 100 UNIT/ML injection Inject 28 Units into the skin at bedtime.    Yes Historical Provider, MD  insulin lispro (HUMALOG) 100 UNIT/ML injection Inject 8-15 Units into the skin 3 (three) times daily with meals. Sliding scale. 101-150 8 units. 151-200=10 units, 201-250=12 units, 251-300=13 units, 301-350=14 units, If >350 give 15 units and Notify MD.   Yes Historical Provider, MD  iron polysaccharides (NU-IRON) 150 MG capsule Take 150 mg by mouth daily.   Yes Historical Provider, MD  memantine (NAMENDA XR) 28 MG CP24 24 hr capsule Take 28 mg by mouth.   Yes Historical Provider, MD  metoprolol succinate (TOPROL-XL) 25 MG 24 hr tablet Take 25 mg by mouth daily.   Yes Historical Provider, MD  Omega-3 Fatty Acids (FISH OIL) 1200 MG CAPS Take 1 capsule by mouth daily.    Yes Historical Provider, MD  pantoprazole (PROTONIX) 40 MG tablet Take 40 mg by mouth 2 (two) times daily.    Yes Historical Provider, MD  pravastatin (PRAVACHOL) 40 MG tablet Take 40 mg by mouth daily.   Yes Historical Provider, MD  sitaGLIPtin (JANUVIA) 50 MG tablet Take 50 mg by mouth daily.   Yes Historical Provider, MD  vitamin C (ASCORBIC ACID) 500 MG tablet Take 500 mg by mouth daily.   Yes Historical Provider, MD  zinc gluconate 50 MG tablet Take 50 mg by mouth daily.   Yes Historical Provider, MD  clopidogrel (PLAVIX) 75 MG tablet Take 1 tablet (75 mg total) by mouth daily. Patient not taking: Reported on 09/30/2016 05/20/11   Barton Dubois, MD  HYDROcodone-acetaminophen [facility]) 5-325 MG tablet Take 0.5-1 tablets by mouth every 6 (six) hours as needed for moderate pain or severe pain. Patient not taking: Reported on 09/30/2016 12/05/15   Shawn Montgomery Rayburn, PA-C  nitroGLYCERIN (NITROSTAT) 0.4 MG SL tablet Place 0.4 mg under the tongue every 5 (five) minutes as needed. For chest pain      Historical Provider, MD    Family History No family  history on file.  Social History Social History  Substance Use Topics  . Smoking status: Former Research scientist (life sciences)  . Smokeless tobacco: Never Used  . Alcohol use 0.6 oz/week    1 Cans of beer per week     Allergies   Patient has no known allergies.   Review of Systems Review of Systems 10 Systems reviewed and are negative for acute change except as noted in the HPI.   Physical Exam Updated Vital Signs BP (!) 159/73 (BP Location: Right Arm)   Pulse 77   Temp 97.7 F (36.5 C) (Oral)  SpO2 100%   Physical Exam  Constitutional: He is oriented to person, place, and time. He appears well-developed and well-nourished.  Patient is alert and interactive. No respiratory distress.  HENT:  Head: Normocephalic and atraumatic.  Nose: Nose normal.  Mouth/Throat: Oropharynx is clear and moist.  Eyes: Conjunctivae are normal. Pupils are equal, round, and reactive to light.  Ectropion right lower eyelid. Chronic condition.  Neck: Neck supple.  Cardiovascular: Normal rate, regular rhythm, normal heart sounds and intact distal pulses.   No murmur heard. Pulmonary/Chest: Effort normal and breath sounds normal. No respiratory distress.  Abdominal: Soft. He exhibits no distension. There is no tenderness. There is no guarding.  Musculoskeletal: Normal range of motion. He exhibits no edema or tenderness.  Neurological: He is alert and oriented to person, place, and time. No cranial nerve deficit. He exhibits normal muscle tone. Coordination normal.  Skin: Skin is warm and dry.  Psychiatric: He has a normal mood and affect.  Nursing note and vitals reviewed.    ED Treatments / Results  Labs (all labs ordered are listed, but only abnormal results are displayed) Labs Reviewed  CBG MONITORING, ED - Abnormal; Notable for the following:       Result Value   Glucose-Capillary 147 (*)    All other components within normal limits    EKG  EKG Interpretation None       Radiology No results  found.  Procedures Procedures (including critical care time)  Medications Ordered in ED Medications - No data to display   Initial Impression / Assessment and Plan / ED Course  I have reviewed the triage vital signs and the nursing notes.  Pertinent labs & imaging results that were available during my care of the patient were reviewed by me and considered in my medical decision making (see chart for details).     Final Clinical Impressions(s) / ED Diagnoses   Final diagnoses:  Somnolence  At this time patient is at baseline. Review of systems is negative. Plan will be for continued observation of any signs of illness or change in mental status or vegatative function.  New Prescriptions New Prescriptions   No medications on file     Charlesetta Shanks, MD 09/30/16 1424

## 2016-09-30 NOTE — ED Notes (Signed)
Bed: WA03 Expected date:  Expected time:  Means of arrival:  Comments: EMS-unresponsive

## 2017-01-29 ENCOUNTER — Inpatient Hospital Stay (HOSPITAL_COMMUNITY)
Admission: EM | Admit: 2017-01-29 | Discharge: 2017-02-09 | DRG: 177 | Disposition: A | Discharge status: Skilled Nursing Facility | Payer: Medicare Other | Attending: Internal Medicine | Admitting: Internal Medicine

## 2017-01-29 ENCOUNTER — Emergency Department (HOSPITAL_COMMUNITY): Payer: Medicare Other

## 2017-01-29 ENCOUNTER — Encounter (HOSPITAL_COMMUNITY): Payer: Self-pay

## 2017-01-29 DIAGNOSIS — R0902 Hypoxemia: Secondary | ICD-10-CM | POA: Diagnosis not present

## 2017-01-29 DIAGNOSIS — G309 Alzheimer's disease, unspecified: Secondary | ICD-10-CM | POA: Diagnosis present

## 2017-01-29 DIAGNOSIS — Z79899 Other long term (current) drug therapy: Secondary | ICD-10-CM

## 2017-01-29 DIAGNOSIS — J439 Emphysema, unspecified: Secondary | ICD-10-CM | POA: Diagnosis present

## 2017-01-29 DIAGNOSIS — Z993 Dependence on wheelchair: Secondary | ICD-10-CM

## 2017-01-29 DIAGNOSIS — I10 Essential (primary) hypertension: Secondary | ICD-10-CM | POA: Diagnosis present

## 2017-01-29 DIAGNOSIS — J90 Pleural effusion, not elsewhere classified: Secondary | ICD-10-CM | POA: Diagnosis not present

## 2017-01-29 DIAGNOSIS — L8990 Pressure ulcer of unspecified site, unspecified stage: Secondary | ICD-10-CM | POA: Diagnosis present

## 2017-01-29 DIAGNOSIS — I130 Hypertensive heart and chronic kidney disease with heart failure and stage 1 through stage 4 chronic kidney disease, or unspecified chronic kidney disease: Secondary | ICD-10-CM | POA: Diagnosis present

## 2017-01-29 DIAGNOSIS — E119 Type 2 diabetes mellitus without complications: Secondary | ICD-10-CM

## 2017-01-29 DIAGNOSIS — Z7189 Other specified counseling: Secondary | ICD-10-CM | POA: Diagnosis not present

## 2017-01-29 DIAGNOSIS — F0390 Unspecified dementia without behavioral disturbance: Secondary | ICD-10-CM | POA: Diagnosis present

## 2017-01-29 DIAGNOSIS — E871 Hypo-osmolality and hyponatremia: Secondary | ICD-10-CM | POA: Diagnosis present

## 2017-01-29 DIAGNOSIS — J690 Pneumonitis due to inhalation of food and vomit: Principal | ICD-10-CM | POA: Diagnosis present

## 2017-01-29 DIAGNOSIS — N179 Acute kidney failure, unspecified: Secondary | ICD-10-CM | POA: Diagnosis present

## 2017-01-29 DIAGNOSIS — J189 Pneumonia, unspecified organism: Secondary | ICD-10-CM | POA: Diagnosis present

## 2017-01-29 DIAGNOSIS — E870 Hyperosmolality and hypernatremia: Secondary | ICD-10-CM | POA: Diagnosis present

## 2017-01-29 DIAGNOSIS — G20 Parkinson's disease: Secondary | ICD-10-CM | POA: Diagnosis present

## 2017-01-29 DIAGNOSIS — I2510 Atherosclerotic heart disease of native coronary artery without angina pectoris: Secondary | ICD-10-CM | POA: Diagnosis present

## 2017-01-29 DIAGNOSIS — K219 Gastro-esophageal reflux disease without esophagitis: Secondary | ICD-10-CM | POA: Diagnosis present

## 2017-01-29 DIAGNOSIS — R1310 Dysphagia, unspecified: Secondary | ICD-10-CM

## 2017-01-29 DIAGNOSIS — Z955 Presence of coronary angioplasty implant and graft: Secondary | ICD-10-CM

## 2017-01-29 DIAGNOSIS — Z7982 Long term (current) use of aspirin: Secondary | ICD-10-CM

## 2017-01-29 DIAGNOSIS — I700 Atherosclerosis of aorta: Secondary | ICD-10-CM | POA: Diagnosis present

## 2017-01-29 DIAGNOSIS — E1122 Type 2 diabetes mellitus with diabetic chronic kidney disease: Secondary | ICD-10-CM | POA: Diagnosis present

## 2017-01-29 DIAGNOSIS — E860 Dehydration: Secondary | ICD-10-CM | POA: Diagnosis present

## 2017-01-29 DIAGNOSIS — H9190 Unspecified hearing loss, unspecified ear: Secondary | ICD-10-CM | POA: Diagnosis present

## 2017-01-29 DIAGNOSIS — E7800 Pure hypercholesterolemia, unspecified: Secondary | ICD-10-CM | POA: Diagnosis present

## 2017-01-29 DIAGNOSIS — Z66 Do not resuscitate: Secondary | ICD-10-CM | POA: Diagnosis present

## 2017-01-29 DIAGNOSIS — N183 Chronic kidney disease, stage 3 (moderate): Secondary | ICD-10-CM | POA: Diagnosis present

## 2017-01-29 DIAGNOSIS — Z974 Presence of external hearing-aid: Secondary | ICD-10-CM

## 2017-01-29 DIAGNOSIS — E43 Unspecified severe protein-calorie malnutrition: Secondary | ICD-10-CM | POA: Diagnosis present

## 2017-01-29 DIAGNOSIS — Z9889 Other specified postprocedural states: Secondary | ICD-10-CM

## 2017-01-29 DIAGNOSIS — Z794 Long term (current) use of insulin: Secondary | ICD-10-CM

## 2017-01-29 DIAGNOSIS — Z7401 Bed confinement status: Secondary | ICD-10-CM

## 2017-01-29 DIAGNOSIS — Z7902 Long term (current) use of antithrombotics/antiplatelets: Secondary | ICD-10-CM

## 2017-01-29 DIAGNOSIS — F0280 Dementia in other diseases classified elsewhere without behavioral disturbance: Secondary | ICD-10-CM | POA: Diagnosis present

## 2017-01-29 DIAGNOSIS — Z87891 Personal history of nicotine dependence: Secondary | ICD-10-CM

## 2017-01-29 DIAGNOSIS — G9340 Encephalopathy, unspecified: Secondary | ICD-10-CM | POA: Diagnosis not present

## 2017-01-29 DIAGNOSIS — Z515 Encounter for palliative care: Secondary | ICD-10-CM | POA: Diagnosis not present

## 2017-01-29 DIAGNOSIS — Z85828 Personal history of other malignant neoplasm of skin: Secondary | ICD-10-CM

## 2017-01-29 DIAGNOSIS — Z951 Presence of aortocoronary bypass graft: Secondary | ICD-10-CM

## 2017-01-29 DIAGNOSIS — Z6822 Body mass index (BMI) 22.0-22.9, adult: Secondary | ICD-10-CM

## 2017-01-29 DIAGNOSIS — M1990 Unspecified osteoarthritis, unspecified site: Secondary | ICD-10-CM | POA: Diagnosis present

## 2017-01-29 DIAGNOSIS — I252 Old myocardial infarction: Secondary | ICD-10-CM

## 2017-01-29 DIAGNOSIS — Y95 Nosocomial condition: Secondary | ICD-10-CM | POA: Diagnosis present

## 2017-01-29 LAB — CBC WITH DIFFERENTIAL/PLATELET
Basophils Absolute: 0 10*3/uL (ref 0.0–0.1)
Basophils Relative: 0 %
EOS PCT: 1 %
Eosinophils Absolute: 0.1 10*3/uL (ref 0.0–0.7)
HEMATOCRIT: 34.7 % — AB (ref 39.0–52.0)
HEMOGLOBIN: 11 g/dL — AB (ref 13.0–17.0)
LYMPHS ABS: 2.3 10*3/uL (ref 0.7–4.0)
LYMPHS PCT: 18 %
MCH: 31.1 pg (ref 26.0–34.0)
MCHC: 31.7 g/dL (ref 30.0–36.0)
MCV: 98 fL (ref 78.0–100.0)
Monocytes Absolute: 1 10*3/uL (ref 0.1–1.0)
Monocytes Relative: 8 %
NEUTROS ABS: 9.2 10*3/uL — AB (ref 1.7–7.7)
Neutrophils Relative %: 73 %
PLATELETS: 232 10*3/uL (ref 150–400)
RBC: 3.54 MIL/uL — AB (ref 4.22–5.81)
RDW: 15.7 % — ABNORMAL HIGH (ref 11.5–15.5)
WBC: 12.7 10*3/uL — AB (ref 4.0–10.5)

## 2017-01-29 LAB — BASIC METABOLIC PANEL
ANION GAP: 9 (ref 5–15)
BUN: 33 mg/dL — ABNORMAL HIGH (ref 6–20)
CHLORIDE: 110 mmol/L (ref 101–111)
CO2: 29 mmol/L (ref 22–32)
Calcium: 8.6 mg/dL — ABNORMAL LOW (ref 8.9–10.3)
Creatinine, Ser: 2.2 mg/dL — ABNORMAL HIGH (ref 0.61–1.24)
GFR calc non Af Amer: 24 mL/min — ABNORMAL LOW (ref >60)
GFR, EST AFRICAN AMERICAN: 28 mL/min — AB (ref >60)
Glucose, Bld: 59 mg/dL — ABNORMAL LOW (ref 65–99)
POTASSIUM: 4.3 mmol/L (ref 3.5–5.1)
SODIUM: 148 mmol/L — AB (ref 135–145)

## 2017-01-29 LAB — I-STAT CHEM 8, ED
BUN: 48 mg/dL — AB (ref 6–20)
CALCIUM ION: 1.07 mmol/L — AB (ref 1.15–1.40)
CHLORIDE: 106 mmol/L (ref 101–111)
CREATININE: 2 mg/dL — AB (ref 0.61–1.24)
GLUCOSE: 45 mg/dL — AB (ref 65–99)
HCT: 33 % — ABNORMAL LOW (ref 39.0–52.0)
Hemoglobin: 11.2 g/dL — ABNORMAL LOW (ref 13.0–17.0)
POTASSIUM: 5.4 mmol/L — AB (ref 3.5–5.1)
Sodium: 146 mmol/L — ABNORMAL HIGH (ref 135–145)
TCO2: 32 mmol/L (ref 0–100)

## 2017-01-29 LAB — I-STAT TROPONIN, ED: Troponin i, poc: 0.03 ng/mL (ref 0.00–0.08)

## 2017-01-29 LAB — GLUCOSE, CAPILLARY: GLUCOSE-CAPILLARY: 88 mg/dL (ref 65–99)

## 2017-01-29 LAB — I-STAT CG4 LACTIC ACID, ED: LACTIC ACID, VENOUS: 1.43 mmol/L (ref 0.5–1.9)

## 2017-01-29 LAB — MRSA PCR SCREENING: MRSA BY PCR: POSITIVE — AB

## 2017-01-29 LAB — BRAIN NATRIURETIC PEPTIDE: B Natriuretic Peptide: 246.4 pg/mL — ABNORMAL HIGH (ref 0.0–100.0)

## 2017-01-29 MED ORDER — ACETAMINOPHEN 325 MG PO TABS: 650.0000 mg | ORAL_TABLET | Freq: Three times a day (TID) | ORAL | Status: DC | PRN

## 2017-01-29 MED ORDER — MUPIROCIN 2 % EX OINT
1.0000 "application " | TOPICAL_OINTMENT | Freq: Two times a day (BID) | CUTANEOUS | Status: AC
  Administered 2017-01-29 – 2017-02-03 (×10): 1 via NASAL
  Filled 2017-01-29 (×2): qty 22

## 2017-01-29 MED ORDER — CHLORHEXIDINE GLUCONATE CLOTH 2 % EX PADS
6.0000 | MEDICATED_PAD | Freq: Every day | CUTANEOUS | Status: AC
  Administered 2017-01-30 – 2017-02-03 (×5): 6 via TOPICAL

## 2017-01-29 MED ORDER — DEXTROSE-NACL 5-0.45 % IV SOLN
INTRAVENOUS | Status: DC
  Administered 2017-01-29: 21:00:00 via INTRAVENOUS

## 2017-01-29 MED ORDER — POLYVINYL ALCOHOL 1.4 % OP SOLN
1.0000 [drp] | Freq: Three times a day (TID) | OPHTHALMIC | Status: DC
  Administered 2017-01-30 – 2017-02-09 (×30): 1 [drp] via OPHTHALMIC
  Filled 2017-01-29: qty 15

## 2017-01-29 MED ORDER — CLOPIDOGREL BISULFATE 75 MG PO TABS
75.0000 mg | ORAL_TABLET | Freq: Every day | ORAL | Status: DC
  Administered 2017-02-02: 75 mg via ORAL
  Filled 2017-01-29: qty 1

## 2017-01-29 MED ORDER — DEXTROSE-NACL 5-0.45 % IV SOLN: INTRAVENOUS | Status: DC

## 2017-01-29 MED ORDER — HEPARIN SODIUM (PORCINE) 5000 UNIT/ML IJ SOLN
5000.0000 [IU] | Freq: Three times a day (TID) | INTRAMUSCULAR | Status: DC
  Administered 2017-01-29 – 2017-02-01 (×8): 5000 [IU] via SUBCUTANEOUS
  Filled 2017-01-29 (×7): qty 1

## 2017-01-29 MED ORDER — FAMOTIDINE 20 MG PO TABS
40.0000 mg | ORAL_TABLET | Freq: Every day | ORAL | Status: DC
  Administered 2017-01-29: 40 mg via ORAL

## 2017-01-29 MED ORDER — PANTOPRAZOLE SODIUM 40 MG PO TBEC
40.0000 mg | DELAYED_RELEASE_TABLET | Freq: Two times a day (BID) | ORAL | Status: DC
  Administered 2017-01-29 – 2017-02-09 (×11): 40 mg via ORAL
  Filled 2017-01-29 (×13): qty 1

## 2017-01-29 MED ORDER — ONDANSETRON HCL 4 MG PO TABS
4.0000 mg | ORAL_TABLET | Freq: Four times a day (QID) | ORAL | Status: DC | PRN
  Filled 2017-01-29: qty 1

## 2017-01-29 MED ORDER — FLUDROCORTISONE ACETATE 0.1 MG PO TABS
0.1000 mg | ORAL_TABLET | Freq: Every day | ORAL | Status: DC
Start: 2017-01-30 — End: 2017-02-09
  Administered 2017-02-02 – 2017-02-09 (×6): 0.1 mg via ORAL
  Filled 2017-01-29 (×11): qty 1

## 2017-01-29 MED ORDER — ONDANSETRON HCL 4 MG/2ML IJ SOLN: 4.0000 mg | Freq: Four times a day (QID) | INTRAMUSCULAR | Status: DC | PRN

## 2017-01-29 MED ORDER — VANCOMYCIN HCL 10 G IV SOLR
1500.0000 mg | Freq: Once | INTRAVENOUS | Status: AC
  Administered 2017-01-29: 1500 mg via INTRAVENOUS
  Filled 2017-01-29: qty 1500

## 2017-01-29 MED ORDER — INSULIN GLARGINE 100 UNIT/ML ~~LOC~~ SOLN
12.0000 [IU] | Freq: Every day | SUBCUTANEOUS | Status: DC
  Administered 2017-01-29 – 2017-02-04 (×7): 12 [IU] via SUBCUTANEOUS
  Filled 2017-01-29 (×7): qty 0.12

## 2017-01-29 MED ORDER — METOPROLOL SUCCINATE ER 25 MG PO TB24
25.0000 mg | ORAL_TABLET | Freq: Every day | ORAL | Status: DC
  Administered 2017-02-02 – 2017-02-03 (×2): 25 mg via ORAL
  Filled 2017-01-29 (×2): qty 1

## 2017-01-29 MED ORDER — DEXTROSE-NACL 5-0.45 % IV SOLN
INTRAVENOUS | Status: DC
  Administered 2017-01-29: 100 mL/h via INTRAVENOUS

## 2017-01-29 MED ORDER — HYPROMELLOSE (GONIOSCOPIC) 2.5 % OP SOLN: 1.0000 [drp] | Freq: Three times a day (TID) | OPHTHALMIC | Status: DC

## 2017-01-29 MED ORDER — PIPERACILLIN-TAZOBACTAM IN DEX 2-0.25 GM/50ML IV SOLN
2.2500 g | Freq: Four times a day (QID) | INTRAVENOUS | Status: DC
  Administered 2017-01-30 – 2017-02-05 (×26): 2.25 g via INTRAVENOUS
  Filled 2017-01-29 (×27): qty 50

## 2017-01-29 MED ORDER — PRAVASTATIN SODIUM 40 MG PO TABS
40.0000 mg | ORAL_TABLET | Freq: Every day | ORAL | Status: DC
  Administered 2017-02-02 – 2017-02-03 (×2): 40 mg via ORAL
  Filled 2017-01-29 (×2): qty 1

## 2017-01-29 MED ORDER — ASPIRIN EC 81 MG PO TBEC
81.0000 mg | DELAYED_RELEASE_TABLET | Freq: Two times a day (BID) | ORAL | Status: DC
  Administered 2017-01-29 – 2017-02-02 (×2): 81 mg via ORAL
  Filled 2017-01-29 (×3): qty 1

## 2017-01-29 MED ORDER — PIPERACILLIN-TAZOBACTAM 3.375 G IVPB 30 MIN
3.3750 g | Freq: Once | INTRAVENOUS | Status: AC
  Administered 2017-01-29: 3.375 g via INTRAVENOUS
  Filled 2017-01-29: qty 50

## 2017-01-29 MED ORDER — NYSTATIN 100000 UNIT/ML MT SUSP
5.0000 mL | Freq: Three times a day (TID) | OROMUCOSAL | Status: DC
  Administered 2017-01-29 – 2017-02-09 (×24): 500000 [IU] via ORAL
  Filled 2017-01-29 (×20): qty 5

## 2017-01-29 MED ORDER — DONEPEZIL HCL 5 MG PO TABS
5.0000 mg | ORAL_TABLET | Freq: Every day | ORAL | Status: DC
  Administered 2017-01-29 – 2017-02-08 (×5): 5 mg via ORAL
  Filled 2017-01-29 (×7): qty 1

## 2017-01-29 MED ORDER — MEMANTINE HCL ER 28 MG PO CP24
28.0000 mg | ORAL_CAPSULE | Freq: Every day | ORAL | Status: DC
Start: 2017-01-30 — End: 2017-02-09
  Administered 2017-02-02 – 2017-02-09 (×6): 28 mg via ORAL
  Filled 2017-01-29 (×11): qty 1

## 2017-01-29 MED ORDER — DULOXETINE HCL 20 MG PO CPEP
20.0000 mg | ORAL_CAPSULE | Freq: Every day | ORAL | Status: DC
Start: 2017-01-30 — End: 2017-02-09
  Administered 2017-02-02 – 2017-02-09 (×6): 20 mg via ORAL
  Filled 2017-01-29 (×11): qty 1

## 2017-01-29 MED ORDER — UMECLIDINIUM-VILANTEROL 62.5-25 MCG/INH IN AEPB
1.0000 | INHALATION_SPRAY | Freq: Every day | RESPIRATORY_TRACT | Status: DC
  Filled 2017-01-29: qty 14

## 2017-01-29 NOTE — Progress Notes (Signed)
CRITICAL VALUE ALERT  Critical Value:  MRSA +  Date & Time Notied:  2300   Provider Notified: Opyd  Orders Received/Actions taken: MRSA protocol initiated.

## 2017-01-29 NOTE — ED Notes (Signed)
Bed: YQ65 Expected date:  Expected time:  Means of arrival:  Comments: EMS SOB pneumonia

## 2017-01-29 NOTE — ED Notes (Signed)
Please call report to Caryl Pina [phone]) at [phone]. Andre Lefort

## 2017-01-29 NOTE — Progress Notes (Signed)
MRSA PCR collected and sent to lab

## 2017-01-29 NOTE — ED Provider Notes (Signed)
[facility] DEPT Provider Note   CSN: [id number] Arrival date & time: 01/29/17  1236     History   Chief Complaint Chief Complaint  Patient presents with  . Shortness of Breath    currently taking Amoxicillin for pneumonia    HPI Leon Sharp is a 83 y.o. male.  83 yo M with a chief complaint of cough and shortness of breath. This been going on for the past couple days. He was started on amoxicillin for pneumonia couple days ago. His symptoms have gotten worse and he was sent to the emergency department. Level 5 caveat dementia.   The history is provided by the patient.  Shortness of Breath  This is a new problem. The average episode lasts 2 days. The problem occurs continuously.The current episode started 2 days ago. The problem has been gradually worsening. Associated symptoms include cough and chest pain. Pertinent negatives include no fever, no headaches, no vomiting, no abdominal pain and no rash. He has tried nothing for the symptoms. The treatment provided no relief. Associated medical issues include pneumonia.    Past Medical History:  Diagnosis Date  . Angina   . Arthritis   . Blood transfusion   . Cancer ([place])   . Chronic kidney disease   . Clotting disorder ([place])   . COPD (chronic obstructive pulmonary disease) ([place])   . Coronary artery disease    CABG 1972, redo 1997  . Dementia   . Diabetes mellitus    IDDM  . GERD (gastroesophageal reflux disease)   . High cholesterol   . HOH (hard of hearing)    wears hearing aids sometimes  . Hypertension   . Myocardial infarction [facility])    multiple stents since CABG  . Parkinson's disease ([place])   . Skin cancer     Patient Active Problem List   Diagnosis Date Noted  . Leg wound, right 12/05/2015  . Fall 05/17/2011  . Subdural hematoma ([place]) 05/15/2011  . Diabetes mellitus 05/15/2011  . Hypertension 05/15/2011  . Syncope 05/15/2011    Past Surgical History:  Procedure Laterality Date  . APPLICATION  OF WOUND VAC Right 10/17/2015   Procedure: A CELL AND WOUND VAC PLACEMENT RIGHT LEG WOUND;  Surgeon: Wallace Going, DO;  Location: [facility];  Service: Plastics;  Laterality: Right;  . BACK SURGERY    . CARDIAC SURGERY    . CHOLECYSTECTOMY    . COLON SURGERY    . CORONARY ARTERY BYPASS GRAFT  1972, redo 1997  . MINOR APPLICATION OF WOUND VAC Right 11/01/2015   Procedure: APPLICATION OF A-CELL OF leg and wound vac application;  Surgeon: Wallace Going, DO;  Location: [facility];  Service: Plastics;  Laterality: Right;  . MINOR IRRIGATION AND DEBRIDEMENT OF WOUND Right 11/01/2015   Procedure: IRRIGATION AND DEBRIDEMENT leg;  Surgeon: Wallace Going, DO;  Location: [facility];  Service: Plastics;  Laterality: Right;  . SKIN GRAFT Right 12/05/2015   Procedure: SPLIT THICKNESS SKIN GRAFT RIGHT LOWER LEG   (MINOR PROCEDURE) ;  Surgeon: Wallace Going, DO;  Location: [facility];  Service: Plastics;  Laterality: Right;       Home Medications    Prior to Admission medications   Medication Sig Start Date End Date Taking? Authorizing Provider  acetaminophen (TYLENOL) 500 MG tablet Take 1,000 mg by mouth every 8 (eight) hours as needed (for pain).   Yes [provider]  amoxicillin-clavulanate (AUGMENTIN) [phone]  MG tablet Take 1 tablet by mouth 2 (two) times daily. Started 08/03 for 10 days   Yes [provider]  aspirin 81 MG tablet Take 81 mg by mouth 2 (two) times daily.   Yes [provider]  celecoxib (CELEBREX) 100 MG capsule Take 100 mg by mouth daily.   Yes [provider]  clopidogrel (PLAVIX) 75 MG tablet Take 1 tablet (75 mg total) by mouth daily. Patient taking differently: Take 75 mg by mouth daily with breakfast.  05/20/11  Yes Barton Dubois, MD  cyanocobalamin 500 MCG tablet Take 500 mcg by mouth daily with breakfast.    Yes [provider]  donepezil (ARICEPT)  5 MG tablet Take 5 mg by mouth at bedtime.   Yes [provider]  DULoxetine (CYMBALTA) 20 MG capsule Take 20 mg by mouth daily with breakfast.    Yes [provider]  famotidine (PEPCID) 40 MG tablet Take 40 mg by mouth at bedtime.   Yes [provider]  fludrocortisone (FLORINEF) 0.1 MG tablet Take 0.1 mg by mouth daily with breakfast.    Yes [provider]  hydroxypropyl methylcellulose / hypromellose (ISOPTO TEARS / GONIOVISC) 2.5 % ophthalmic solution Place 1 drop into both eyes 3 (three) times daily with meals.   Yes [provider]  insulin glargine (LANTUS) 100 UNIT/ML injection Inject 28 Units into the skin at bedtime.    Yes [provider]  insulin lispro (HUMALOG) 100 UNIT/ML injection Inject 8-15 Units into the skin 3 (three) times daily with meals. Sliding scale. 101-150 8 units. 151-200=10 units, 201-250=12 units, 251-300=13 units, 301-350=14 units, If >350 give 15 units and Notify MD.   Yes [provider]  iron polysaccharides (NU-IRON) 150 MG capsule Take 150 mg by mouth daily with breakfast.    Yes [provider]  memantine (NAMENDA XR) 28 MG CP24 24 hr capsule Take 28 mg by mouth daily with breakfast.    Yes [provider]  metoprolol succinate (TOPROL-XL) 25 MG 24 hr tablet Take 25 mg by mouth daily with breakfast.    Yes [provider]  nitroGLYCERIN (NITROSTAT) 0.4 MG SL tablet Place 0.4 mg under the tongue every 5 (five) minutes as needed for chest pain.    Yes [provider]  nystatin (MYCOSTATIN) 100000 UNIT/ML suspension Take 5 mLs by mouth 4 (four) times daily -  with meals and at bedtime.    Yes [provider]  Omega-3 1000 MG CAPS Take 1 capsule by mouth daily.   Yes [provider]  ondansetron (ZOFRAN) 4 MG tablet Take 4 mg by mouth every 6 (six) hours as needed for nausea or vomiting.   Yes [provider]  pantoprazole (PROTONIX) 40 MG  tablet Take 40 mg by mouth 2 (two) times daily.    Yes [provider]  pravastatin (PRAVACHOL) 40 MG tablet Take 40 mg by mouth daily with breakfast.    Yes [provider]  sitaGLIPtin (JANUVIA) 50 MG tablet Take 50 mg by mouth daily with breakfast.    Yes [provider]  umeclidinium-vilanterol (ANORO ELLIPTA) 62.5-25 MCG/INH AEPB Inhale 1 puff into the lungs daily.   Yes [provider]  vitamin C (ASCORBIC ACID) 500 MG tablet Take 500 mg by mouth daily with breakfast.    Yes [provider]  zinc gluconate 50 MG tablet Take 50 mg by mouth daily with breakfast.    Yes [provider]    Family History  Family History  Problem Relation Age of Onset  . Family history unknown: Yes    Social History Social History  Substance Use Topics  . Smoking status: Former Research scientist (life sciences)  . Smokeless tobacco: Never Used  . Alcohol use No     Allergies   Patient has no known allergies.   Review of Systems Review of Systems  Constitutional: Negative for chills and fever.  HENT: Negative for congestion and facial swelling.   Eyes: Negative for discharge and visual disturbance.  Respiratory: Positive for cough and shortness of breath.   Cardiovascular: Positive for chest pain. Negative for palpitations.  Gastrointestinal: Negative for abdominal pain, diarrhea and vomiting.  Musculoskeletal: Negative for arthralgias and myalgias.  Skin: Negative for color change and rash.  Neurological: Negative for tremors, syncope and headaches.  Psychiatric/Behavioral: Negative for confusion and dysphoric mood.     Physical Exam Updated Vital Signs BP 112/66   Pulse 83   Temp (!) 97.5 F (36.4 C) (Oral)   Resp 19   Ht 5\' 10"  (1.778 m)   Wt 72.6 kg (160 lb)   SpO2 95%   BMI 22.96 kg/m   Physical Exam  Constitutional: He is oriented to person, place, and time.  Chronically ill-appearing  HENT:  Head: Normocephalic and atraumatic.  Eyes: Pupils  are equal, round, and reactive to light. EOM are normal.  Neck: Normal range of motion. Neck supple. No JVD present.  Cardiovascular: Normal rate and regular rhythm.  Exam reveals no gallop and no friction rub.   No murmur heard. Pulmonary/Chest: No respiratory distress. He has no wheezes.  Diminished breath sounds on the left side up to the spine of the scapula.  Abdominal: He exhibits no distension and no mass. There is no tenderness. There is no rebound and no guarding.  Musculoskeletal: Normal range of motion.  Neurological: He is alert and oriented to person, place, and time.  Skin: No rash noted. No pallor.  Psychiatric: He has a normal mood and affect. His behavior is normal.  Nursing note and vitals reviewed.    ED Treatments / Results  Labs (all labs ordered are listed, but only abnormal results are displayed) Labs Reviewed  CBC WITH DIFFERENTIAL/PLATELET - Abnormal; Notable for the following:       Result Value   WBC 12.7 (*)    RBC 3.54 (*)    Hemoglobin 11.0 (*)    HCT 34.7 (*)    RDW 15.7 (*)    Neutro Abs 9.2 (*)    All other components within normal limits  BRAIN NATRIURETIC PEPTIDE - Abnormal; Notable for the following:    B Natriuretic Peptide 246.4 (*)    All other components within normal limits  BASIC METABOLIC PANEL - Abnormal; Notable for the following:    Sodium 148 (*)    Glucose, Bld 59 (*)    BUN 33 (*)    Creatinine, Ser 2.20 (*)    Calcium 8.6 (*)    GFR calc non Af Amer 24 (*)    GFR calc Af Amer 28 (*)    All other components within normal limits  I-STAT CHEM 8, ED - Abnormal; Notable for the following:    Sodium 146 (*)    Potassium 5.4 (*)    BUN 48 (*)    Creatinine, Ser 2.00 (*)    Glucose, Bld 45 (*)    Calcium, Ion 1.07 (*)    Hemoglobin 11.2 (*)    HCT 33.0 (*)  All other components within normal limits  CULTURE, BLOOD (ROUTINE X 2)  CULTURE, BLOOD (ROUTINE X 2)  URINALYSIS, ROUTINE W REFLEX MICROSCOPIC  I-STAT TROPONIN, ED    I-STAT CG4 LACTIC ACID, ED    EKG  EKG Interpretation None       Radiology Dg Chest 2 View  Result Date: 01/29/2017 CLINICAL DATA:  Shortness of breath for 1 day. EXAM: CHEST  2 VIEW COMPARISON:  Chest x-ray dated June 30, 2016. FINDINGS: Postsurgical changes related to prior CABG. The cardiomediastinal silhouette is normal in size. Atherosclerotic calcification of the aortic arch. Low lung volumes are present, causing crowding of the pulmonary vasculature. Large left pleural effusion with left lower lobe consolidation. The right lung is clear. No pneumothorax. No acute osseous abnormality. Old right acromioclavicular separation. IMPRESSION: Large left pleural effusion with left lower lobe consolidation, which could represent pneumonia or atelectasis. Consider further evaluation with chest CT as clinically indicated. Electronically Signed   By: Titus Dubin M.D.   On: 01/29/2017 14:39    Procedures Procedures (including critical care time)  Medications Ordered in ED Medications  vancomycin (VANCOCIN) 1,500 mg in sodium chloride 0.9 % 500 mL IVPB (not administered)  piperacillin-tazobactam (ZOSYN) IVPB 3.375 g (not administered)  dextrose 5 %-0.45 % sodium chloride infusion (not administered)     Initial Impression / Assessment and Plan / ED Course  I have reviewed the triage vital signs and the nursing notes.  Pertinent labs & imaging results that were available during my care of the patient were reviewed by me and considered in my medical decision making (see chart for details).     83 yo M with a chief complaint of cough and shortness of breath. Will obtain a chest x-ray labs UA.  Chest x-ray with a large pleural effusion. With the patient's cough and shortness of breath suspect that this is pneumonia. Started on broad-spectrum antibiotics as he has recently been started on antibiotics for thought to be aspiration pneumonia. Discussed with the hospitalist. Admit.  The  patients results and plan were reviewed and discussed.   Any x-rays performed were independently reviewed by myself.   Differential diagnosis were considered with the presenting HPI.  Medications  vancomycin (VANCOCIN) 1,500 mg in sodium chloride 0.9 % 500 mL IVPB (not administered)  piperacillin-tazobactam (ZOSYN) IVPB 3.375 g (not administered)  dextrose 5 %-0.45 % sodium chloride infusion (not administered)    Vitals:   01/29/17 1330 01/29/17 1400 01/29/17 1430 01/29/17 1500  BP: 131/71 132/73 134/69 112/66  Pulse: 77 81 78 83  Resp: (!) 25 (!) 39 (!) 41 19  Temp:      TempSrc:      SpO2: 93% 92% 94% 95%  Weight:      Height:        Final diagnoses:  HCAP (healthcare-associated pneumonia)    Admission/ observation were discussed with the admitting physician, patient and/or family and they are comfortable with the plan.    Final Clinical Impressions(s) / ED Diagnoses   Final diagnoses:  HCAP (healthcare-associated pneumonia)    New Prescriptions New Prescriptions   No medications on file     Deno Etienne, DO 01/29/17 1604

## 2017-01-29 NOTE — Clinical Social Work Note (Signed)
Clinical Social Work Assessment  Patient Details  Name: Leon Sharp MRN: [id number] Date of Birth: 21-May-1922  Date of referral:  01/29/17               Reason for consult:  Facility Placement                Permission sought to share information with:    Permission granted to share information::  Yes, Verbal Permission Granted  Name::        Agency::     Relationship::     Contact Information:     Housing/Transportation Living arrangements for the past 2 months:  [place] of Information:  Adult Children Patient Interpreter Needed:  None Criminal Activity/Legal Involvement Pertinent to Current Situation/Hospitalization:    Significant Relationships:  Adult Children Lives with:  Facility Resident Do you feel safe going back to the place where you live?  Yes Need for family participation in patient care:  Yes (Comment)  Care giving concerns:  None listed by pt/family   Social Worker assessment / plan:  CSW met with pt's family, pt was asleep, and confirmed pt's family's plan for pt to be discharged to SNF to live at discharge if PT recommends.  CSW provided active listening and validated pt's family's concerns that they prefer a SNF in either [place] near [place], [place], or [place].   CSW Dept was given permission to complete FL-2 and send referrals out to SNF facilities.  Pt has been living at [facility] (NOT in memory care) since November, prior to being admitted to [facility].  Employment status:  Retired Health visitor, Managed Care PT Recommendations:  Not assessed at this time Information / Referral to community resources:     Patient/Family's Response to care:  Patientnot alert and oriented.  Patient's daughter Leon Sharp at ph: [phone] and pt's SIL Leon Sharp at ph: [phone] agreeable to plan.  Pt's daughter and SIL supportive and strongly involved in pt.'s care and  daughter want sto be consulted on all D/C and medical decisions.  Pt.'s daughter and SIL pleasant and appreciated CSW intervention.   Patient/Family's Understanding of and Emotional Response to Diagnosis, Current Treatment, and Prognosis:  Still assessing  Emotional Assessment Appearance:  Appears stated age Attitude/Demeanor/Rapport:    Affect (typically observed):  Unable to Assess Orientation:   (Pt was asleep and has dementia) Alcohol / Substance use:    Psych involvement (Current and /or in the community):     Discharge Needs  Concerns to be addressed:  No discharge needs identified Readmission within the last 30 days:  No Current discharge risk:  None Barriers to Discharge:  No Barriers Identified   Claudine Mouton, LCSWA 01/29/2017, 5:15 PM

## 2017-01-29 NOTE — Progress Notes (Signed)
Pharmacy Antibiotic Note  Leon Sharp is a 83 y.o. male admitted on 01/29/2017 with pneumonia.  Pharmacy has been consulted for zosyn dosing.  Plan:  Zosyn 2.25gm IV q6h over 60min infusion based on age and borderline CrCl for dose adjustment  Follow renal fx  Follow ability to narrow  Height: 5\' 10"  (177.8 cm) Weight: 160 lb (72.6 kg) IBW/kg (Calculated) : 73  Temp (24hrs), Avg:97.6 F (36.4 C), Min:97.5 F (36.4 C), Max:97.6 F (36.4 C)   Recent Labs Lab 01/29/17 1322 01/29/17 1359 01/29/17 1447 01/29/17 1635  WBC 12.7*  --   --   --   CREATININE  --  2.00* 2.20*  --   LATICACIDVEN  --   --   --  1.43    Estimated Creatinine Clearance: 22 mL/min (A) (by C-G formula based on SCr of 2.2 mg/dL (H)).    No Known Allergies  Antimicrobials this admission: 8/9 zosyn >>  Dose adjustments this admission:   Microbiology results: 8/9 BCx:   Thank you for allowing pharmacy to be a part of this patient's care.  Doreene Eland, PharmD, BCPS.   Pager: [phone] 01/29/2017 7:58 PM

## 2017-01-29 NOTE — ED Triage Notes (Signed)
Per EMS- patient is a resident of [facility]. Patient c/o increased SOB x 1 day. Patient is currently receiving amoxicillin for pneumonia EKG-NSR with PVC's.

## 2017-01-29 NOTE — H&P (Addendum)
History and Physical    Leon Sharp AJO:[id number] DOB: Jan 26, 1922 DOA: 01/29/2017  Referring MD/NP/PA: EDP PCP:  Patient coming from: [facility] assisted living facility  Chief Complaint: Shortness of breath  HPI: Leon Sharp is a 83 year old male with multiple medical problems most notable for dementia, debility/wheelchair-bound, COPD, CAD with numerous stents, severe dysphagia, history of congestive heart failure, chronic kidney disease stage III was diagnosed with aspiration pneumonia last Friday and was started on Augmentin then. He's had extensive workup for dysphagia, his most recent modified barium swallow was 2 weeks ago, where it was recommended to place a PEG tube, which was declined by his daughter and patient continued on a pured diet with honey thick liquids. He also had an endoscopy a week ago, he was noted to have a mild esophageal stricture which was dilated then. He was brought to the emergency room today due to increased shortness of breath. Patient is unable to provide much history he just tells me that he is too tired to talk [facility] ED Course: Workup in the ED notable for mild leukocytosis, hyponatremia, creatinine of 2.2, normal lactate, chest x-ray with left lower lobe pneumonia and moderate left pleural effusion  Review of Systems: As per HPI otherwise 14 point review of systems negative.   Past Medical History:  Diagnosis Date  . Angina   . Arthritis   . Blood transfusion   . Cancer ([place])   . Chronic kidney disease   . Clotting disorder ([place])   . COPD (chronic obstructive pulmonary disease) ([place])   . Coronary artery disease    CABG 1972, redo 1997  . Dementia   . Diabetes mellitus    IDDM  . GERD (gastroesophageal reflux disease)   . High cholesterol   . HOH (hard of hearing)    wears hearing aids sometimes  . Hypertension   . Myocardial infarction [facility])    multiple stents since CABG  . Parkinson's disease ([place])   . Skin cancer      Past Surgical History:  Procedure Laterality Date  . APPLICATION OF WOUND VAC Right 10/17/2015   Procedure: A CELL AND WOUND VAC PLACEMENT RIGHT LEG WOUND;  Surgeon: Wallace Going, DO;  Location: [place];  Service: Plastics;  Laterality: Right;  . BACK SURGERY    . CARDIAC SURGERY    . CHOLECYSTECTOMY    . COLON SURGERY    . CORONARY ARTERY BYPASS GRAFT  1972, redo 1997  . MINOR APPLICATION OF WOUND VAC Right 11/01/2015   Procedure: APPLICATION OF A-CELL OF leg and wound vac application;  Surgeon: Wallace Going, DO;  Location: [place];  Service: Plastics;  Laterality: Right;  . MINOR IRRIGATION AND DEBRIDEMENT OF WOUND Right 11/01/2015   Procedure: IRRIGATION AND DEBRIDEMENT leg;  Surgeon: Wallace Going, DO;  Location: [place];  Service: Plastics;  Laterality: Right;  . SKIN GRAFT Right 12/05/2015   Procedure: SPLIT THICKNESS SKIN GRAFT RIGHT LOWER LEG   (MINOR PROCEDURE) ;  Surgeon: Wallace Going, DO;  Location: [place];  Service: Plastics;  Laterality: Right;     reports that he has quit smoking. He has never used smokeless tobacco. He reports that he does not drink alcohol or use drugs.  No Known Allergies  Family History  Problem Relation Age of Onset  . Family history unknown: Yes     Prior to Admission medications   Medication Sig Start Date  End Date Taking? Authorizing Provider  acetaminophen (TYLENOL) 500 MG tablet Take 1,000 mg by mouth every 8 (eight) hours as needed (for pain).   Yes [provider]  amoxicillin-clavulanate (AUGMENTIN) 875-125 MG tablet Take 1 tablet by mouth 2 (two) times daily. Started 08/03 for 10 days   Yes [provider]  aspirin 81 MG tablet Take 81 mg by mouth 2 (two) times daily.   Yes [provider]  celecoxib (CELEBREX) 100 MG capsule Take 100 mg by mouth daily.   Yes [provider]  clopidogrel (PLAVIX) 75 MG  tablet Take 1 tablet (75 mg total) by mouth daily. Patient taking differently: Take 75 mg by mouth daily with breakfast.  05/20/11  Yes Barton Dubois, MD  cyanocobalamin 500 MCG tablet Take 500 mcg by mouth daily with breakfast.    Yes [provider]  donepezil (ARICEPT) 5 MG tablet Take 5 mg by mouth at bedtime.   Yes [provider]  DULoxetine (CYMBALTA) 20 MG capsule Take 20 mg by mouth daily with breakfast.    Yes [provider]  famotidine (PEPCID) 40 MG tablet Take 40 mg by mouth at bedtime.   Yes [provider]  fludrocortisone (FLORINEF) 0.1 MG tablet Take 0.1 mg by mouth daily with breakfast.    Yes [provider]  hydroxypropyl methylcellulose / hypromellose (ISOPTO TEARS / GONIOVISC) 2.5 % ophthalmic solution Place 1 drop into both eyes 3 (three) times daily with meals.   Yes [provider]  insulin glargine (LANTUS) 100 UNIT/ML injection Inject 28 Units into the skin at bedtime.    Yes [provider]  insulin lispro (HUMALOG) 100 UNIT/ML injection Inject 8-15 Units into the skin 3 (three) times daily with meals. Sliding scale. 101-150 8 units. 151-200=10 units, 201-250=12 units, 251-300=13 units, 301-350=14 units, If >350 give 15 units and Notify MD.   Yes [provider]  iron polysaccharides (NU-IRON) 150 MG capsule Take 150 mg by mouth daily with breakfast.    Yes [provider]  memantine (NAMENDA XR) 28 MG CP24 24 hr capsule Take 28 mg by mouth daily with breakfast.    Yes [provider]  metoprolol succinate (TOPROL-XL) 25 MG 24 hr tablet Take 25 mg by mouth daily with breakfast.    Yes [provider]  nitroGLYCERIN (NITROSTAT) 0.4 MG SL tablet Place 0.4 mg under the tongue every 5 (five) minutes as needed for chest pain.    Yes [provider]  nystatin (MYCOSTATIN) 100000 UNIT/ML suspension Take 5 mLs by mouth 4 (four) times daily -  with meals and at bedtime.     Yes [provider]  Omega-3 1000 MG CAPS Take 1 capsule by mouth daily.   Yes [provider]  ondansetron (ZOFRAN) 4 MG tablet Take 4 mg by mouth every 6 (six) hours as needed for nausea or vomiting.   Yes [provider]  pantoprazole (PROTONIX) 40 MG tablet Take 40 mg by mouth 2 (two) times daily.    Yes [provider]  pravastatin (PRAVACHOL) 40 MG tablet Take 40 mg by mouth daily with breakfast.    Yes [provider]  sitaGLIPtin (JANUVIA) 50 MG tablet Take 50 mg by mouth daily with breakfast.    Yes [provider]  umeclidinium-vilanterol (ANORO ELLIPTA) 62.5-25 MCG/INH AEPB Inhale 1 puff into the lungs daily.   Yes [provider]  vitamin C (ASCORBIC ACID) 500 MG tablet Take 500 mg by mouth daily with  breakfast.    Yes [provider]  zinc gluconate 50 MG tablet Take 50 mg by mouth daily with breakfast.    Yes [provider]    Physical Exam: Vitals:   01/29/17 1330 01/29/17 1400 01/29/17 1430 01/29/17 1500  BP: 131/71 132/73 134/69 112/66  Pulse: 77 81 78 83  Resp: (!) 25 (!) 39 (!) 41 19  Temp:      TempSrc:      SpO2: 93% 92% 94% 95%  Weight:      Height:          Constitutional: Frail elderly male being in bed, alert awake oriented to self only Vitals:   01/29/17 1330 01/29/17 1400 01/29/17 1430 01/29/17 1500  BP: 131/71 132/73 134/69 112/66  Pulse: 77 81 78 83  Resp: (!) 25 (!) 39 (!) 41 19  Temp:      TempSrc:      SpO2: 93% 92% 94% 95%  Weight:      Height:       Eyes: PERRL, lids and conjunctivae normal ENMT: Mucous membranes are dry.  Neck: normal, supple, no masses, no thyromegaly Respiratory: Decreased breath sounds on the left Cardiovascular: Regular rate and rhythm, tachycardic Abdomen: no tenderness, no masses palpated. Bowel sounds positive.  Musculoskeletal: no clubbing / cyanosis. No joint deformity upper and lower extremities. Good ROM, no contractures. Normal  muscle tone.  Skin: no rashes, lesions, ulcers. No induration Neurologic: Moves all extremities no localizing signs Psychiatric: Pleasant  Labs on Admission: I have personally reviewed following labs and imaging studies  CBC:  Recent Labs Lab 01/29/17 1322 01/29/17 1359  WBC 12.7*  --   NEUTROABS 9.2*  --   HGB 11.0* 11.2*  HCT 34.7* 33.0*  MCV 98.0  --   PLT 232  --    Basic Metabolic Panel:  Recent Labs Lab 01/29/17 1359 01/29/17 1447  NA 146* 148*  K 5.4* 4.3  CL 106 110  CO2  --  29  GLUCOSE 45* 59*  BUN 48* 33*  CREATININE 2.00* 2.20*  CALCIUM  --  8.6*   GFR: Estimated Creatinine Clearance: 22 mL/min (A) (by C-G formula based on SCr of 2.2 mg/dL (H)). Liver Function Tests: No results for input(s): AST, ALT, ALKPHOS, BILITOT, PROT, ALBUMIN in the last 168 hours. No results for input(s): LIPASE, AMYLASE in the last 168 hours. No results for input(s): AMMONIA in the last 168 hours. Coagulation Profile: No results for input(s): INR, PROTIME in the last 168 hours. Cardiac Enzymes: No results for input(s): CKTOTAL, CKMB, CKMBINDEX, TROPONINI in the last 168 hours. BNP (last 3 results) No results for input(s): PROBNP in the last 8760 hours. HbA1C: No results for input(s): HGBA1C in the last 72 hours. CBG: No results for input(s): GLUCAP in the last 168 hours. Lipid Profile: No results for input(s): CHOL, HDL, LDLCALC, TRIG, CHOLHDL, LDLDIRECT in the last 72 hours. Thyroid Function Tests: No results for input(s): TSH, T4TOTAL, FREET4, T3FREE, THYROIDAB in the last 72 hours. Anemia Panel: No results for input(s): VITAMINB12, FOLATE, FERRITIN, TIBC, IRON, RETICCTPCT in the last 72 hours. Urine analysis:    Component Value Date/Time   COLORURINE YELLOW 06/30/2016 [place] 06/30/2016 1341   LABSPEC 1.014 06/30/2016 1341   PHURINE 6.0 06/30/2016 1341   GLUCOSEU NEGATIVE 06/30/2016 1341   HGBUR NEGATIVE 06/30/2016 1341   BILIRUBINUR NEGATIVE  06/30/2016 1341   KETONESUR NEGATIVE 06/30/2016 1341   PROTEINUR NEGATIVE 06/30/2016 1341   UROBILINOGEN  1.0 10/25/2013 2305   NITRITE NEGATIVE 06/30/2016 1341   LEUKOCYTESUR NEGATIVE 06/30/2016 1341   Sepsis Labs: @LABRCNTIP (procalcitonin:4,lacticidven:4) )No results found for this or any previous visit (from the past 240 hour(s)).   Radiological Exams on Admission: Dg Chest 2 View  Result Date: 01/29/2017 CLINICAL DATA:  Shortness of breath for 1 day. EXAM: CHEST  2 VIEW COMPARISON:  Chest x-ray dated June 30, 2016. FINDINGS: Postsurgical changes related to prior CABG. The cardiomediastinal silhouette is normal in size. Atherosclerotic calcification of the aortic arch. Low lung volumes are present, causing crowding of the pulmonary vasculature. Large left pleural effusion with left lower lobe consolidation. The right lung is clear. No pneumothorax. No acute osseous abnormality. Old right acromioclavicular separation. IMPRESSION: Large left pleural effusion with left lower lobe consolidation, which could represent pneumonia or atelectasis. Consider further evaluation with chest CT as clinically indicated. Electronically Signed   By: Titus Dubin M.Leon.   On: 01/29/2017 14:39    EKG: Pending at this time  Assessment/Plan Principal Problem:   Aspiration pneumonia (HCC) -Aspiration pneumonia with associated moderate left likely parapneumonic fusion -Long-standing history of dysphagia, failed modified barium swallow 2 weeks ago -I will leave him on a pured diet/honey thick liquids for now, daughter is interested in getting another swallow eval opinion in our hospital system. -Discussed with daughter about current situation with advanced age, dementia, severe dysphagia and high risk of ongoing aspiration, recommended supportive care and palliative care consult, she agrees to this -I will start her on empiric Zosyn -Follow-up blood cultures -Depending on clinical course he may need a  thoracentesis down the road for therapeutic/symptom management -Repeat chest x-ray in 48 hours  Severe dysphagia -As discussed above  Alzheimer's dementia -Resume home regimen of Namenda  AKI on CKD3 with mild hypernatremia -Baseline creatinine around 1.4-1.5 -Hydrate with D5 1/2 NS, hold Celebrex and monitor     Diabetes mellitus ([place]) -Resume Lantus will cut dose in half, sliding scale   COPD with emphysema ([place]) -Stable, nebs when necessary    CAD in native artery -Stable, resume metoprolol, Plavix and statin  DVT prophylaxis: Heparin subcutaneous  Code Status: DO NOT RESUSCITATE  Family Communication: Daughter at bedside  Disposition Plan: Likely SNF depending on clinical course  Consults called: None  Admission status: Inpatient   Domenic Polite MD [facility] Pager [phone]  If 7PM-7AM, please contact night-coverage [email or web address] Password [email or web address]  01/29/2017, 4:59 PM

## 2017-01-30 DIAGNOSIS — Z7189 Other specified counseling: Secondary | ICD-10-CM

## 2017-01-30 DIAGNOSIS — J90 Pleural effusion, not elsewhere classified: Secondary | ICD-10-CM

## 2017-01-30 DIAGNOSIS — J189 Pneumonia, unspecified organism: Secondary | ICD-10-CM

## 2017-01-30 DIAGNOSIS — Z515 Encounter for palliative care: Secondary | ICD-10-CM

## 2017-01-30 DIAGNOSIS — R1310 Dysphagia, unspecified: Secondary | ICD-10-CM

## 2017-01-30 LAB — BASIC METABOLIC PANEL
ANION GAP: 7 (ref 5–15)
BUN: 30 mg/dL — AB (ref 6–20)
CALCIUM: 8.1 mg/dL — AB (ref 8.9–10.3)
CO2: 29 mmol/L (ref 22–32)
CREATININE: 1.97 mg/dL — AB (ref 0.61–1.24)
Chloride: 109 mmol/L (ref 101–111)
GFR calc Af Amer: 32 mL/min — ABNORMAL LOW (ref >60)
GFR, EST NON AFRICAN AMERICAN: 28 mL/min — AB (ref >60)
GLUCOSE: 157 mg/dL — AB (ref 65–99)
Potassium: 4.2 mmol/L (ref 3.5–5.1)
Sodium: 145 mmol/L (ref 135–145)

## 2017-01-30 LAB — CBC
HEMATOCRIT: 31.8 % — AB (ref 39.0–52.0)
Hemoglobin: 9.9 g/dL — ABNORMAL LOW (ref 13.0–17.0)
MCH: 30.3 pg (ref 26.0–34.0)
MCHC: 31.1 g/dL (ref 30.0–36.0)
MCV: 97.2 fL (ref 78.0–100.0)
PLATELETS: 174 10*3/uL (ref 150–400)
RBC: 3.27 MIL/uL — ABNORMAL LOW (ref 4.22–5.81)
RDW: 15.3 % (ref 11.5–15.5)
WBC: 7.7 10*3/uL (ref 4.0–10.5)

## 2017-01-30 LAB — GLUCOSE, CAPILLARY
Glucose-Capillary: 146 mg/dL — ABNORMAL HIGH (ref 65–99)
Glucose-Capillary: 124 mg/dL — ABNORMAL HIGH (ref 65–99)
Glucose-Capillary: 108 mg/dL — ABNORMAL HIGH (ref 65–99)

## 2017-01-30 MED ORDER — DEXTROSE-NACL 5-0.45 % IV SOLN
INTRAVENOUS | Status: DC
  Administered 2017-01-30: 10:00:00 via INTRAVENOUS

## 2017-01-30 MED ORDER — DEXTROSE-NACL 5-0.45 % IV SOLN
INTRAVENOUS | Status: DC
  Administered 2017-01-31 – 2017-02-02 (×3): via INTRAVENOUS

## 2017-01-30 NOTE — Progress Notes (Signed)
PROGRESS NOTE    EDIEL Sharp  AJG:[id number] DOB: [birth year] DOA: 01/29/2017 PCP: Jefm Petty, MD  Brief Narrative:Leon Sharp is a 83 year old male with multiple medical problems most notable for dementia, debility/wheelchair-bound, COPD, CAD with numerous stents, severe dysphagia, history of congestive heart failure, chronic kidney disease stage III was diagnosed with aspiration pneumonia last Friday and was started on Augmentin then. He's had extensive workup for dysphagia, his most recent modified barium swallow was 2 weeks ago, where it was recommended to place a PEG tube, which was declined by his daughter and patient continued on a pured diet with honey thick liquids. He also had an endoscopy a week ago, he was noted to have a mild esophageal stricture which was dilated then. He was brought to the emergency room today due to increased shortness of breath, Workup in the ED notable for mild leukocytosis, hyponatremia, creatinine of 2.2, normal lactate, chest x-ray with left lower lobe pneumonia and moderate left pleural effusion  Assessment & Plan:  Aspiration pneumonia (HCC) -Long-standing history of dysphagia  -with moderate left parapneumonic effusion, will repeat CXR in am -Long-standing history of dysphagia, failed modified barium swallow 2 weeks ago -continue pured diet/honey thick liquids for now, SLP reconsulted per daughter's request - had a long discussion with daughter regarding natural trajectory of illness with advanced age, dementia, and severe dysphagia, aspiration , and the background of multiple medical problems . - Palliative medicine consult requested - Continue empiric IV Zosyn  - Blood cultures no growth 1 day  - Repeat chest x-ray tomorrow, will need to decide on therapeutic thoracentesis depending on clinical condition and size of effusion  Severe dysphagia -As above, repeat SLP evaluation today  Alzheimer's dementia -Continue Namenda  AKI  on CKD3 with mild hypernatremia -Baseline creatinine around 1.4-1.5Holding Celebrex, - creatinine improving will stop IV fluids and 2 hours     Diabetes mellitus (HCC) -Continue half dose Lantus and sliding-scale   COPD with emphysema (HCC) -Stable, nebs when necessary    CAD in native artery -Stable, resume metoprolol, Plavix and statin  DVT prophylaxis: Heparin subcutaneous  Code Status: DO NOT RESUSCITATE  Family Communication: Daughter at bedside  Disposition Plan: Likely SNF versus hospice depending on clinical course   Consultants:   Palliative medicine  Antimicrobials:   IV Zosyn since 8/9   Subjective: Reports some cough, and denies significant shortness of breath however has been bedbound since admission, no nausea vomiting no abdominal pain  Objective: Vitals:   01/29/17 1730 01/29/17 1859 01/29/17 2130 01/30/17 0658  BP: 117/67 135/73 135/63 (!) 142/72  Pulse:  81 77 75  Resp:  18 20 16   Temp:  97.6 F (36.4 C) (!) 97.4 F (36.3 C) 97.8 F (36.6 C)  TempSrc:  Oral Oral Oral  SpO2:   99% 98%  Weight:      Height:        Intake/Output Summary (Last 24 hours) at 01/30/17 1220 Last data filed at 01/30/17 3559  Gross per 24 hour  Intake           788.75 ml  Output              300 ml  Net           488.75 ml   Filed Weights   01/29/17 1304  Weight: 72.6 kg (160 lb)    Examination:  General exam: Frail elderly white male, laying in bed, no distress, cachectic Respiratory system: Decreased breath sounds in the  left Cardiovascular system: S1 & S2 heard, RRR. Gastrointestinal system: Abdomen is nondistended, soft and nontender.  Normal bowel sounds heard. Central nervous system: Alert, oriented to self only Extremities: Moves all extremities, no localizing signs Skin: Some redness noted on his right upper eyelid Psychiatry: Flat affect    Data Reviewed:   CBC:  Recent Labs Lab 01/29/17 1322 01/29/17 1359 01/30/17 0413  WBC 12.7*  --   7.7  NEUTROABS 9.2*  --   --   HGB 11.0* 11.2* 9.9*  HCT 34.7* 33.0* 31.8*  MCV 98.0  --  97.2  PLT 232  --  846   Basic Metabolic Panel:  Recent Labs Lab 01/29/17 1359 01/29/17 1447 01/30/17 0413  NA 146* 148* 145  K 5.4* 4.3 4.2  CL 106 110 109  CO2  --  29 29  GLUCOSE 45* 59* 157*  BUN 48* 33* 30*  CREATININE 2.00* 2.20* 1.97*  CALCIUM  --  8.6* 8.1*   GFR: Estimated Creatinine Clearance: 24.6 mL/min (A) (by C-G formula based on SCr of 1.97 mg/dL (H)). Liver Function Tests: No results for input(s): AST, ALT, ALKPHOS, BILITOT, PROT, ALBUMIN in the last 168 hours. No results for input(s): LIPASE, AMYLASE in the last 168 hours. No results for input(s): AMMONIA in the last 168 hours. Coagulation Profile: No results for input(s): INR, PROTIME in the last 168 hours. Cardiac Enzymes: No results for input(s): CKTOTAL, CKMB, CKMBINDEX, TROPONINI in the last 168 hours. BNP (last 3 results) No results for input(s): PROBNP in the last 8760 hours. HbA1C: No results for input(s): HGBA1C in the last 72 hours. CBG:  Recent Labs Lab 01/29/17 2126  GLUCAP 88   Lipid Profile: No results for input(s): CHOL, HDL, LDLCALC, TRIG, CHOLHDL, LDLDIRECT in the last 72 hours. Thyroid Function Tests: No results for input(s): TSH, T4TOTAL, FREET4, T3FREE, THYROIDAB in the last 72 hours. Anemia Panel: No results for input(s): VITAMINB12, FOLATE, FERRITIN, TIBC, IRON, RETICCTPCT in the last 72 hours. Urine analysis:    Component Value Date/Time   COLORURINE YELLOW 06/30/2016 1341   APPEARANCEUR CLEAR 06/30/2016 1341   LABSPEC 1.014 06/30/2016 1341   PHURINE 6.0 06/30/2016 1341   GLUCOSEU NEGATIVE 06/30/2016 1341   HGBUR NEGATIVE 06/30/2016 1341   BILIRUBINUR NEGATIVE 06/30/2016 1341   KETONESUR NEGATIVE 06/30/2016 1341   PROTEINUR NEGATIVE 06/30/2016 1341   UROBILINOGEN 1.0 10/25/2013 2305   NITRITE NEGATIVE 06/30/2016 1341   LEUKOCYTESUR NEGATIVE 06/30/2016 1341   Sepsis  Labs: @LABRCNTIP (procalcitonin:4,lacticidven:4)  ) Recent Results (from the past 240 hour(s))  MRSA PCR Screening     Status: Abnormal   Collection Time: 01/29/17  7:58 PM  Result Value Ref Range Status   MRSA by PCR POSITIVE (A) NEGATIVE Final    Comment:        The GeneXpert MRSA Assay (FDA approved for NASAL specimens only), is one component of a comprehensive MRSA colonization surveillance program. It is not intended to diagnose MRSA infection nor to guide or monitor treatment for MRSA infections. RESULT CALLED TO, READ BACK BY AND VERIFIED WITH: BELMA CERIC,RN [id number] @ 8413 BY J SCOTTON          Radiology Studies: Dg Chest 2 View  Result Date: 01/29/2017 CLINICAL DATA:  Shortness of breath for 1 day. EXAM: CHEST  2 VIEW COMPARISON:  Chest x-ray dated June 30, 2016. FINDINGS: Postsurgical changes related to prior CABG. The cardiomediastinal silhouette is normal in size. Atherosclerotic calcification of the aortic arch. Low lung volumes are present, causing  crowding of the pulmonary vasculature. Large left pleural effusion with left lower lobe consolidation. The right lung is clear. No pneumothorax. No acute osseous abnormality. Old right acromioclavicular separation. IMPRESSION: Large left pleural effusion with left lower lobe consolidation, which could represent pneumonia or atelectasis. Consider further evaluation with chest CT as clinically indicated. Electronically Signed   By: Titus Dubin M.D.   On: 01/29/2017 14:39        Scheduled Meds: . aspirin EC  81 mg Oral BID  . Chlorhexidine Gluconate Cloth  6 each Topical Q0600  . clopidogrel  75 mg Oral Q breakfast  . donepezil  5 mg Oral QHS  . DULoxetine  20 mg Oral Q breakfast  . famotidine  40 mg Oral QHS  . fludrocortisone  0.1 mg Oral Q breakfast  . heparin  5,000 Units Subcutaneous Q8H  . insulin glargine  12 Units Subcutaneous QHS  . memantine  28 mg Oral Q breakfast  . metoprolol succinate  25 mg Oral  Q breakfast  . mupirocin ointment  1 application Nasal BID  . nystatin  5 mL Oral TID WC & HS  . pantoprazole  40 mg Oral BID  . polyvinyl alcohol  1 drop Both Eyes TID WC  . pravastatin  40 mg Oral Q breakfast  . umeclidinium-vilanterol  1 puff Inhalation Daily   Continuous Infusions: . dextrose 5 % and 0.45% NaCl 50 mL/hr at 01/30/17 1027  . piperacillin-tazobactam (ZOSYN)  IV Stopped (01/30/17 0726)     LOS: 1 day    Time spent: 61min    Domenic Polite, MD [facility] Pager [phone]  If 7PM-7AM, please contact night-coverage [email or web address] Password [email or web address] 01/30/2017, 12:20 PM

## 2017-01-30 NOTE — Consult Note (Signed)
Consultation Note Date: 01/30/2017   Patient Name: Leon Sharp  DOB: 08/11/1921  MRN: [id number]  Age / Sex: 83 y.o., male  PCP: Jefm Petty, MD Referring Physician: Domenic Polite, MD  Reason for Consultation: Establishing goals of care  HPI/Patient Profile: 83 y.o. male  with past medical history of  Dementia, wheelchair bound after several falls since May 2018, COPD, CAD, chronic GERD, recently diagnosed with asp pna and placed on Abx, recently underwent EGD for esophageal dilation, recently underwent MBS study, was noted to be a high aspiration risk, recommendations were made for PEG tube, daughter did not feel this would be in the patient's best interests, hence, he was then placed on honey thick liquids/pureed foods, now  admitted on 01/29/2017 with worsening shortness of breath, dehydration, poor PO intake, asp pna, also with L para pneumonic effusion with ongoing severe dysphagia, additionally with an element of AKI with underlying stage III CKD.    The patient is placed on IVF, IV Abx, lab and imaging is being followed, a palliative consult has been placed for additional goals of care discussions.   The patient is resting in bed, he does not appear to be in distress, currently, not awake alert enough to participate in discussions or decision making. Daughter at bedside, I introduced myself and palliative care as follows: Palliative medicine is specialized medical care for people living with serious illness. It focuses on providing relief from the symptoms and stress of a serious illness. The goal is to improve quality of life for both the patient and the family.  Brief life review performed, patient has been in independent living, then has been at [facility] since November 2017, he has not been ambulatory since May 2018, has had several falls, has also been at [facility] for rehab a few  months ago.   We discussed about the patient's overall conditions leading upto this hospitalization in detail, see discussions and recommendations below:   Clinical Assessment and Goals of Care:   HCPOA  daughter Belva Crome [phone].  Has elderly spouse, has a son who lives in [place], [place]    1. Agree with DNR DNI 2. Agree with no PEG decision made by daughter 2 weeks ago.   3. Discussed about comfort feeds, careful hand assisted feedings, await SLP eval, patient currently not alert/awake enough to participate. Dietician consult.   4. Discussed with daughter about observing his disease trajectory and monitor hospital course, follow up repeat CXR, follow up SLP recommendations, monitor level of awakeness/alertness and PO intake.    If the patient has some degree of meaningful stabilization/recovery, then we can consider D/C back to ALF with hospice versus SNF needs.    If the patient has no significant clinical recovery, if his CXR shows worsening, if he is not eating/drinking anything at all, then we will have to consider hospice philosophy of care, that would focus only on comfort measures, that would entail sending the patient to a residential hospice facility.  Daughter is tearful but she is well aware of the possibility of the patient likely needing hospice service sin the near future. She lives in [place], [place] and [facility] [place], [place], should the patient be deemed eligible for such in the next 24-48 hours based on his clinical picture.   We will continue to follow along and help guide decision making, thank you for the consult.   Code Status/Advance Care Planning:  DNR    Symptom Management:    as above   Palliative Prophylaxis:   Bowel Regimen   Psycho-social/Spiritual:   Desire for further Chaplaincy support:no  Additional Recommendations: Education on Hospice  Prognosis:   Guarded, monitor trajectory, will need  hospice care if he has ongoing decline.   Discharge Planning: To Be Determined      Primary Diagnoses: Present on Admission: . Aspiration pneumonia ([place]) . Pleural effusion . Hypertension . Dementia . COPD with emphysema ([place]) . CAD in native artery . AKI (acute kidney injury) ([place])   I have reviewed the medical record, interviewed the patient and family, and examined the patient. The following aspects are pertinent.  Past Medical History:  Diagnosis Date  . Angina   . Arthritis   . Blood transfusion   . Cancer ([place])   . Chronic kidney disease   . Clotting disorder ([place])   . COPD (chronic obstructive pulmonary disease) ([place])   . Coronary artery disease    CABG 1972, redo 1997  . Dementia   . Diabetes mellitus    IDDM  . GERD (gastroesophageal reflux disease)   . High cholesterol   . HOH (hard of hearing)    wears hearing aids sometimes  . Hypertension   . Myocardial infarction [facility])    multiple stents since CABG  . Parkinson's disease ([place])   . Skin cancer    Social History   Social History  . Marital status: Widowed    Spouse name: N/A  . Number of children: N/A  . Years of education: N/A   Social History Main Topics  . Smoking status: Former Research scientist (life sciences)  . Smokeless tobacco: Never Used  . Alcohol use No  . Drug use: No  . Sexual activity: No   Other Topics Concern  . None   Social History Narrative  . None   Family History  Problem Relation Age of Onset  . Family history unknown: Yes   Scheduled Meds: . aspirin EC  81 mg Oral BID  . Chlorhexidine Gluconate Cloth  6 each Topical Q0600  . clopidogrel  75 mg Oral Q breakfast  . donepezil  5 mg Oral QHS  . DULoxetine  20 mg Oral Q breakfast  . famotidine  40 mg Oral QHS  . fludrocortisone  0.1 mg Oral Q breakfast  . heparin  5,000 Units Subcutaneous Q8H  . insulin glargine  12 Units Subcutaneous QHS  . memantine  28 mg Oral Q breakfast  . metoprolol succinate  25 mg Oral Q breakfast  .  mupirocin ointment  1 application Nasal BID  . nystatin  5 mL Oral TID WC & HS  . pantoprazole  40 mg Oral BID  . polyvinyl alcohol  1 drop Both Eyes TID WC  . pravastatin  40 mg Oral Q breakfast  . umeclidinium-vilanterol  1 puff Inhalation Daily   Continuous Infusions: . dextrose 5 % and 0.45% NaCl 50 mL/hr at 01/30/17 1027  . piperacillin-tazobactam (ZOSYN)  IV Stopped (01/30/17 0726)   PRN  Meds:.acetaminophen, ondansetron **OR** ondansetron (ZOFRAN) IV Medications Prior to Admission:  Prior to Admission medications   Medication Sig Start Date End Date Taking? Authorizing Provider  acetaminophen (TYLENOL) 500 MG tablet Take 1,000 mg by mouth every 8 (eight) hours as needed (for pain).   Yes [provider]  amoxicillin-clavulanate (AUGMENTIN) 875-125 MG tablet Take 1 tablet by mouth 2 (two) times daily. Started 08/03 for 10 days   Yes [provider]  aspirin 81 MG tablet Take 81 mg by mouth 2 (two) times daily.   Yes [provider]  celecoxib (CELEBREX) 100 MG capsule Take 100 mg by mouth daily.   Yes [provider]  clopidogrel (PLAVIX) 75 MG tablet Take 1 tablet (75 mg total) by mouth daily. Patient taking differently: Take 75 mg by mouth daily with breakfast.  05/20/11  Yes Barton Dubois, MD  cyanocobalamin 500 MCG tablet Take 500 mcg by mouth daily with breakfast.    Yes [provider]  donepezil (ARICEPT) 5 MG tablet Take 5 mg by mouth at bedtime.   Yes [provider]  DULoxetine (CYMBALTA) 20 MG capsule Take 20 mg by mouth daily with breakfast.    Yes [provider]  famotidine (PEPCID) 40 MG tablet Take 40 mg by mouth at bedtime.   Yes [provider]  fludrocortisone (FLORINEF) 0.1 MG tablet Take 0.1 mg by mouth daily with breakfast.    Yes [provider]  hydroxypropyl methylcellulose / hypromellose (ISOPTO TEARS / GONIOVISC) 2.5 % ophthalmic solution Place 1 drop into both eyes 3 (three)  times daily with meals.   Yes [provider]  insulin glargine (LANTUS) 100 UNIT/ML injection Inject 28 Units into the skin at bedtime.    Yes [provider]  insulin lispro (HUMALOG) 100 UNIT/ML injection Inject 8-15 Units into the skin 3 (three) times daily with meals. Sliding scale. 101-150 8 units. 151-200=10 units, 201-250=12 units, 251-300=13 units, 301-350=14 units, If >350 give 15 units and Notify MD.   Yes [provider]  iron polysaccharides (NU-IRON) 150 MG capsule Take 150 mg by mouth daily with breakfast.    Yes [provider]  memantine (NAMENDA XR) 28 MG CP24 24 hr capsule Take 28 mg by mouth daily with breakfast.    Yes [provider]  metoprolol succinate (TOPROL-XL) 25 MG 24 hr tablet Take 25 mg by mouth daily with breakfast.    Yes [provider]  nitroGLYCERIN (NITROSTAT) 0.4 MG SL tablet Place 0.4 mg under the tongue every 5 (five) minutes as needed for chest pain.    Yes [provider]  nystatin (MYCOSTATIN) 100000 UNIT/ML suspension Take 5 mLs by mouth 4 (four) times daily -  with meals and at bedtime.    Yes [provider]  Omega-3 1000 MG CAPS Take 1 capsule by mouth daily.   Yes [provider]  ondansetron (ZOFRAN) 4 MG tablet Take 4 mg by mouth every 6 (six) hours as needed for nausea or vomiting.   Yes [provider]  pantoprazole (PROTONIX) 40 MG tablet Take 40 mg by mouth 2 (two) times daily.    Yes [provider]  pravastatin (PRAVACHOL) 40 MG tablet Take 40 mg by mouth daily with breakfast.    Yes [provider]  sitaGLIPtin (JANUVIA) 50 MG tablet Take 50 mg by mouth daily with breakfast.    Yes [provider]  umeclidinium-vilanterol (ANORO ELLIPTA) 62.5-25 MCG/INH AEPB Inhale 1 puff into the lungs daily.  Yes [provider]  vitamin C (ASCORBIC ACID) 500 MG tablet Take 500 mg by mouth daily with breakfast.    Yes [provider]  zinc gluconate 50 MG tablet Take 50 mg by mouth daily with breakfast.    Yes [provider]   No Known Allergies Review of Systems Non verbal currently,.   Physical Exam Weak frail elderly gentleman NAD Resting in bed Diminished breath sounds No increased work of breathing at rest  S1 S2 Abdomen soft Thin extremities, muscle wasting evident Asleep currently  Vital Signs: BP (!) 142/72 (BP Location: Left Arm)   Pulse 75   Temp 97.8 F (36.6 C) (Oral)   Resp 16   Ht 5\' 10"  (1.778 m)   Wt 72.6 kg (160 lb)   SpO2 98%   BMI 22.96 kg/m  Pain Assessment: Faces       SpO2: SpO2: 98 % O2 Device:SpO2: 98 % O2 Flow Rate: .O2 Flow Rate (L/min): 2.5 L/min  IO: Intake/output summary:  Intake/Output Summary (Last 24 hours) at 01/30/17 1154 Last data filed at 01/30/17 3817  Gross per 24 hour  Intake           788.75 ml  Output              300 ml  Net           488.75 ml    LBM: Last BM Date: 01/28/17 Baseline Weight: Weight: 72.6 kg (160 lb) Most recent weight: Weight: 72.6 kg (160 lb)     Palliative Assessment/Data:   Flowsheet Rows     Most Recent Value  Intake Tab  Referral Department  Hospitalist  Unit at Time of Referral  Med/Surg Unit  Palliative Care Primary Diagnosis  Other (Comment) [dementia, dysphagia, asp pna, para pneumonic effusion. ]  Palliative Care Type  New Palliative care  Reason for referral  Clarify Goals of Care, Counsel Regarding Hospice  Date first seen by Palliative Care  01/30/17  Clinical Assessment  Palliative Performance Scale Score  10%  Pain Max last 24 hours  4  Pain Min Last 24 hours  3  Dyspnea Max Last 24 Hours  3  Dyspnea Min Last 24 hours  2  Nausea Max Last 24 Hours  3  Nausea Min Last 24 Hours  2  Anxiety Max Last 24 Hours  3  Anxiety Min Last 24 Hours  2  Psychosocial & Spiritual Assessment  Palliative Care Outcomes  Patient/Family meeting held?  Yes  Who was at the meeting?  [place]  daughter, patient was not very responsive.   Palliative Care Outcomes  Clarified goals of care      Time In:  10.40 Time Out:  11.50 Time Total:  70 min  Greater than 50%  of this time was spent counseling and coordinating care related to the above assessment and plan.  Signed by: Loistine Chance, MD  [phone]  Please contact Palliative Medicine Team phone at [phone] for questions and concerns.  For individual provider: See Shea Evans

## 2017-01-30 NOTE — Progress Notes (Signed)
Received call from [facility] [phone], pt's RN at [facility]. (Assessment completed by pm shift CSW, CSW following as pt is admitted from facility). She states at baseline he is 1-person assist and eats independently, needs assistance bathing/toileting. Requests to be kept updated as work-up continues and DC planning can progress.   Sharren Bridge, MSW, LCSW Clinical Social Work 01/30/2017 [phone]

## 2017-01-30 NOTE — Evaluation (Signed)
Clinical/Bedside Swallow Evaluation Patient Details  Name: Leon Sharp MRN: [id number] Date of Birth: 1921-08-10  Today's Date: 01/30/2017 Time: SLP Start Time (ACUTE ONLY): 0845 SLP Stop Time (ACUTE ONLY): 0925 SLP Time Calculation (min) (ACUTE ONLY): 40 min  Past Medical History:  Past Medical History:  Diagnosis Date  . Angina   . Arthritis   . Blood transfusion   . Cancer ([place])   . Chronic kidney disease   . Clotting disorder ([place])   . COPD (chronic obstructive pulmonary disease) ([employer])   . Coronary artery disease    CABG 1972, redo 1997  . Dementia   . Diabetes mellitus    IDDM  . GERD (gastroesophageal reflux disease)   . High cholesterol   . HOH (hard of hearing)    wears hearing aids sometimes  . Hypertension   . Myocardial infarction [facility])    multiple stents since CABG  . Parkinson's disease ([place])   . Skin cancer    Past Surgical History:  Past Surgical History:  Procedure Laterality Date  . APPLICATION OF WOUND VAC Right 10/17/2015   Procedure: A CELL AND WOUND VAC PLACEMENT RIGHT LEG WOUND;  Surgeon: [place] Going, DO;  Location: [place];  Service: Plastics;  Laterality: Right;  . BACK SURGERY    . CARDIAC SURGERY    . CHOLECYSTECTOMY    . COLON SURGERY    . CORONARY ARTERY BYPASS GRAFT  1972, redo 1997  . MINOR APPLICATION OF WOUND VAC Right 11/01/2015   Procedure: APPLICATION OF A-CELL OF leg and wound vac application;  Surgeon: [place] Going, DO;  Location: [facility];  Service: Plastics;  Laterality: Right;  . MINOR IRRIGATION AND DEBRIDEMENT OF WOUND Right 11/01/2015   Procedure: IRRIGATION AND DEBRIDEMENT leg;  Surgeon: [place] Going, DO;  Location: [facility];  Service: Plastics;  Laterality: Right;  . SKIN GRAFT Right 12/05/2015   Procedure: SPLIT THICKNESS SKIN GRAFT RIGHT LOWER LEG   (MINOR PROCEDURE) ;  Surgeon: [place] Going, DO;  Location: [facility];   Service: Plastics;  Laterality: Right;   HPI:  83 yo male adm to [facility] with respiratory difficulties due to aspiration pneumonia.  PMH + for Parkinsons', demenia, MI GERD, HOH, HTN, skin cancer, arthritis, clotting d/o, esophageal dysphagia and oropharyngeal dysphagia.   Pt recently seen at [facility] for MBS on 01/13/2017 - recommendation for NPO vs puree/HTL if desire to feed pt with known risks.    Family determined to proceed with puree/honey diet with strategies including small bites/double swallows/alternating foods/drinks.  CXR finding concerning for potential left lower lobe pna. Swallow evaluation ordered at [facility].    Assessment / Plan / Recommendation Clinical Impression  Pt's swallow ability currently clinically appears worse than MBS 01/13/2017- suspect exacerbated by current medical illness.  Pt did awaken and follow directions to cough and readily allowed oral care - opening mouth.  He did require total cues and followed directions approximately 25% of the time to "close" on oral suction.  After extensvie oral care, trials of nectar via tsp and  honey via tsp and cup provided.    Pt only elicited swallow x1 - which was very delayed and followed by delayed weak cough.  Pt did cough secretions into oral cavity x1 which SLP was able to remove.  Voice is very weak.    The process of providing pt with oral care over 20 minutes wore him out .  At this  time, pt will aspirate with any consistency provided - note plan for palliative referral today.  Frankly po intake for him is likely not comforting at this time.  Skilled intervention including determining strategy helpful to maximize airwy protection.  Advised RN to oral care provided and recommend to continue.    Recommend NPO pending palliative meeting/decision for comfort.  SLP does not recommend PEG tube as this will not prevent aspiration nor change outcomes.  Will follow up for family education - MBS not indicated as it will not change pt's outcomes.    SLP Visit Diagnosis: Dysphagia, oropharyngeal phase (R13.12) (known esophageal dysmotility - severe presbyesophagus)    Aspiration Risk       Diet Recommendation NPO (oral care)        Other  Recommendations Oral Care Recommendations: Oral care QID   Follow up Recommendations        Frequency and Duration min 1 x/week  1 week       Prognosis        Swallow Study   General Date of Onset: 01/30/17 HPI: 83 yo male adm to [facility] with respiratory difficulties due to aspiration pneumonia.  PMH + for Parkinsons', demenia, MI GERD, HOH, HTN, skin cancer, arthritis, clotting d/o, esophageal dysphagia and oropharyngeal dysphagia.   Pt recently seen at [facility] for MBS on 01/13/2017 - recommendation for NPO vs puree/HTL if desire to feed pt with known risks.    Family determined to proceed with puree/honey diet with strategies including small bites/double swallows/alternating foods/drinks.  CXR finding concerning for potential left lower lobe pna. Swallow evaluation ordered at [facility].  Type of Study: Bedside Swallow Evaluation Diet Prior to this Study: Dysphagia 1 (puree);Honey-thick liquids Temperature Spikes Noted: No History of Recent Intubation: No Behavior/Cognition: Other (Comment) (sleepy but participative and followed directons) Oral Cavity Assessment: Dried secretions (oral suction set up and care provided clearing viscous yellow tinged secretions) Oral Care Completed by SLP: Yes Oral Cavity - Dentition: Edentulous (dentures possibly in room but were not needed at this time) Self-Feeding Abilities: Total assist Patient Positioning: Upright in bed Baseline Vocal Quality: Low vocal intensity Volitional Cough: Weak (intermittently productive) Volitional Swallow: Unable to elicit    Oral/Motor/Sensory Function Overall Oral Motor/Sensory Function: Generalized oral weakness (gross weakness)   Ice Chips Ice chips: Not tested   Thin Liquid Thin Liquid: Not tested    Nectar Thick Nectar Thick  Liquid: Impaired Presentation: Spoon Oral Phase Impairments: Reduced labial seal;Reduced lingual movement/coordination Oral phase functional implications: Oral holding Other Comments: pt did not elicit swallow and slp orally suctioned to remove after total cues to swallow with dry spoon stimulation x3   Honey Thick Honey Thick Liquid: Impaired Presentation: Cup;Spoon Oral Phase Impairments: Reduced labial seal;Reduced lingual movement/coordination Oral Phase Functional Implications: Prolonged oral transit;Oral holding Pharyngeal Phase Impairments: Suspected delayed Swallow;Cough - Delayed Other Comments: delayed congested cough   Puree Puree: Not tested   Solid   GO   Solid: Not tested        Leon Sharp 01/30/2017,9:58 AM  Luanna Salk, [place] [facility] SLP [phone]

## 2017-01-31 ENCOUNTER — Inpatient Hospital Stay (HOSPITAL_COMMUNITY): Payer: Medicare Other

## 2017-01-31 DIAGNOSIS — Z515 Encounter for palliative care: Secondary | ICD-10-CM

## 2017-01-31 LAB — CBC
HCT: 34.3 % — ABNORMAL LOW (ref 39.0–52.0)
Hemoglobin: 10.7 g/dL — ABNORMAL LOW (ref 13.0–17.0)
MCH: 31.1 pg (ref 26.0–34.0)
MCHC: 31.2 g/dL (ref 30.0–36.0)
MCV: 99.7 fL (ref 78.0–100.0)
PLATELETS: 168 10*3/uL (ref 150–400)
RBC: 3.44 MIL/uL — ABNORMAL LOW (ref 4.22–5.81)
RDW: 15.8 % — ABNORMAL HIGH (ref 11.5–15.5)
WBC: 7.5 10*3/uL (ref 4.0–10.5)

## 2017-01-31 LAB — BASIC METABOLIC PANEL
Anion gap: 8 (ref 5–15)
BUN: 27 mg/dL — AB (ref 6–20)
CO2: 29 mmol/L (ref 22–32)
CREATININE: 2.21 mg/dL — AB (ref 0.61–1.24)
Calcium: 7.9 mg/dL — ABNORMAL LOW (ref 8.9–10.3)
Chloride: 110 mmol/L (ref 101–111)
GFR calc Af Amer: 28 mL/min — ABNORMAL LOW (ref >60)
GFR, EST NON AFRICAN AMERICAN: 24 mL/min — AB (ref >60)
Glucose, Bld: 90 mg/dL (ref 65–99)
Potassium: 3.9 mmol/L (ref 3.5–5.1)
SODIUM: 147 mmol/L — AB (ref 135–145)

## 2017-01-31 LAB — URINALYSIS, ROUTINE W REFLEX MICROSCOPIC
Bilirubin Urine: NEGATIVE
Glucose, UA: NEGATIVE mg/dL
Ketones, ur: NEGATIVE mg/dL
Nitrite: NEGATIVE
Protein, ur: 30 mg/dL — AB
Specific Gravity, Urine: 1.014 (ref 1.005–1.030)
pH: 5 (ref 5.0–8.0)

## 2017-01-31 LAB — GLUCOSE, CAPILLARY
GLUCOSE-CAPILLARY: 88 mg/dL (ref 65–99)
Glucose-Capillary: 104 mg/dL — ABNORMAL HIGH (ref 65–99)
Glucose-Capillary: 120 mg/dL — ABNORMAL HIGH (ref 65–99)
Glucose-Capillary: 169 mg/dL — ABNORMAL HIGH (ref 65–99)

## 2017-01-31 MED ORDER — FAMOTIDINE 20 MG PO TABS
20.0000 mg | ORAL_TABLET | Freq: Every day | ORAL | Status: DC
  Administered 2017-02-02: 20 mg via ORAL
  Filled 2017-01-31 (×3): qty 1

## 2017-01-31 MED ORDER — SODIUM CHLORIDE 0.9 % IV BOLUS (SEPSIS)
500.0000 mL | Freq: Once | INTRAVENOUS | Status: AC
  Administered 2017-01-31: 500 mL via INTRAVENOUS

## 2017-01-31 NOTE — Progress Notes (Signed)
Pt b/p s/p 500cc bolus is now 110/72 and stable. Will continue to monitor. [place]

## 2017-01-31 NOTE — Progress Notes (Signed)
PROGRESS NOTE    Leon Sharp  ZOX:[id number] DOB: 08-Feb-1922 DOA: 01/29/2017 PCP: Jefm Petty, MD  Brief Narrative:Leon Sharp is a 83 year old male with multiple medical problems most notable for dementia, debility/wheelchair-bound, COPD, CAD with numerous stents, severe dysphagia, history of congestive heart failure, chronic kidney disease stage III was diagnosed with aspiration pneumonia last Friday and was started on Augmentin then. He's had extensive workup for dysphagia, his most recent modified barium swallow was 2 weeks ago, where it was recommended to place a PEG tube, which was declined by his daughter and patient continued on a pured diet with honey thick liquids. He also had an endoscopy a week ago, he was noted to have a mild esophageal stricture which was dilated then. He was brought to the emergency room today due to increased shortness of breath, Workup in the ED notable for mild leukocytosis, hyponatremia, creatinine of 2.2, normal lactate, chest x-ray with left lower lobe pneumonia and moderate left pleural effusion  Assessment & Plan:  Aspiration pneumonia (HCC) with large L effusion -Long-standing history of dysphagia  -Repeat chest x-ray today with worsening large left likely parapneumonic effusion -Long-standing history of dysphagia, failed modified barium swallow 2 weeks ago, speech therapist following but unable to evaluate at this time due to depressed mentation -Appreciate palliative consult -Talked to the daughter again today recommended, palliative/comfort-based care, should he become symptomatic from his pleural effusion will do therapeutic thoracentesis tomorrow, she would like to hold off on this for now since he does not appear to be in distress, also discussed poor prognosis with advanced age, dementia, bedbound state status, severe dysphagia, worsening kidney function  Severe dysphagia -As above, SLP following  Alzheimer's dementia -unable to  resume Namenda  AKI on CKD3 with mild hypernatremia -Baseline creatinine around 1.4-1.5Holding Celebrex, -creatinine briefly improved yesterday and then worsened again along with soft blood pressures and low urine output last night -plan to focus on comfort based care for now  DM -Stop Lantus due low CBGs and worsening renal function  -SSI   COPD with emphysema ([place]) -Stable, nebs when necessary    CAD in native artery -Stable, resume metoprolol when able, Plavix and statin  DVT prophylaxis: Heparin subcutaneous  Code Status: DO NOT RESUSCITATE  Family Communication:  long discussion with daughter at bedside Disposition Plan:  will likely need residential hospice   Consultants:   Palliative medicine  Antimicrobials:   IV Zosyn since 8/9   Subjective: Remained somnolent for the most part, opens eyes, mumbles yes and no, drop in urine output overnight  Objective: Vitals:   01/30/17 2230 01/31/17 0047 01/31/17 0216 01/31/17 0610  BP: (!) 80/50 (!) 94/53 110/72 121/66  Pulse:    67  Resp:    20  Temp:    97.7 F (36.5 C)  TempSrc:    Axillary  SpO2:    100%  Weight:      Height:        Intake/Output Summary (Last 24 hours) at 01/31/17 1318 Last data filed at 01/31/17 0900  Gross per 24 hour  Intake             1000 ml  Output              350 ml  Net              650 ml   Filed Weights   01/29/17 1304  Weight: 72.6 kg (160 lb)    Examination:  Gen:  Frail elderly  white male laying in bed, no distress, extremely cachectic HEENT: Pupils small but reactive, neck supple Lungs: Decreased breath sounds at the left, no respiratory distress CVS: S1-S2 tachycardic, systolic murmur appreciated Abdomen soft scaphoid nontender bowel sounds present Extremities: Trace edema Neuro: Obtunded, opens eyes, follows some verbal commands, moves all extremities to command  Data Reviewed:   CBC:  Recent Labs Lab 01/29/17 1322 01/29/17 1359 01/30/17 0413  01/31/17 0426  WBC 12.7*  --  7.7 7.5  NEUTROABS 9.2*  --   --   --   HGB 11.0* 11.2* 9.9* 10.7*  HCT 34.7* 33.0* 31.8* 34.3*  MCV 98.0  --  97.2 99.7  PLT 232  --  174 161   Basic Metabolic Panel:  Recent Labs Lab 01/29/17 1359 01/29/17 1447 01/30/17 0413 01/31/17 0426  NA 146* 148* 145 147*  K 5.4* 4.3 4.2 3.9  CL 106 110 109 110  CO2  --  29 29 29   GLUCOSE 45* 59* 157* 90  BUN 48* 33* 30* 27*  CREATININE 2.00* 2.20* 1.97* 2.21*  CALCIUM  --  8.6* 8.1* 7.9*   GFR: Estimated Creatinine Clearance: 21.9 mL/min (A) (by C-G formula based on SCr of 2.21 mg/dL (H)). Liver Function Tests: No results for input(s): AST, ALT, ALKPHOS, BILITOT, PROT, ALBUMIN in the last 168 hours. No results for input(s): LIPASE, AMYLASE in the last 168 hours. No results for input(s): AMMONIA in the last 168 hours. Coagulation Profile: No results for input(s): INR, PROTIME in the last 168 hours. Cardiac Enzymes: No results for input(s): CKTOTAL, CKMB, CKMBINDEX, TROPONINI in the last 168 hours. BNP (last 3 results) No results for input(s): PROBNP in the last 8760 hours. HbA1C: No results for input(s): HGBA1C in the last 72 hours. CBG:  Recent Labs Lab 01/30/17 1357 01/30/17 1710 01/30/17 2128 01/31/17 0826 01/31/17 1220  GLUCAP 146* 124* 108* 88 104*   Lipid Profile: No results for input(s): CHOL, HDL, LDLCALC, TRIG, CHOLHDL, LDLDIRECT in the last 72 hours. Thyroid Function Tests: No results for input(s): TSH, T4TOTAL, FREET4, T3FREE, THYROIDAB in the last 72 hours. Anemia Panel: No results for input(s): VITAMINB12, FOLATE, FERRITIN, TIBC, IRON, RETICCTPCT in the last 72 hours. Urine analysis:    Component Value Date/Time   COLORURINE YELLOW 01/31/2017 0035   APPEARANCEUR CLOUDY (A) 01/31/2017 0035   LABSPEC 1.014 01/31/2017 0035   PHURINE 5.0 01/31/2017 0035   GLUCOSEU NEGATIVE 01/31/2017 0035   HGBUR LARGE (A) 01/31/2017 0035   BILIRUBINUR NEGATIVE 01/31/2017 0035    KETONESUR NEGATIVE 01/31/2017 0035   PROTEINUR 30 (A) 01/31/2017 0035   UROBILINOGEN 1.0 10/25/2013 2305   NITRITE NEGATIVE 01/31/2017 0035   LEUKOCYTESUR SMALL (A) 01/31/2017 0035   Sepsis Labs: @LABRCNTIP (procalcitonin:4,lacticidven:4)  ) Recent Results (from the past 240 hour(s))  Culture, blood (routine x 2)     Status: None (Preliminary result)   Collection Time: 01/29/17  4:24 PM  Result Value Ref Range Status   Specimen Description BLOOD BLOOD LEFT FOREARM  Final   Special Requests   Final    BOTTLES DRAWN AEROBIC AND ANAEROBIC Blood Culture adequate volume   Culture   Final    NO GROWTH 2 DAYS Performed at [facility], [place] [address], [place], [place] [address]    Report Status PENDING  Incomplete  Culture, blood (routine x 2)     Status: None (Preliminary result)   Collection Time: 01/29/17  4:44 PM  Result Value Ref Range Status   Specimen Description BLOOD  RIGHT ARM  Final   Special Requests IN PEDIATRIC BOTTLE Blood Culture adequate volume  Final   Culture   Final    NO GROWTH 2 DAYS Performed at [facility], [address] [place], [place] [address]    Report Status PENDING  Incomplete  MRSA PCR Screening     Status: Abnormal   Collection Time: 01/29/17  7:58 PM  Result Value Ref Range Status   MRSA by PCR POSITIVE (A) NEGATIVE Final    Comment:        The GeneXpert MRSA Assay (FDA approved for NASAL specimens only), is one component of a comprehensive MRSA colonization surveillance program. It is not intended to diagnose MRSA infection nor to guide or monitor treatment for MRSA infections. RESULT CALLED TO, READ BACK BY AND VERIFIED WITH: BELMA CERIC,RN [id number] @ [address]          Radiology Studies: Dg Chest 2 View  Result Date: 01/31/2017 CLINICAL DATA:  Shortness of breath, weakness, altered mental status, history of skin cancer, coronary artery disease post MI, Parkinson's dementia, hypertension, diabetes mellitus, COPD  EXAM: CHEST  2 VIEW COMPARISON:  01/29/2017 FINDINGS: Normal heart size post CABG and coronary stenting. Atherosclerotic calcification aorta. Subtotal opacification of LEFT hemithorax by increased LEFT pleural effusion. Subtotal atelectasis of LEFT lung. Mild RIGHT basilar atelectasis. No pulmonary infiltrate or pneumothorax. Diffuse osseous demineralization. IMPRESSION: Increased LEFT pleural effusion with subtotal atelectasis of LEFT lung. Mild RIGHT basilar atelectasis. Post CABG and coronary stenting. Aortic Atherosclerosis (ICD10-I70.0). Electronically Signed   By: Lavonia Dana M.D.   On: 01/31/2017 09:16   Dg Chest 2 View  Result Date: 01/29/2017 CLINICAL DATA:  Shortness of breath for 1 day. EXAM: CHEST  2 VIEW COMPARISON:  Chest x-ray dated June 30, 2016. FINDINGS: Postsurgical changes related to prior CABG. The cardiomediastinal silhouette is normal in size. Atherosclerotic calcification of the aortic arch. Low lung volumes are present, causing crowding of the pulmonary vasculature. Large left pleural effusion with left lower lobe consolidation. The right lung is clear. No pneumothorax. No acute osseous abnormality. Old right acromioclavicular separation. IMPRESSION: Large left pleural effusion with left lower lobe consolidation, which could represent pneumonia or atelectasis. Consider further evaluation with chest CT as clinically indicated. Electronically Signed   By: Titus Dubin M.D.   On: 01/29/2017 14:39        Scheduled Meds: . aspirin EC  81 mg Oral BID  . Chlorhexidine Gluconate Cloth  6 each Topical Q0600  . clopidogrel  75 mg Oral Q breakfast  . donepezil  5 mg Oral QHS  . DULoxetine  20 mg Oral Q breakfast  . famotidine  40 mg Oral QHS  . fludrocortisone  0.1 mg Oral Q breakfast  . heparin  5,000 Units Subcutaneous Q8H  . insulin glargine  12 Units Subcutaneous QHS  . memantine  28 mg Oral Q breakfast  . metoprolol succinate  25 mg Oral Q breakfast  . mupirocin ointment   1 application Nasal BID  . nystatin  5 mL Oral TID WC & HS  . pantoprazole  40 mg Oral BID  . polyvinyl alcohol  1 drop Both Eyes TID WC  . pravastatin  40 mg Oral Q breakfast  . umeclidinium-vilanterol  1 puff Inhalation Daily   Continuous Infusions: . dextrose 5 % and 0.45% NaCl 50 mL/hr at 01/31/17 0212  . piperacillin-tazobactam (ZOSYN)  IV Stopped (01/31/17 2094)     LOS: 2 days  Time spent: 45min    Domenic Polite, MD [facility] Pager [phone]  If 7PM-7AM, please contact night-coverage [email or web address] Password [email or web address] 01/31/2017, 1:18 PM

## 2017-01-31 NOTE — Progress Notes (Signed)
Paged on call MD M. Lynch regarding pt's urinary output. Pt had 100cc urine output during a 12/hr period. Bladder scan resulted in a volume of 106. Will continue to monitor. [place]

## 2017-01-31 NOTE — Progress Notes (Signed)
Paged on call MD M. Lynch regarding pt low b/p (89/70, 94/53 'automatic' and 80/50 'manual'). Order given for 500cc bolus. Conception Oms

## 2017-01-31 NOTE — Progress Notes (Signed)
Daily Progress Note   Patient Name: Leon Sharp       Date: 01/31/2017 DOB: [birth year]  Age: 83 y.o. MRN#: [id number] Attending Physician: Domenic Polite, MD Primary Care Physician: Jefm Petty, MD Admit Date: 01/29/2017  Reason for Consultation/Follow-up: Establishing goals of care  Subjective:  resting in bed, daughter states patient is about the same He has remained in bed, mostly not awake, he has not aroused enough for speech to attempt a bedside eval, remains NPO  Overnight events noted and discussed with daughter, patient had low BP and low urine output, received IVF bolus overnight.   Patient coughs spontaneously, otherwise, does not open eyes, does not talk to me, even when his name is called loudly  Daughter and son in law are at the bedside, son is arriving from [facility].  See below:   Length of Stay: 2  Current Medications: Scheduled Meds:  . aspirin EC  81 mg Oral BID  . Chlorhexidine Gluconate Cloth  6 each Topical Q0600  . clopidogrel  75 mg Oral Q breakfast  . donepezil  5 mg Oral QHS  . DULoxetine  20 mg Oral Q breakfast  . famotidine  40 mg Oral QHS  . fludrocortisone  0.1 mg Oral Q breakfast  . heparin  5,000 Units Subcutaneous Q8H  . insulin glargine  12 Units Subcutaneous QHS  . memantine  28 mg Oral Q breakfast  . metoprolol succinate  25 mg Oral Q breakfast  . mupirocin ointment  1 application Nasal BID  . nystatin  5 mL Oral TID WC & HS  . pantoprazole  40 mg Oral BID  . polyvinyl alcohol  1 drop Both Eyes TID WC  . pravastatin  40 mg Oral Q breakfast  . umeclidinium-vilanterol  1 puff Inhalation Daily    Continuous Infusions: . dextrose 5 % and 0.45% NaCl 50 mL/hr at 01/31/17 0212  . piperacillin-tazobactam (ZOSYN)  IV Stopped  (01/31/17 8309)    PRN Meds: acetaminophen, ondansetron **OR** ondansetron (ZOFRAN) IV  Physical Exam         Weak frail elderly person Resting in bed Shallow regular breath sounds Diminished L side S1 S2 Abdomen soft Has thin extremities Not alert/not awake  Vital Signs: BP 121/66 (BP Location: Right Arm)   Pulse 67   Temp 97.7  F (36.5 C) (Axillary)   Resp 20   Ht 5\' 10"  (1.778 m)   Wt 72.6 kg (160 lb)   SpO2 100%   BMI 22.96 kg/m  SpO2: SpO2: 100 % O2 Device: O2 Device: Not Delivered O2 Flow Rate: O2 Flow Rate (L/min): 2.5 L/min  Intake/output summary:  Intake/Output Summary (Last 24 hours) at 01/31/17 1131 Last data filed at 01/31/17 0900  Gross per 24 hour  Intake             1000 ml  Output              350 ml  Net              650 ml   LBM: Last BM Date: 01/28/17 Baseline Weight: Weight: 72.6 kg (160 lb) Most recent weight: Weight: 72.6 kg (160 lb)       Palliative Assessment/Data:    Flowsheet Rows     Most Recent Value  Intake Tab  Referral Department  Hospitalist  Unit at Time of Referral  Med/Surg Unit  Palliative Care Primary Diagnosis  Other (Comment) [dementia, dysphagia, asp pna, para pneumonic effusion. ]  Palliative Care Type  New Palliative care  Reason for referral  Clarify Goals of Care, Counsel Regarding Hospice  Date first seen by Palliative Care  01/30/17  Clinical Assessment  Palliative Performance Scale Score  10%  Pain Max last 24 hours  4  Pain Min Last 24 hours  3  Dyspnea Max Last 24 Hours  3  Dyspnea Min Last 24 hours  2  Nausea Max Last 24 Hours  3  Nausea Min Last 24 Hours  2  Anxiety Max Last 24 Hours  3  Anxiety Min Last 24 Hours  2  Psychosocial & Spiritual Assessment  Palliative Care Outcomes  Patient/Family meeting held?  Yes  Who was at the meeting?  [place] daughter, patient was not very responsive.   Palliative Care Outcomes  Clarified goals of care      Patient Active Problem List   Diagnosis  Date Noted  . HCAP (healthcare-associated pneumonia)   . Encounter for palliative care   . Goals of care, counseling/discussion   . Aspiration pneumonia ([place]) 01/29/2017  . Pleural effusion 01/29/2017  . Dysphagia 01/29/2017  . Dementia 01/29/2017  . COPD with emphysema ([place]) 01/29/2017  . CAD in native artery 01/29/2017  . AKI (acute kidney injury) ([place]) 01/29/2017  . Leg wound, right 12/05/2015  . Fall 05/17/2011  . Subdural hematoma ([place]) 05/15/2011  . Diabetes mellitus ([place]) 05/15/2011  . Hypertension 05/15/2011  . Syncope 05/15/2011    Palliative Care Assessment & Plan   Patient Profile:    Assessment: Dementia, wheel chair bound at baseline.  Severe dysphagia PNA: asp Worsening AKI Underlying at least stage III CKD  underlying COPD, DM  Recommendations/Plan:   goals of care discussions again held with daughter, she asked about CXR results, we discussed about CXR results showing ongoing L pleural effusion, likely worsening effusion. Daughter asking if removal of pleural fluid will help his condition overall. Will discuss with Dr [facility] MD.   Continue current mode of care, daughter is tearful and fully aware that should the patient have ongoing lack of improvement, if his PNA/pleural effusion continues to worsen, if he remains with worsening renal fxn, if he has ongoing nil Po intake, then we are looking at residential hospice on d/c. We will follow his hospital course and help guide appropriate decision  making.     Code Status:    Code Status Orders        Start     Ordered   01/29/17 1948  Do not attempt resuscitation (DNR)  Continuous    Question Answer Comment  In the event of cardiac or respiratory ARREST Do not call a "code blue"   In the event of cardiac or respiratory ARREST Do not perform Intubation, CPR, defibrillation or ACLS   In the event of cardiac or respiratory ARREST Use medication by any route, position, wound care, and other measures to  relive pain and suffering. May use oxygen, suction and manual treatment of airway obstruction as needed for comfort.      01/29/17 1947    Code Status History    Date Active Date Inactive Code Status Order ID Comments User Context   05/15/2011 11:37 PM 05/19/2011  8:03 PM Full Code [id number]  Rise Patience, MD Inpatient    Advance Directive Documentation     Most Recent Value  Type of Advance Directive  Healthcare Power of Attorney, Living will  Pre-existing out of facility DNR order (yellow form or pink MOST form)  -  "MOST" Form in Place?  -       Prognosis:   guarded   Discharge Planning:  To Be Determined  Care plan was discussed with  Daughter, son in law.   Thank you for allowing the Palliative Medicine Team to assist in the care of this patient.   Time In: 11 Time Out: 11.35 Total Time 35 Prolonged Time Billed  no       Greater than 50%  of this time was spent counseling and coordinating care related to the above assessment and plan.  Loistine Chance, MD [phone]  Please contact Palliative Medicine Team phone at [phone] for questions and concerns.

## 2017-02-01 ENCOUNTER — Inpatient Hospital Stay: Payer: Self-pay

## 2017-02-01 ENCOUNTER — Inpatient Hospital Stay: Payer: Medicare Other

## 2017-02-01 DIAGNOSIS — Z7189 Other specified counseling: Secondary | ICD-10-CM

## 2017-02-01 LAB — CBC
HCT: 33.3 % — ABNORMAL LOW (ref 39.0–52.0)
HEMOGLOBIN: 10.3 g/dL — AB (ref 13.0–17.0)
MCH: 31.1 pg (ref 26.0–34.0)
MCHC: 30.9 g/dL (ref 30.0–36.0)
MCV: 100.6 fL — ABNORMAL HIGH (ref 78.0–100.0)
Platelets: 173 10*3/uL (ref 150–400)
RBC: 3.31 MIL/uL — ABNORMAL LOW (ref 4.22–5.81)
RDW: 15.3 % (ref 11.5–15.5)
WBC: 7.4 10*3/uL (ref 4.0–10.5)

## 2017-02-01 LAB — BASIC METABOLIC PANEL
Anion gap: 9 (ref 5–15)
BUN: 26 mg/dL — ABNORMAL HIGH (ref 6–20)
CALCIUM: 8 mg/dL — AB (ref 8.9–10.3)
CHLORIDE: 108 mmol/L (ref 101–111)
CO2: 29 mmol/L (ref 22–32)
CREATININE: 2.17 mg/dL — AB (ref 0.61–1.24)
GFR, EST AFRICAN AMERICAN: 29 mL/min — AB (ref >60)
GFR, EST NON AFRICAN AMERICAN: 25 mL/min — AB (ref >60)
Glucose, Bld: 135 mg/dL — ABNORMAL HIGH (ref 65–99)
Potassium: 3.7 mmol/L (ref 3.5–5.1)
SODIUM: 146 mmol/L — AB (ref 135–145)

## 2017-02-01 LAB — GLUCOSE, CAPILLARY
GLUCOSE-CAPILLARY: 107 mg/dL — AB (ref 65–99)
GLUCOSE-CAPILLARY: 119 mg/dL — AB (ref 65–99)
Glucose-Capillary: 131 mg/dL — ABNORMAL HIGH (ref 65–99)

## 2017-02-01 MED ORDER — RESOURCE THICKENUP CLEAR PO POWD
ORAL | Status: DC | PRN
  Filled 2017-02-01: qty 125

## 2017-02-01 NOTE — Progress Notes (Signed)
Daily Progress Note   Patient Name: Leon Sharp       Date: 02/01/2017 DOB: Aug 09, 1921  Age: 83 y.o. MRN#: [id number] Attending Physician: Domenic Polite, MD Primary Care Physician: Jefm Petty, MD Admit Date: 01/29/2017  Reason for Consultation/Follow-up: Establishing goals of care  Subjective:  resting in bed, daughter and son in law at bedside  Patient is much more awake and alert He responds appropriately to questions asked.   See below:   Length of Stay: 3  Current Medications: Scheduled Meds:  . aspirin EC  81 mg Oral BID  . Chlorhexidine Gluconate Cloth  6 each Topical Q0600  . clopidogrel  75 mg Oral Q breakfast  . donepezil  5 mg Oral QHS  . DULoxetine  20 mg Oral Q breakfast  . famotidine  20 mg Oral QHS  . fludrocortisone  0.1 mg Oral Q breakfast  . insulin glargine  12 Units Subcutaneous QHS  . memantine  28 mg Oral Q breakfast  . metoprolol succinate  25 mg Oral Q breakfast  . mupirocin ointment  1 application Nasal BID  . nystatin  5 mL Oral TID WC & HS  . pantoprazole  40 mg Oral BID  . polyvinyl alcohol  1 drop Both Eyes TID WC  . pravastatin  40 mg Oral Q breakfast  . umeclidinium-vilanterol  1 puff Inhalation Daily    Continuous Infusions: . dextrose 5 % and 0.45% NaCl 50 mL/hr at 01/31/17 2319  . piperacillin-tazobactam (ZOSYN)  IV Stopped (02/01/17 0555)    PRN Meds: acetaminophen, ondansetron **OR** ondansetron (ZOFRAN) IV, RESOURCE THICKENUP CLEAR  Physical Exam         Weak frail elderly person Resting in bed Shallow regular breath sounds Diminished L side S1 S2 Abdomen soft Has thin extremities Opens eyes, awake alert Answers appropriately this am.   Vital Signs: BP (!) 106/59 (BP Location: Left Arm)   Pulse 84   Temp 98 F  (36.7 C) (Oral)   Resp 18   Ht 5\' 10"  (1.778 m)   Wt 72.6 kg (160 lb)   SpO2 98%   BMI 22.96 kg/m  SpO2: SpO2: 98 % O2 Device: O2 Device: Nasal Cannula O2 Flow Rate: O2 Flow Rate (L/min): 2.5 L/min  Intake/output summary:   Intake/Output Summary (Last 24 hours) at 02/01/17  1137 Last data filed at 02/01/17 0551  Gross per 24 hour  Intake           1642.5 ml  Output                0 ml  Net           1642.5 ml   LBM: Last BM Date: 01/28/17 Baseline Weight: Weight: 72.6 kg (160 lb) Most recent weight: Weight: 72.6 kg (160 lb)       Palliative Assessment/Data:    Flowsheet Rows     Most Recent Value  Intake Tab  Referral Department  Hospitalist  Unit at Time of Referral  Med/Surg Unit  Palliative Care Primary Diagnosis  Other (Comment) [dementia, dysphagia, asp pna, para pneumonic effusion. ]  Palliative Care Type  New Palliative care  Reason for referral  Clarify Goals of Care, Counsel Regarding Hospice  Date first seen by Palliative Care  01/30/17  Clinical Assessment  Palliative Performance Scale Score  10%  Pain Max last 24 hours  4  Pain Min Last 24 hours  3  Dyspnea Max Last 24 Hours  3  Dyspnea Min Last 24 hours  2  Nausea Max Last 24 Hours  3  Nausea Min Last 24 Hours  2  Anxiety Max Last 24 Hours  3  Anxiety Min Last 24 Hours  2  Psychosocial & Spiritual Assessment  Palliative Care Outcomes  Patient/Family meeting held?  Yes  Who was at the meeting?  [place] daughter, patient was not very responsive.   Palliative Care Outcomes  Clarified goals of care      Patient Active Problem List   Diagnosis Date Noted  . HCAP (healthcare-associated pneumonia)   . Encounter for palliative care   . Goals of care, counseling/discussion   . Aspiration pneumonia ([place]) 01/29/2017  . Pleural effusion 01/29/2017  . Dysphagia 01/29/2017  . Dementia 01/29/2017  . COPD with emphysema ([place]) 01/29/2017  . CAD in native artery 01/29/2017  . AKI (acute kidney  injury) ([place]) 01/29/2017  . Leg wound, right 12/05/2015  . Fall 05/17/2011  . Subdural hematoma ([place]) 05/15/2011  . Diabetes mellitus ([place]) 05/15/2011  . Hypertension 05/15/2011  . Syncope 05/15/2011    Palliative Care Assessment & Plan   Patient Profile:    Assessment: Dementia, wheel chair bound at baseline.  Severe dysphagia PNA: asp Worsening AKI Underlying at least stage III CKD  underlying COPD, DM  Recommendations/Plan:   daughter considering thoracentesis of pleural effusion. She is awaiting her brother's arrival from [place] later this afternoon.   Recommend comfort feeds with precautions, no PEG has been decided by daughter and I agree.   continue to monitor PO intake, renal function, urine output, likely also needs drainage of pleural effusion, depending on family decision.     Code Status:    Code Status Orders        Start     Ordered   01/29/17 1948  Do not attempt resuscitation (DNR)  Continuous    Question Answer Comment  In the event of cardiac or respiratory ARREST Do not call a "code blue"   In the event of cardiac or respiratory ARREST Do not perform Intubation, CPR, defibrillation or ACLS   In the event of cardiac or respiratory ARREST Use medication by any route, position, wound care, and other measures to relive pain and suffering. May use oxygen, suction and manual treatment of airway obstruction as needed for comfort.  01/29/17 1947    Code Status History    Date Active Date Inactive Code Status Order ID Comments User Context   05/15/2011 11:37 PM 05/19/2011  8:03 PM Full Code [id number]  Rise Patience, MD Inpatient    Advance Directive Documentation     Most Recent Value  Type of Advance Directive  Healthcare Power of Attorney, Living will  Pre-existing out of facility DNR order (yellow form or pink MOST form)  -  "MOST" Form in Place?  -       Prognosis:   guarded   Discharge Planning:  To Be Determined  Care plan  was discussed with  Daughter, son in law.   Thank you for allowing the Palliative Medicine Team to assist in the care of this patient.   Time In: 10 Time Out: 10.35 Total Time 35 Prolonged Time Billed  no       Greater than 50%  of this time was spent counseling and coordinating care related to the above assessment and plan.  Loistine Chance, MD [phone]  Please contact Palliative Medicine Team phone at [phone] for questions and concerns.

## 2017-02-01 NOTE — Progress Notes (Signed)
PROGRESS NOTE    Leon Sharp  WFU:[id number] DOB: [birth year] DOA: 01/29/2017 PCP: Jefm Petty, MD  Brief Narrative:Leon Sharp is a 82 year old male with multiple medical problems most notable for dementia, debility/wheelchair-bound, COPD, CAD with numerous stents, severe dysphagia, history of congestive heart failure, chronic kidney disease stage III was diagnosed with aspiration pneumonia last Friday and was started on Augmentin then. He's had extensive workup for dysphagia, his most recent modified barium swallow was 2 weeks ago, where it was recommended to place a PEG tube, which was declined by his daughter and patient continued on a pured diet with honey thick liquids. He also had an endoscopy a week ago, he was noted to have a mild esophageal stricture which was dilated then. He was brought to the emergency room today due to increased shortness of breath, Workup in the ED notable for mild leukocytosis, hyponatremia, creatinine of 2.2, normal lactate, chest x-ray with left lower lobe pneumonia and moderate left pleural effusion  Assessment & Plan:  Aspiration pneumonia (HCC) with large L effusion -Long-standing history of severe dysphagia  -Repeat chest x-ray 8/11 with worsening large left likely parapneumonic effusion -Long-standing history of dysphagia, failed modified barium swallow 2 weeks ago, speech therapist following but unable to evaluate at this time due to depressed mentation -Appreciate palliative consult -more alert and lucid today, so should be able to attempt an SLP eval and PO intake hopefully -talked to daughter abt considering a Thoracentesis to treat this now, unless we plan to focus on comfort based care only, she wants to talk to her brother abt this -also talked to daughter abt considering Hospice at discharge despite current improvement since its only a matter of time before he aspirates again -overall prognosis remains poor due to advanced age,  dementia, bedbound state status, severe dysphagia, worsening kidney function, severe malnutrition  Severe dysphagia -As above, SLP following -attempt eval today with improvement in mentation  Alzheimer's dementia -unable to resume Namenda  AKI on CKD3 with mild hypernatremia -Baseline creatinine around 1.4-1.5Holding Celebrex, -creatinine briefly improved yesterday and then worsened again along with soft blood pressures and low urine output last night -stable, monitor  DM -Stopped Lantus due low CBGs and worsening renal function  -SSI   COPD with emphysema ([place]) -Stable, nebs when necessary    CAD in native artery -Stable, resume metoprolol when able, Plavix and statin  DVT prophylaxis: Heparin subcutaneous  Code Status: DO NOT RESUSCITATE  Family Communication:  long discussion with daughter 72/10, 8/11 and today 8/12 Disposition Plan:  SNF with Hospice vs Residential Hospice   Consultants:   Palliative medicine  Antimicrobials:   IV Zosyn since 8/9   Subjective: Opens eyes, more lucid this am  Objective: Vitals:   01/31/17 0610 01/31/17 1500 01/31/17 2031 02/01/17 0550  BP: 121/66 117/71 128/66 (!) 106/59  Pulse: 67 84 81 84  Resp: 20 20 20 18   Temp: 97.7 F (36.5 C) 98.2 F (36.8 C) 97.7 F (36.5 C) 98 F (36.7 C)  TempSrc: Axillary Axillary Oral Oral  SpO2: 100% 100% 100% 98%  Weight:      Height:        Intake/Output Summary (Last 24 hours) at 02/01/17 1158 Last data filed at 02/01/17 0551  Gross per 24 hour  Intake           1642.5 ml  Output                0 ml  Net  1642.5 ml   Filed Weights   01/29/17 1304  Weight: 72.6 kg (160 lb)    Examination:  Gen: Frail elderly white male laying in bed, opens eyes, more lucid today, very cachectic HEENT: PERRLA, Neck supple, no JVD Lungs: Decreased breath sounds at the left CVS: RRR,No Gallops,Rubs or new Murmurs Abd: soft, Non tender, non distended, BS present Extremities: No  Cyanosis, Clubbing or edema Skin: no new rashes Neurology: Higher functions improving, follows commands, speech more comprehensible today, moves all extremities no localizing signs  Data Reviewed:   CBC:  Recent Labs Lab 01/29/17 1322 01/29/17 1359 01/30/17 0413 01/31/17 0426 02/01/17 0421  WBC 12.7*  --  7.7 7.5 7.4  NEUTROABS 9.2*  --   --   --   --   HGB 11.0* 11.2* 9.9* 10.7* 10.3*  HCT 34.7* 33.0* 31.8* 34.3* 33.3*  MCV 98.0  --  97.2 99.7 100.6*  PLT 232  --  174 168 191   Basic Metabolic Panel:  Recent Labs Lab 01/29/17 1359 01/29/17 1447 01/30/17 0413 01/31/17 0426 02/01/17 0421  NA 146* 148* 145 147* 146*  K 5.4* 4.3 4.2 3.9 3.7  CL 106 110 109 110 108  CO2  --  29 29 29 29   GLUCOSE 45* 59* 157* 90 135*  BUN 48* 33* 30* 27* 26*  CREATININE 2.00* 2.20* 1.97* 2.21* 2.17*  CALCIUM  --  8.6* 8.1* 7.9* 8.0*   GFR: Estimated Creatinine Clearance: 22.3 mL/min (A) (by C-G formula based on SCr of 2.17 mg/dL (H)). Liver Function Tests: No results for input(s): AST, ALT, ALKPHOS, BILITOT, PROT, ALBUMIN in the last 168 hours. No results for input(s): LIPASE, AMYLASE in the last 168 hours. No results for input(s): AMMONIA in the last 168 hours. Coagulation Profile: No results for input(s): INR, PROTIME in the last 168 hours. Cardiac Enzymes: No results for input(s): CKTOTAL, CKMB, CKMBINDEX, TROPONINI in the last 168 hours. BNP (last 3 results) No results for input(s): PROBNP in the last 8760 hours. HbA1C: No results for input(s): HGBA1C in the last 72 hours. CBG:  Recent Labs Lab 01/31/17 1220 01/31/17 1622 01/31/17 2111 02/01/17 0722 02/01/17 1143  GLUCAP 104* 120* 169* 107* 119*   Lipid Profile: No results for input(s): CHOL, HDL, LDLCALC, TRIG, CHOLHDL, LDLDIRECT in the last 72 hours. Thyroid Function Tests: No results for input(s): TSH, T4TOTAL, FREET4, T3FREE, THYROIDAB in the last 72 hours. Anemia Panel: No results for input(s): VITAMINB12,  FOLATE, FERRITIN, TIBC, IRON, RETICCTPCT in the last 72 hours. Urine analysis:    Component Value Date/Time   COLORURINE YELLOW 01/31/2017 0035   APPEARANCEUR CLOUDY (A) 01/31/2017 0035   LABSPEC 1.014 01/31/2017 0035   PHURINE 5.0 01/31/2017 0035   GLUCOSEU NEGATIVE 01/31/2017 0035   HGBUR LARGE (A) 01/31/2017 0035   BILIRUBINUR NEGATIVE 01/31/2017 0035   KETONESUR NEGATIVE 01/31/2017 0035   PROTEINUR 30 (A) 01/31/2017 0035   UROBILINOGEN 1.0 10/25/2013 2305   NITRITE NEGATIVE 01/31/2017 0035   LEUKOCYTESUR SMALL (A) 01/31/2017 0035   Sepsis Labs: @LABRCNTIP (procalcitonin:4,lacticidven:4)  ) Recent Results (from the past 240 hour(s))  Culture, blood (routine x 2)     Status: None (Preliminary result)   Collection Time: 01/29/17  4:24 PM  Result Value Ref Range Status   Specimen Description BLOOD BLOOD LEFT FOREARM  Final   Special Requests   Final    BOTTLES DRAWN AEROBIC AND ANAEROBIC Blood Culture adequate volume   Culture   Final    NO GROWTH 3 DAYS  Performed at [facility], [place] [address] [place], [place] [address]    Report Status PENDING  Incomplete  Culture, blood (routine x 2)     Status: None (Preliminary result)   Collection Time: 01/29/17  4:44 PM  Result Value Ref Range Status   Specimen Description BLOOD RIGHT ARM  Final   Special Requests IN PEDIATRIC BOTTLE Blood Culture adequate volume  Final   Culture   Final    NO GROWTH 3 DAYS Performed at [facility], [place] [address], [place], [place] [address]    Report Status PENDING  Incomplete  MRSA PCR Screening     Status: Abnormal   Collection Time: 01/29/17  7:58 PM  Result Value Ref Range Status   MRSA by PCR POSITIVE (A) NEGATIVE Final    Comment:        The GeneXpert MRSA Assay (FDA approved for NASAL specimens only), is one component of a comprehensive MRSA colonization surveillance program. It is not intended to diagnose MRSA infection nor to guide or monitor treatment for MRSA  infections. RESULT CALLED TO, READ BACK BY AND VERIFIED WITH: BELMA CERIC,RN [id number] @ [address]          Radiology Studies: Dg Chest 2 View  Result Date: 01/31/2017 CLINICAL DATA:  Shortness of breath, weakness, altered mental status, history of skin cancer, coronary artery disease post MI, Parkinson's dementia, hypertension, diabetes mellitus, COPD EXAM: CHEST  2 VIEW COMPARISON:  01/29/2017 FINDINGS: Normal heart size post CABG and coronary stenting. Atherosclerotic calcification aorta. Subtotal opacification of LEFT hemithorax by increased LEFT pleural effusion. Subtotal atelectasis of LEFT lung. Mild RIGHT basilar atelectasis. No pulmonary infiltrate or pneumothorax. Diffuse osseous demineralization. IMPRESSION: Increased LEFT pleural effusion with subtotal atelectasis of LEFT lung. Mild RIGHT basilar atelectasis. Post CABG and coronary stenting. Aortic Atherosclerosis (ICD10-I70.0). Electronically Signed   By: Lavonia Dana M.D.   On: 01/31/2017 09:16        Scheduled Meds: . aspirin EC  81 mg Oral BID  . Chlorhexidine Gluconate Cloth  6 each Topical Q0600  . clopidogrel  75 mg Oral Q breakfast  . donepezil  5 mg Oral QHS  . DULoxetine  20 mg Oral Q breakfast  . famotidine  20 mg Oral QHS  . fludrocortisone  0.1 mg Oral Q breakfast  . insulin glargine  12 Units Subcutaneous QHS  . memantine  28 mg Oral Q breakfast  . metoprolol succinate  25 mg Oral Q breakfast  . mupirocin ointment  1 application Nasal BID  . nystatin  5 mL Oral TID WC & HS  . pantoprazole  40 mg Oral BID  . polyvinyl alcohol  1 drop Both Eyes TID WC  . pravastatin  40 mg Oral Q breakfast  . umeclidinium-vilanterol  1 puff Inhalation Daily   Continuous Infusions: . dextrose 5 % and 0.45% NaCl 50 mL/hr at 01/31/17 2319  . piperacillin-tazobactam (ZOSYN)  IV Stopped (02/01/17 0555)     LOS: 3 days    Time spent: 63min    Domenic Polite, MD [facility] Pager [phone]  If  7PM-7AM, please contact night-coverage [email or web address] Password [facility] 02/01/2017, 11:58 AM

## 2017-02-01 NOTE — Progress Notes (Signed)
Pharmacy Antibiotic Note  Leon Sharp is a 83 y.o. male admitted on 01/29/2017 with aspiration pneumonia with large pleural effusion.  Pharmacy has been consulted for zosyn dosing.  Plan:  Continue Zosyn 2.25gm IV q6h over 26min infusion based on age and borderline CrCl for dose adjustment  Follow renal fx  Follow ability to narrow  Height: 5\' 10"  (177.8 cm) Weight: 160 lb (72.6 kg) IBW/kg (Calculated) : 73  Temp (24hrs), Avg:98 F (36.7 C), Min:97.7 F (36.5 C), Max:98.2 F (36.8 C)   Recent Labs Lab 01/29/17 1322 01/29/17 1359 01/29/17 1447 01/29/17 1635 01/30/17 0413 01/31/17 0426 02/01/17 0421  WBC 12.7*  --   --   --  7.7 7.5 7.4  CREATININE  --  2.00* 2.20*  --  1.97* 2.21* 2.17*  LATICACIDVEN  --   --   --  1.43  --   --   --     Estimated Creatinine Clearance: 22.3 mL/min (A) (by C-G formula based on SCr of 2.17 mg/dL (H)).    No Known Allergies  Antimicrobials this admission: 8/9 zosyn >>  Dose adjustments this admission:   Microbiology results: 8/9 BCx:   Thank you for allowing pharmacy to be a part of this patient's care.  Hershal Coria, PharmD, BCPS Pager: [phone] 02/01/2017 2:06 PM

## 2017-02-02 ENCOUNTER — Inpatient Hospital Stay (HOSPITAL_COMMUNITY): Payer: Medicare Other

## 2017-02-02 DIAGNOSIS — J189 Pneumonia, unspecified organism: Secondary | ICD-10-CM

## 2017-02-02 LAB — BASIC METABOLIC PANEL
Anion gap: 7 (ref 5–15)
BUN: 27 mg/dL — ABNORMAL HIGH (ref 6–20)
CALCIUM: 8 mg/dL — AB (ref 8.9–10.3)
CO2: 29 mmol/L (ref 22–32)
CREATININE: 2.09 mg/dL — AB (ref 0.61–1.24)
Chloride: 112 mmol/L — ABNORMAL HIGH (ref 101–111)
GFR calc non Af Amer: 26 mL/min — ABNORMAL LOW (ref >60)
GFR, EST AFRICAN AMERICAN: 30 mL/min — AB (ref >60)
Glucose, Bld: 103 mg/dL — ABNORMAL HIGH (ref 65–99)
Potassium: 3.8 mmol/L (ref 3.5–5.1)
SODIUM: 148 mmol/L — AB (ref 135–145)

## 2017-02-02 LAB — CBC
HCT: 31.7 % — ABNORMAL LOW (ref 39.0–52.0)
Hemoglobin: 9.7 g/dL — ABNORMAL LOW (ref 13.0–17.0)
MCH: 30.2 pg (ref 26.0–34.0)
MCHC: 30.6 g/dL (ref 30.0–36.0)
MCV: 98.8 fL (ref 78.0–100.0)
Platelets: 163 10*3/uL (ref 150–400)
RBC: 3.21 MIL/uL — ABNORMAL LOW (ref 4.22–5.81)
RDW: 15.2 % (ref 11.5–15.5)
WBC: 7.5 10*3/uL (ref 4.0–10.5)

## 2017-02-02 LAB — LACTATE DEHYDROGENASE, PLEURAL OR PERITONEAL FLUID: LD, Fluid: 269 U/L — ABNORMAL HIGH (ref 3–23)

## 2017-02-02 LAB — PROTEIN, PLEURAL OR PERITONEAL FLUID

## 2017-02-02 LAB — BODY FLUID CELL COUNT WITH DIFFERENTIAL
Eos, Fluid: 2 %
LYMPHS FL: 50 %
Monocyte-Macrophage-Serous Fluid: 19 % — ABNORMAL LOW (ref 50–90)
NEUTROPHIL FLUID: 29 % — AB (ref 0–25)
WBC FLUID: 1070 uL — AB (ref 0–1000)

## 2017-02-02 LAB — GLUCOSE, CAPILLARY
GLUCOSE-CAPILLARY: 91 mg/dL (ref 65–99)
GLUCOSE-CAPILLARY: 168 mg/dL — AB (ref 65–99)
GLUCOSE-CAPILLARY: 163 mg/dL — AB (ref 65–99)
Glucose-Capillary: 191 mg/dL — ABNORMAL HIGH (ref 65–99)

## 2017-02-02 MED ORDER — LIDOCAINE HCL 1 % IJ SOLN
INTRAMUSCULAR | Status: AC
  Filled 2017-02-02: qty 20

## 2017-02-02 NOTE — Procedures (Signed)
Ultrasound-guided diagnostic and therapeutic left  thoracentesis performed yielding 1.5 liters of bloody fluid. Since this was pt's initial thoracentesis only the above amount of fluid was removed today. No immediate complications. Follow-up chest x-ray pending.The fluid was sent to the lab for preordered studies. Dr. Broadus John was notified of above findings.

## 2017-02-02 NOTE — Progress Notes (Signed)
PROGRESS NOTE    Leon Sharp  VOJ:[id number] DOB: [birth year] DOA: 01/29/2017 PCP: Jefm Petty, MD  Brief Narrative:Leon Sharp is a 83 year old male with multiple medical problems most notable for dementia, debility/wheelchair-bound, COPD, CAD with numerous stents, severe dysphagia, history of congestive heart failure, chronic kidney disease stage III was diagnosed with aspiration pneumonia last Friday and was started on Augmentin then. He's had extensive workup for dysphagia, his most recent modified barium swallow was 2 weeks ago, where it was recommended to place a PEG tube, which was declined by his daughter and patient continued on a pured diet with honey thick liquids. He also had an endoscopy a week ago, he was noted to have a mild esophageal stricture which was dilated then. He was brought to the emergency room today due to increased shortness of breath, Workup in the ED notable for mild leukocytosis, hyponatremia, creatinine of 2.2, normal lactate, chest x-ray with left lower lobe pneumonia and moderate left pleural effusion  Assessment & Plan:  Aspiration pneumonia (HCC) with large L effusion -Long-standing history of severe dysphagia  -Repeat chest x-ray 8/11 with worsening large left likely parapneumonic effusion -Long-standing history of dysphagia, failed modified barium swallow 2 weeks ago, speech therapist following but were unable to evaluate him due to depressed mentation -Appreciate palliative consult -more alert and lucid since yesterday so should be able to attempt an SLP eval , some Po intake since yesterday, due to large L pleural effusion, will go ahead with Thoracentesis for Palliative purposes. -also talked to daughter abt considering Hospice at discharge despite current improvement since its only a matter of time before he aspirates again, she is agreeable to Hospice at discharge -overall prognosis remains poor due to advanced age, dementia, bedbound state  status, severe dysphagia, worsening kidney function, severe malnutrition  Severe dysphagia -As above, SLP following -hopefully can have SLP eval today with improvement in mentation  Alzheimer's dementia -unable to resume Namenda   AKI on CKD3 with mild hypernatremia -Baseline creatinine around 1.4-1.5Holding Celebrex, -creatinine briefly improved yesterday and then worsened again along with soft blood pressures and low urine output last night -stable, monitor, stop IVF today   DM -Stopped Lantus due low CBGs and worsening renal function  -SSI   COPD with emphysema (HCC) -Stable, nebs when necessary    CAD in native artery -Stable, continue metoprolol, Plavix and statin  DVT prophylaxis: Heparin subcutaneous  Code Status: DO NOT RESUSCITATE  Family Communication:  long discussion with daughter 63/10, 8/11, 8/12 and 8/13 Disposition Plan:  SNF with Hospice vs Residential Hospice   Consultants:   Palliative medicine  Antimicrobials:   IV Zosyn since 8/9   Subjective: Remains more lucid like yesterday, able to tolerate some Po yestedray   Objective: Vitals:   02/01/17 1700 02/01/17 2106 02/01/17 2124 02/02/17 0601  BP: (!) 116/59 124/68 118/67 115/64  Pulse: 80 94 94 97  Resp: 18 18 19 18   Temp: 98 F (36.7 C) 98 F (36.7 C) 98.8 F (37.1 C) (!) 97.4 F (36.3 C)  TempSrc: Oral Oral Oral Oral  SpO2: 98% 100% 96% 100%  Weight:      Height:        Intake/Output Summary (Last 24 hours) at 02/02/17 1245 Last data filed at 02/02/17 1000  Gross per 24 hour  Intake              400 ml  Output  100 ml  Net              300 ml   Filed Weights   01/29/17 1304  Weight: 72.6 kg (160 lb)    Examination:  Gen: Frail, cachectic elderly white male laying in bed eyes open, more interactive, HEENT: PERRLA, Neck supple, no JVD Lungs: Decreased breath sounds left CVS: RRR,No Gallops,Rubs or new Murmurs Abd: soft, Non tender, non distended, BS  present Extremities: No Cyanosis, Clubbing or edema Skin: no new rashes Neuro: moves all extremities, no localizing signs  Data Reviewed:   CBC:  Recent Labs Lab 01/29/17 1322 01/29/17 1359 01/30/17 0413 01/31/17 0426 02/01/17 0421 02/02/17 0435  WBC 12.7*  --  7.7 7.5 7.4 7.5  NEUTROABS 9.2*  --   --   --   --   --   HGB 11.0* 11.2* 9.9* 10.7* 10.3* 9.7*  HCT 34.7* 33.0* 31.8* 34.3* 33.3* 31.7*  MCV 98.0  --  97.2 99.7 100.6* 98.8  PLT 232  --  174 168 173 875   Basic Metabolic Panel:  Recent Labs Lab 01/29/17 1447 01/30/17 0413 01/31/17 0426 02/01/17 0421 02/02/17 0435  NA 148* 145 147* 146* 148*  K 4.3 4.2 3.9 3.7 3.8  CL 110 109 110 108 112*  CO2 29 29 29 29 29   GLUCOSE 59* 157* 90 135* 103*  BUN 33* 30* 27* 26* 27*  CREATININE 2.20* 1.97* 2.21* 2.17* 2.09*  CALCIUM 8.6* 8.1* 7.9* 8.0* 8.0*   GFR: Estimated Creatinine Clearance: 23.2 mL/min (A) (by C-G formula based on SCr of 2.09 mg/dL (H)). Liver Function Tests: No results for input(s): AST, ALT, ALKPHOS, BILITOT, PROT, ALBUMIN in the last 168 hours. No results for input(s): LIPASE, AMYLASE in the last 168 hours. No results for input(s): AMMONIA in the last 168 hours. Coagulation Profile: No results for input(s): INR, PROTIME in the last 168 hours. Cardiac Enzymes: No results for input(s): CKTOTAL, CKMB, CKMBINDEX, TROPONINI in the last 168 hours. BNP (last 3 results) No results for input(s): PROBNP in the last 8760 hours. HbA1C: No results for input(s): HGBA1C in the last 72 hours. CBG:  Recent Labs Lab 02/01/17 0722 02/01/17 1143 02/01/17 2136 02/02/17 0808 02/02/17 1204  GLUCAP 107* 119* 131* 91 168*   Lipid Profile: No results for input(s): CHOL, HDL, LDLCALC, TRIG, CHOLHDL, LDLDIRECT in the last 72 hours. Thyroid Function Tests: No results for input(s): TSH, T4TOTAL, FREET4, T3FREE, THYROIDAB in the last 72 hours. Anemia Panel: No results for input(s): VITAMINB12, FOLATE, FERRITIN,  TIBC, IRON, RETICCTPCT in the last 72 hours. Urine analysis:    Component Value Date/Time   COLORURINE YELLOW 01/31/2017 0035   APPEARANCEUR CLOUDY (A) 01/31/2017 0035   LABSPEC 1.014 01/31/2017 0035   PHURINE 5.0 01/31/2017 0035   GLUCOSEU NEGATIVE 01/31/2017 0035   HGBUR LARGE (A) 01/31/2017 0035   BILIRUBINUR NEGATIVE 01/31/2017 0035   KETONESUR NEGATIVE 01/31/2017 0035   PROTEINUR 30 (A) 01/31/2017 0035   UROBILINOGEN 1.0 10/25/2013 2305   NITRITE NEGATIVE 01/31/2017 0035   LEUKOCYTESUR SMALL (A) 01/31/2017 0035   Sepsis Labs: @LABRCNTIP (procalcitonin:4,lacticidven:4)  ) Recent Results (from the past 240 hour(s))  Culture, blood (routine x 2)     Status: None (Preliminary result)   Collection Time: 01/29/17  4:24 PM  Result Value Ref Range Status   Specimen Description BLOOD BLOOD LEFT FOREARM  Final   Special Requests   Final    BOTTLES DRAWN AEROBIC AND ANAEROBIC Blood Culture adequate volume   Culture  Final    NO GROWTH 3 DAYS Performed at [facility], [place] [address], [place], [place] [address]    Report Status PENDING  Incomplete  Culture, blood (routine x 2)     Status: None (Preliminary result)   Collection Time: 01/29/17  4:44 PM  Result Value Ref Range Status   Specimen Description BLOOD RIGHT ARM  Final   Special Requests IN PEDIATRIC BOTTLE Blood Culture adequate volume  Final   Culture   Final    NO GROWTH 3 DAYS Performed at [facility], [place] [address], [place], [place] [address]    Report Status PENDING  Incomplete  MRSA PCR Screening     Status: Abnormal   Collection Time: 01/29/17  7:58 PM  Result Value Ref Range Status   MRSA by PCR POSITIVE (A) NEGATIVE Final    Comment:        The GeneXpert MRSA Assay (FDA approved for NASAL specimens only), is one component of a comprehensive MRSA colonization surveillance program. It is not intended to diagnose MRSA infection nor to guide or monitor treatment for MRSA infections. RESULT  CALLED TO, READ BACK BY AND VERIFIED WITH: BELMA CERIC,RN [id number] @ [place]          Radiology Studies: No results found.      Scheduled Meds: . aspirin EC  81 mg Oral BID  . Chlorhexidine Gluconate Cloth  6 each Topical Q0600  . clopidogrel  75 mg Oral Q breakfast  . donepezil  5 mg Oral QHS  . DULoxetine  20 mg Oral Q breakfast  . famotidine  20 mg Oral QHS  . fludrocortisone  0.1 mg Oral Q breakfast  . insulin glargine  12 Units Subcutaneous QHS  . memantine  28 mg Oral Q breakfast  . metoprolol succinate  25 mg Oral Q breakfast  . mupirocin ointment  1 application Nasal BID  . nystatin  5 mL Oral TID WC & HS  . pantoprazole  40 mg Oral BID  . polyvinyl alcohol  1 drop Both Eyes TID WC  . pravastatin  40 mg Oral Q breakfast  . umeclidinium-vilanterol  1 puff Inhalation Daily   Continuous Infusions: . piperacillin-tazobactam (ZOSYN)  IV 2.25 g (02/02/17 1232)     LOS: 4 days    Time spent: 64min    Domenic Polite, MD [facility] Pager [phone]  If 7PM-7AM, please contact night-coverage [email or web address] Password [facility] 02/02/2017, 12:45 PM

## 2017-02-02 NOTE — Care Management Important Message (Addendum)
Important Message  Patient Details IM Letter given to Cookie/Case Manager to present to Patient Name: JONH MCQUEARY MRN: [id number] Date of Birth: 12/14/1921   Medicare Important Message Given:  Yes    Kerin Salen 02/02/2017, 12:04 [facility] Message  Patient Details  Name: DE JAWORSKI MRN: [id number] Date of Birth: Aug 20, 1921   Medicare Important Message Given:  Yes    Kerin Salen 02/02/2017, 12:04 PM

## 2017-02-02 NOTE — Progress Notes (Signed)
Daily Progress Note   Patient Name: Leon Sharp       Date: 02/02/2017 DOB: Jul 26, 1921  Age: 83 y.o. MRN#: [id number] Attending Physician: Domenic Polite, MD Primary Care Physician: Jefm Petty, MD Admit Date: 01/29/2017  Reason for Consultation/Follow-up: Establishing goals of care  Subjective:  resting in bed, daughter and son  at bedside  Patient is not as awake and alert   See below:   Length of Stay: 4  Current Medications: Scheduled Meds:  . aspirin EC  81 mg Oral BID  . Chlorhexidine Gluconate Cloth  6 each Topical Q0600  . clopidogrel  75 mg Oral Q breakfast  . donepezil  5 mg Oral QHS  . DULoxetine  20 mg Oral Q breakfast  . famotidine  20 mg Oral QHS  . fludrocortisone  0.1 mg Oral Q breakfast  . insulin glargine  12 Units Subcutaneous QHS  . memantine  28 mg Oral Q breakfast  . metoprolol succinate  25 mg Oral Q breakfast  . mupirocin ointment  1 application Nasal BID  . nystatin  5 mL Oral TID WC & HS  . pantoprazole  40 mg Oral BID  . polyvinyl alcohol  1 drop Both Eyes TID WC  . pravastatin  40 mg Oral Q breakfast  . umeclidinium-vilanterol  1 puff Inhalation Daily    Continuous Infusions: . piperacillin-tazobactam (ZOSYN)  IV 2.25 g (02/02/17 1232)    PRN Meds: acetaminophen, ondansetron **OR** ondansetron (ZOFRAN) IV, RESOURCE THICKENUP CLEAR  Physical Exam         Weak frail elderly person Resting in bed Shallow regular breath sounds More congested this am.  Diminished L side S1 S2 Abdomen soft Has thin extremities Not as awake/alert today  Vital Signs: BP 115/61 (BP Location: Left Arm)   Pulse 93   Temp 97.6 F (36.4 C) (Axillary)   Resp 18   Ht 5\' 10"  (1.778 m)   Wt 72.6 kg (160 lb)   SpO2 100%   BMI 22.96 kg/m  SpO2: SpO2:  100 % O2 Device: O2 Device: Nasal Cannula O2 Flow Rate: O2 Flow Rate (L/min): 1 L/min  Intake/output summary:   Intake/Output Summary (Last 24 hours) at 02/02/17 1334 Last data filed at 02/02/17 1000  Gross per 24 hour  Intake  400 ml  Output              100 ml  Net              300 ml   LBM: Last BM Date: 02/02/17 Baseline Weight: Weight: 72.6 kg (160 lb) Most recent weight: Weight: 72.6 kg (160 lb)       Palliative Assessment/Data:    Flowsheet Rows     Most Recent Value  Intake Tab  Referral Department  Hospitalist  Unit at Time of Referral  Med/Surg Unit  Palliative Care Primary Diagnosis  Other (Comment) [dementia, dysphagia, asp pna, para pneumonic effusion. ]  Palliative Care Type  New Palliative care  Reason for referral  Clarify Goals of Care, Counsel Regarding Hospice  Date first seen by Palliative Care  01/30/17  Clinical Assessment  Palliative Performance Scale Score  10%  Pain Max last 24 hours  4  Pain Min Last 24 hours  3  Dyspnea Max Last 24 Hours  3  Dyspnea Min Last 24 hours  2  Nausea Max Last 24 Hours  3  Nausea Min Last 24 Hours  2  Anxiety Max Last 24 Hours  3  Anxiety Min Last 24 Hours  2  Psychosocial & Spiritual Assessment  Palliative Care Outcomes  Patient/Family meeting held?  Yes  Who was at the meeting?  [place] daughter, patient was not very responsive.   Palliative Care Outcomes  Clarified goals of care      Patient Active Problem List   Diagnosis Date Noted  . HCAP (healthcare-associated pneumonia)   . Encounter for palliative care   . Goals of care, counseling/discussion   . Aspiration pneumonia ([place]) 01/29/2017  . Pleural effusion 01/29/2017  . Dysphagia 01/29/2017  . Dementia 01/29/2017  . COPD with emphysema ([place]) 01/29/2017  . CAD in native artery 01/29/2017  . AKI (acute kidney injury) ([place]) 01/29/2017  . Leg wound, right 12/05/2015  . Fall 05/17/2011  . Subdural hematoma ([place]) 05/15/2011  .  Diabetes mellitus ([place]) 05/15/2011  . Hypertension 05/15/2011  . Syncope 05/15/2011    Palliative Care Assessment & Plan   Patient Profile:    Assessment: Dementia, wheel chair bound at baseline.  Severe dysphagia PNA: asp Worsening AKI Underlying at least stage III CKD  underlying COPD, DM  Recommendations/Plan:   Agree with thoracentesis, to be done later today.   Goals of care and disposition planning re discussed with both son and daughter present at the bedside: we discussed in detail about risks/benefits or SNF rehab with palliative versus residential hospice in detail, the mode of care that is offered at SNF versus at hospice facility discussed in detail, I have answered all of their questions to the best of my ability, await family's decision.    continue to monitor PO intake, renal function, urine output,   Will add resp therapy consult, family asking about breathing trts and whether suction will help.     Code Status:    Code Status Orders        Start     Ordered   01/29/17 1948  Do not attempt resuscitation (DNR)  Continuous    Question Answer Comment  In the event of cardiac or respiratory ARREST Do not call a "code blue"   In the event of cardiac or respiratory ARREST Do not perform Intubation, CPR, defibrillation or ACLS   In the event of cardiac or respiratory ARREST Use medication by any route, position,  wound care, and other measures to relive pain and suffering. May use oxygen, suction and manual treatment of airway obstruction as needed for comfort.      01/29/17 1947    Code Status History    Date Active Date Inactive Code Status Order ID Comments User Context   05/15/2011 11:37 PM 05/19/2011  8:03 PM Full Code [id number]  Rise Patience, MD Inpatient    Advance Directive Documentation     Most Recent Value  Type of Advance Directive  Healthcare Power of Attorney, Living will  Pre-existing out of facility DNR order (yellow form or pink MOST  form)  -  "MOST" Form in Place?  -       Prognosis:   guarded   Discharge Planning:  To Be Determined  Care plan was discussed with  Daughter, son and daughter in law.   Thank you for allowing the Palliative Medicine Team to assist in the care of this patient.   Time In: 10 Time Out: 10.35 Total Time 35 Prolonged Time Billed  no       Greater than 50%  of this time was spent counseling and coordinating care related to the above assessment and plan.  Loistine Chance, MD [phone]  Please contact Palliative Medicine Team phone at [phone] for questions and concerns.

## 2017-02-03 LAB — GLUCOSE, CAPILLARY
GLUCOSE-CAPILLARY: 305 mg/dL — AB (ref 65–99)
Glucose-Capillary: 144 mg/dL — ABNORMAL HIGH (ref 65–99)
Glucose-Capillary: 177 mg/dL — ABNORMAL HIGH (ref 65–99)
Glucose-Capillary: 244 mg/dL — ABNORMAL HIGH (ref 65–99)

## 2017-02-03 LAB — BASIC METABOLIC PANEL WITH GFR
Anion gap: 5 (ref 5–15)
BUN: 29 mg/dL — ABNORMAL HIGH (ref 6–20)
CO2: 31 mmol/L (ref 22–32)
Calcium: 7.9 mg/dL — ABNORMAL LOW (ref 8.9–10.3)
Chloride: 111 mmol/L (ref 101–111)
Creatinine, Ser: 1.99 mg/dL — ABNORMAL HIGH (ref 0.61–1.24)
GFR calc Af Amer: 32 mL/min — ABNORMAL LOW
GFR calc non Af Amer: 27 mL/min — ABNORMAL LOW
Glucose, Bld: 171 mg/dL — ABNORMAL HIGH (ref 65–99)
Potassium: 3.9 mmol/L (ref 3.5–5.1)
Sodium: 147 mmol/L — ABNORMAL HIGH (ref 135–145)

## 2017-02-03 LAB — CBC
HEMATOCRIT: 28.8 % — AB (ref 39.0–52.0)
HEMOGLOBIN: 8.9 g/dL — AB (ref 13.0–17.0)
MCH: 30.6 pg (ref 26.0–34.0)
MCHC: 30.9 g/dL (ref 30.0–36.0)
MCV: 99 fL (ref 78.0–100.0)
Platelets: 167 10*3/uL (ref 150–400)
RBC: 2.91 MIL/uL — ABNORMAL LOW (ref 4.22–5.81)
RDW: 15.2 % (ref 11.5–15.5)
WBC: 7.8 10*3/uL (ref 4.0–10.5)

## 2017-02-03 LAB — CULTURE, BLOOD (ROUTINE X 2)
Culture: NO GROWTH
Culture: NO GROWTH
SPECIAL REQUESTS: ADEQUATE
Special Requests: ADEQUATE

## 2017-02-03 MED ORDER — ALBUTEROL SULFATE (2.5 MG/3ML) 0.083% IN NEBU
2.5000 mg | INHALATION_SOLUTION | Freq: Two times a day (BID) | RESPIRATORY_TRACT | Status: DC
  Administered 2017-02-03 – 2017-02-05 (×4): 2.5 mg via RESPIRATORY_TRACT
  Filled 2017-02-03 (×4): qty 3

## 2017-02-03 MED ORDER — INSULIN ASPART 100 UNIT/ML ~~LOC~~ SOLN
5.0000 [IU] | Freq: Once | SUBCUTANEOUS | Status: AC
  Administered 2017-02-03: 5 [IU] via SUBCUTANEOUS

## 2017-02-03 NOTE — Progress Notes (Signed)
PROGRESS NOTE    Leon Sharp  KNL:[id number] DOB: 10/08/1921 DOA: 01/29/2017 PCP: Jefm Petty, MD  Brief Narrative:Leon Sharp is a 83 year old male with multiple medical problems most notable for dementia, debility/wheelchair-bound, COPD, CAD with numerous stents, severe dysphagia, history of congestive heart failure, chronic kidney disease stage III was diagnosed with aspiration pneumonia last Friday and was started on Augmentin then. He's had extensive workup for dysphagia, his most recent modified barium swallow was 2 weeks ago, where it was recommended to place a PEG tube, which was declined by his daughter and patient continued on a pured diet with honey thick liquids. He also had an endoscopy a week ago, he was noted to have a mild esophageal stricture which was dilated then. He was brought to the emergency room today due to increased shortness of breath, Workup in the ED notable for mild leukocytosis, hyponatremia, creatinine of 2.2, normal lactate, chest x-ray with left lower lobe pneumonia and moderate left pleural effusion. Palliative following, slight improvement, plan for DC to SNF with palliative care FU  Assessment & Plan:  Aspiration pneumonia (HCC) with large L effusion -Long-standing history of severe dysphagia  -Admitted, started on IV Zosyn, and Repeat chest x-ray 8/11 with worsening large left likely parapneumonic effusion -Long-standing history of dysphagia, failed modified barium swallow 2 weeks ago, speech therapist following but were unable to evaluate him due to depressed mentation -Appreciate palliative consult -has been more alert and lucid for 2days now, awaiting repeat SLP eval , increased po intake since yesterday,  -s/p Therapeutic thoracentesis yesterday for Palliative purposes and 1.5L of blood tinged fluid was drained, I have held his ASA/plavix and Lovenox since then, I discussed with daughter due to overall poor condition, will not pursue CT  chest since regardless of findings will not change management, not candidate for chest tube/biopsy or any invasive treatment. -Continue IV Zosyn and discharged changed to oral Augmentin to complete 10 days of therapy. -I have also talked to daughter abt considering Hospice at discharge despite current improvement since its only a matter of time before he aspirates again, she is agreeable to Hospice at discharge but since he may not be appropriate for residential hospice at this time due to prognosis being greater than 2 weeks, May and I am going to short-term rehabilitation with palliative care follow-up, and when he deteriorates further then might be more appropriate for hospice. -Daughter understands that his overall prognosis remains very poor due to advanced age, dementia, debility/bedbound status, severe dysphagia, severe malnutrition  Severe dysphagia -As above, SLP following -Awaiting repeat SLP evaluation  Alzheimer's dementia -resume [place]   AKI on CKD3 with mild hypernatremia -Baseline creatinine around 1.4-1.5Holding Celebrex, -creatinine briefly improved and then worsened again along with soft blood pressures and low urine output -now stable, stopped IV fluids    DM -Stopped Lantus due low CBGs and worsening renal function  -SSI   COPD with emphysema (HCC) -Stable, nebs when necessary    CAD in native artery -Stable, continue metoprolol, Plavix and statin  DVT prophylaxis: Heparin subcutaneous  Code Status: DO NOT RESUSCITATE  Family Communication:  long discussion with daughter 80/10, 8/11, 8/12 , 8/13 and 8/14 Disposition Plan:  SNF with Palliative care and eventually residential hospice when declines next   Consultants:   Palliative medicine  Antimicrobials:   IV Zosyn since 8/9  Procedures -Thoracentesis 8/13, 1.5 L of blood-tinged fluid drained   Subjective: -More alert today, wants to go home, asking for nutritive condoms catheter -  Was able to  feed himself a few bites of eggs for breakfast  Objective: Vitals:   02/02/17 2147 02/03/17 0620 02/03/17 1110 02/03/17 1239  BP: (!) 111/45 (!) 107/52 (!) 109/56 (!) 114/52  Pulse: 83 76  91  Resp: 18 18  18   Temp: (!) 97.4 F (36.3 C) (!) 97.5 F (36.4 C)  97.7 F (36.5 C)  TempSrc: Oral Oral  Oral  SpO2: 100% 100%  100%  Weight:      Height:        Intake/Output Summary (Last 24 hours) at 02/03/17 1348 Last data filed at 02/03/17 1237  Gross per 24 hour  Intake              794 ml  Output              400 ml  Net              394 ml   Filed Weights   01/29/17 1304  Weight: 72.6 kg (160 lb)    Examination:  .Gen: Awake, more alert, frail cachectic elderly white male sitting up in bed,  HEENT: PERRLA, Neck supple, no JVD Lungs: Decreased breath sounds at the left CVS: RRR,No Gallops,Rubs or new Murmurs Abd: soft, Non tender, non distended, BS present Extremities: No Cyanosis, Clubbing or edema Skin: no new rashes Admitted, started on IV Zosyn  Data Reviewed:   CBC:  Recent Labs Lab 01/29/17 1322  01/30/17 0413 01/31/17 0426 02/01/17 0421 02/02/17 0435 02/03/17 0410  WBC 12.7*  --  7.7 7.5 7.4 7.5 7.8  NEUTROABS 9.2*  --   --   --   --   --   --   HGB 11.0*  < > 9.9* 10.7* 10.3* 9.7* 8.9*  HCT 34.7*  < > 31.8* 34.3* 33.3* 31.7* 28.8*  MCV 98.0  --  97.2 99.7 100.6* 98.8 99.0  PLT 232  --  174 168 173 163 167  < > = values in this interval not displayed. Basic Metabolic Panel:  Recent Labs Lab 01/30/17 0413 01/31/17 0426 02/01/17 0421 02/02/17 0435 02/03/17 0410  NA 145 147* 146* 148* 147*  K 4.2 3.9 3.7 3.8 3.9  CL 109 110 108 112* 111  CO2 29 29 29 29 31   GLUCOSE 157* 90 135* 103* 171*  BUN 30* 27* 26* 27* 29*  CREATININE 1.97* 2.21* 2.17* 2.09* 1.99*  CALCIUM 8.1* 7.9* 8.0* 8.0* 7.9*   GFR: Estimated Creatinine Clearance: 24.3 mL/min (A) (by C-G formula based on SCr of 1.99 mg/dL (H)). Liver Function Tests: No results for input(s):  AST, ALT, ALKPHOS, BILITOT, PROT, ALBUMIN in the last 168 hours. No results for input(s): LIPASE, AMYLASE in the last 168 hours. No results for input(s): AMMONIA in the last 168 hours. Coagulation Profile: No results for input(s): INR, PROTIME in the last 168 hours. Cardiac Enzymes: No results for input(s): CKTOTAL, CKMB, CKMBINDEX, TROPONINI in the last 168 hours. BNP (last 3 results) No results for input(s): PROBNP in the last 8760 hours. HbA1C: No results for input(s): HGBA1C in the last 72 hours. CBG:  Recent Labs Lab 02/02/17 1204 02/02/17 1819 02/02/17 2141 02/03/17 0759 02/03/17 1215  GLUCAP 168* 163* 191* 144* 177*   Lipid Profile: No results for input(s): CHOL, HDL, LDLCALC, TRIG, CHOLHDL, LDLDIRECT in the last 72 hours. Thyroid Function Tests: No results for input(s): TSH, T4TOTAL, FREET4, T3FREE, THYROIDAB in the last 72 hours. Anemia Panel: No results for input(s): VITAMINB12, FOLATE, FERRITIN, TIBC, IRON,  RETICCTPCT in the last 72 hours. Urine analysis:    Component Value Date/Time   COLORURINE YELLOW 01/31/2017 0035   APPEARANCEUR CLOUDY (A) 01/31/2017 0035   LABSPEC 1.014 01/31/2017 0035   PHURINE 5.0 01/31/2017 0035   GLUCOSEU NEGATIVE 01/31/2017 0035   HGBUR LARGE (A) 01/31/2017 0035   BILIRUBINUR NEGATIVE 01/31/2017 0035   KETONESUR NEGATIVE 01/31/2017 0035   PROTEINUR 30 (A) 01/31/2017 0035   UROBILINOGEN 1.0 10/25/2013 2305   NITRITE NEGATIVE 01/31/2017 0035   LEUKOCYTESUR SMALL (A) 01/31/2017 0035   Sepsis Labs: @LABRCNTIP (procalcitonin:4,lacticidven:4)  ) Recent Results (from the past 240 hour(s))  Culture, blood (routine x 2)     Status: None   Collection Time: 01/29/17  4:24 PM  Result Value Ref Range Status   Specimen Description BLOOD BLOOD LEFT FOREARM  Final   Special Requests   Final    BOTTLES DRAWN AEROBIC AND ANAEROBIC Blood Culture adequate volume   Culture   Final    NO GROWTH 5 DAYS Performed at [facility], [place] [address] [place], [place] [address]    Report Status 02/03/2017 FINAL  Final  Culture, blood (routine x 2)     Status: None   Collection Time: 01/29/17  4:44 PM  Result Value Ref Range Status   Specimen Description BLOOD RIGHT ARM  Final   Special Requests IN PEDIATRIC BOTTLE Blood Culture adequate volume  Final   Culture   Final    NO GROWTH 5 DAYS Performed at [facility], [place] [address], [place], [place] [address]    Report Status 02/03/2017 FINAL  Final  MRSA PCR Screening     Status: Abnormal   Collection Time: 01/29/17  7:58 PM  Result Value Ref Range Status   MRSA by PCR POSITIVE (A) NEGATIVE Final    Comment:        The GeneXpert MRSA Assay (FDA approved for NASAL specimens only), is one component of a comprehensive MRSA colonization surveillance program. It is not intended to diagnose MRSA infection nor to guide or monitor treatment for MRSA infections. RESULT CALLED TO, READ BACK BY AND VERIFIED WITH: BELMA CERIC,RN [id number] @ 4854 BY J SCOTTON   Body fluid culture     Status: None (Preliminary result)   Collection Time: 02/02/17  4:08 PM  Result Value Ref Range Status   Specimen Description PLEURAL LEFT  Final   Special Requests NONE  Final   Gram Stain   Final    FEW WBC PRESENT,BOTH PMN AND MONONUCLEAR NO ORGANISMS SEEN    Culture   Final    NO GROWTH < 24 HOURS Performed at [facility], [place] [address] [place], [place] [address]    Report Status PENDING  Incomplete         Radiology Studies: Dg Chest 1 View  Result Date: 02/02/2017 CLINICAL DATA:  Status post thoracentesis. EXAM: CHEST 1 VIEW COMPARISON:  Radiographs of January 31, 2017. FINDINGS: Stable cardiomediastinal silhouette. Atherosclerosis of thoracic aorta is noted. Sternotomy wires are noted. Left pleural effusion is significantly smaller status post thoracentesis. No pneumothorax is noted. Mild right basilar subsegmental atelectasis is noted. Bony thorax is unremarkable.  IMPRESSION: Aortic atherosclerosis. Left pleural effusion significantly smaller status post thoracentesis. Mild right basilar subsegmental atelectasis. Electronically Signed   By: Marijo Conception, M.D.   On: 02/02/2017 16:22   US Thoracentesis Asp Pleural Space W/img Guide  Result Date: 02/02/2017 INDICATION: Alzheimer's dementia, chronic kidney disease, dysphagia, COPD, pneumonia,  large left pleural effusion. Request made for diagnostic and therapeutic left thoracentesis. EXAM: ULTRASOUND GUIDED DIAGNOSTIC AND THERAPEUTIC LEFT THORACENTESIS MEDICATIONS: None. COMPLICATIONS: None immediate. PROCEDURE: An ultrasound guided thoracentesis was thoroughly discussed with the patient and questions answered. The benefits, risks, alternatives and complications were also discussed. The patient understands and wishes to proceed with the procedure. Written consent was obtained. Ultrasound was performed to localize and mark an adequate pocket of fluid in the left chest. The area was then prepped and draped in the normal sterile fashion. 1% Lidocaine was used for local anesthesia. Under ultrasound guidance a Safe-T-Centesis catheter was introduced. Thoracentesis was performed. The catheter was removed and a dressing applied. FINDINGS: A total of approximately 1.5 liters of bloody fluid was removed. Samples were sent to the laboratory as requested by the clinical team. Due to this being patient's initial thoracentesis only the above amount of fluid was removed today. IMPRESSION: Successful ultrasound guided diagnostic and therapeutic left thoracentesis yielding 1.5 liters of pleural fluid. Dr. Broadus John was notified of the above findings. Read by: Rowe Robert, PA-C Electronically Signed   By: Sandi Mariscal M.D.   On: 02/02/2017 16:19        Scheduled Meds: . donepezil  5 mg Oral QHS  . DULoxetine  20 mg Oral Q breakfast  . famotidine  20 mg Oral QHS  . fludrocortisone  0.1 mg Oral Q breakfast  . insulin glargine  12  Units Subcutaneous QHS  . memantine  28 mg Oral Q breakfast  . metoprolol succinate  25 mg Oral Q breakfast  . nystatin  5 mL Oral TID WC & HS  . pantoprazole  40 mg Oral BID  . polyvinyl alcohol  1 drop Both Eyes TID WC  . pravastatin  40 mg Oral Q breakfast  . umeclidinium-vilanterol  1 puff Inhalation Daily   Continuous Infusions: . piperacillin-tazobactam (ZOSYN)  IV Stopped (02/03/17 1307)     LOS: 5 days    Time spent: 35min    Domenic Polite, MD [facility] Pager [phone]  If 7PM-7AM, please contact night-coverage [email or web address] Password [email or web address] 02/03/2017, 1:48 PM

## 2017-02-03 NOTE — Progress Notes (Signed)
  Speech Language Pathology Treatment: Dysphagia  Patient Details Name: Leon Sharp MRN: [id number] DOB: April 16, 1922 Today's Date: 02/03/2017 Time: [id number] SLP Time Calculation (min) (ACUTE ONLY): 19 min  Assessment / Plan / Recommendation Clinical Impression  SLP spoke with MD regarding daughter's wishes for comfort diet.  Received approval for dietary advancement and family educated that this is a COMFORT diet - not for nutritional support.  Demonstrated to use of daughter use of oral suction, provided pt with thin soda and ice chips.    Observed pt consuming ice chips *swallow followed by immediate cough* and thin soda - via tsp/cup.  Pt does need full cues to take small single boluses.  Pt will aspirate regardless of diet consistency and daughter states she wants him to be "comfortable".    Reviewed aspiration mitigation strategies and provided instructions in writing/posting in room. Will follow up x1 to assure family comfort with plan.  Thanks for allowing me to help with this pt care plan.     HPI HPI: 83 yo male adm to [facility] with respiratory difficulties due to aspiration pneumonia.  PMH + for Parkinsons', demenia, MI GERD, HOH, HTN, skin cancer, arthritis, clotting d/o, esophageal dysphagia and oropharyngeal dysphagia.   Pt recently seen at [facility] for MBS on 01/13/2017 - recommendation for NPO vs puree/HTL if desire to feed pt with known risks.    Family determined to proceed with puree/honey diet with strategies including small bites/double swallows/alternating foods/drinks.  CXR finding concerning for potential left lower lobe pna. Swallow evaluation ordered at [facility].       SLP Plan  Continue with current plan of care       Recommendations  Diet recommendations: Dysphagia 3 (mechanical soft);Thin liquid (family desires comfort intake- few bites/sips ONLY) Liquids provided via: Teaspoon;Cup (use teaspoon if pt coughing with intake ) Medication Administration: Crushed with  puree Supervision: Patient able to self feed;Full supervision/cueing for compensatory strategies Compensations: Small sips/bites;Slow rate;Multiple dry swallows after each bite/sip Postural Changes and/or Swallow Maneuvers: Seated upright 90 degrees;Upright 30-60 min after meal                Oral Care Recommendations: Oral care QID Follow up Recommendations: Skilled Nursing facility SLP Visit Diagnosis: Dysphagia, oropharyngeal phase (R13.12) Plan: Continue with current plan of care       GO               Luanna Salk, [place] [facility] SLP [phone]  Macario Golds 02/03/2017, 4:02 PM

## 2017-02-03 NOTE — Progress Notes (Signed)
Daily Progress Note   Patient Name: Leon Sharp       Date: 02/03/2017 DOB: Nov 23, 1921  Age: 83 y.o. MRN#: [id number] Attending Physician: Domenic Polite, MD Primary Care Physician: Jefm Petty, MD Admit Date: 01/29/2017  Reason for Consultation/Follow-up: Establishing goals of care  Subjective:  resting in bed, daughter, son in law, and 2 cousins at bedside  Patient is awake and alert and eating small bites from dinner tray.   See below:   Length of Stay: 5  Current Medications: Scheduled Meds:  . albuterol  2.5 mg Nebulization BID  . donepezil  5 mg Oral QHS  . DULoxetine  20 mg Oral Q breakfast  . famotidine  20 mg Oral QHS  . fludrocortisone  0.1 mg Oral Q breakfast  . insulin glargine  12 Units Subcutaneous QHS  . memantine  28 mg Oral Q breakfast  . metoprolol succinate  25 mg Oral Q breakfast  . nystatin  5 mL Oral TID WC & HS  . pantoprazole  40 mg Oral BID  . polyvinyl alcohol  1 drop Both Eyes TID WC  . pravastatin  40 mg Oral Q breakfast  . umeclidinium-vilanterol  1 puff Inhalation Daily    Continuous Infusions: . piperacillin-tazobactam (ZOSYN)  IV 2.25 g (02/03/17 1839)    PRN Meds: acetaminophen, ondansetron **OR** ondansetron (ZOFRAN) IV, RESOURCE THICKENUP CLEAR  Physical Exam         Weak frail elderly person Resting in bed, eating few bites of dinner Shallow regular breath sounds More congested this am.  Diminished L side S1 S2 Abdomen soft Has thin extremities Not as awake/alert today  Vital Signs: BP (!) 114/52 (BP Location: Left Arm)   Pulse 91   Temp 97.7 F (36.5 C) (Oral)   Resp 18   Ht 5\' 10"  (1.778 m)   Wt 72.6 kg (160 lb)   SpO2 100%   BMI 22.96 kg/m  SpO2: SpO2: 100 % O2 Device: O2 Device: Nasal Cannula O2 Flow  Rate: O2 Flow Rate (L/min): 3 L/min  Intake/output summary:   Intake/Output Summary (Last 24 hours) at 02/03/17 1917 Last data filed at 02/03/17 1237  Gross per 24 hour  Intake              794 ml  Output  400 ml  Net              394 ml   LBM: Last BM Date: 02/02/17 Baseline Weight: Weight: 72.6 kg (160 lb) Most recent weight: Weight: 72.6 kg (160 lb)       Palliative Assessment/Data:    Flowsheet Rows     Most Recent Value  Intake Tab  Referral Department  Hospitalist  Unit at Time of Referral  Med/Surg Unit  Palliative Care Primary Diagnosis  Other (Comment) [dementia, dysphagia, asp pna, para pneumonic effusion. ]  Palliative Care Type  New Palliative care  Reason for referral  Clarify Goals of Care, Counsel Regarding Hospice  Date first seen by Palliative Care  01/30/17  Clinical Assessment  Palliative Performance Scale Score  10%  Pain Max last 24 hours  4  Pain Min Last 24 hours  3  Dyspnea Max Last 24 Hours  3  Dyspnea Min Last 24 hours  2  Nausea Max Last 24 Hours  3  Nausea Min Last 24 Hours  2  Anxiety Max Last 24 Hours  3  Anxiety Min Last 24 Hours  2  Psychosocial & Spiritual Assessment  Palliative Care Outcomes  Patient/Family meeting held?  Yes  Who was at the meeting?  [name] daughter, patient was not very responsive.   Palliative Care Outcomes  Clarified goals of care      Patient Active Problem List   Diagnosis Date Noted  . HCAP (healthcare-associated pneumonia)   . Encounter for palliative care   . Goals of care, counseling/discussion   . Aspiration pneumonia ([place]) 01/29/2017  . Pleural effusion 01/29/2017  . Dysphagia 01/29/2017  . Dementia 01/29/2017  . COPD with emphysema ([place]) 01/29/2017  . CAD in native artery 01/29/2017  . AKI (acute kidney injury) ([place]) 01/29/2017  . Leg wound, right 12/05/2015  . Fall 05/17/2011  . Subdural hematoma ([place]) 05/15/2011  . Diabetes mellitus ([place]) 05/15/2011  . Hypertension  05/15/2011  . Syncope 05/15/2011    Palliative Care Assessment & Plan   Patient Profile:    Assessment: Dementia, wheel chair bound at baseline.  Severe dysphagia PNA: asp Worsening AKI Underlying at least stage III CKD  underlying COPD, DM  Recommendations/Plan:  Daughter reports better s/p thoracentesis   Goals of care and disposition planning:  He was started on diet for comfort today.  Reviewed SNF rehab with palliative versus hospice.  Family leaning toward SNF with palliative, unless he were to grossly aspirate on comfort feeds while still in the hospital.  I will check in again tomorrow, but as of now, plan is for SNF with palliative.  Continue to monitor PO intake, renal function, urine output,      Code Status:    Code Status Orders        Start     Ordered   01/29/17 1948  Do not attempt resuscitation (DNR)  Continuous    Question Answer Comment  In the event of cardiac or respiratory ARREST Do not call a "code blue"   In the event of cardiac or respiratory ARREST Do not perform Intubation, CPR, defibrillation or ACLS   In the event of cardiac or respiratory ARREST Use medication by any route, position, wound care, and other measures to relive pain and suffering. May use oxygen, suction and manual treatment of airway obstruction as needed for comfort.      01/29/17 1947    Code Status History    Date Active Date  Inactive Code Status Order ID Comments User Context   05/15/2011 11:37 PM 05/19/2011  8:03 PM Full Code [id number]  Rise Patience, MD Inpatient    Advance Directive Documentation     Most Recent Value  Type of Advance Directive  Healthcare Power of Attorney, Living will  Pre-existing out of facility DNR order (yellow form or pink MOST form)  -  "MOST" Form in Place?  -       Prognosis:   guarded   Discharge Planning:  Likely SNF with palliative  Care plan was discussed with  Daughter, son in law.   Thank you for allowing the  Palliative Medicine Team to assist in the care of this patient.  Total time: 20 minutes    Greater than 50%  of this time was spent counseling and coordinating care related to the above assessment and plan.  Micheline Rough, MD Please contact Palliative Medicine Team phone at [phone] for questions and concerns.

## 2017-02-03 NOTE — Progress Notes (Addendum)
  Speech Language Pathology Treatment: Dysphagia  Patient Details Name: Leon Sharp MRN: [id number] DOB: 04-24-1922 Today's Date: 02/03/2017 Time: 4944-9675 SLP Time Calculation (min) (ACUTE ONLY): 30 min  Assessment / Plan / Recommendation Clinical Impression  Pt sitting upright today, responsive and feeding himself pudding/custard and SlP provided honey thickened water.  Pt continues to present with indications of oropharyngeal dysphagia and airway compromise.  Pt with delayed oral transiting and oral residuals which he is not fully aware - use of oral suction to clear oral residuals helpful. Aspiration characteristics included immediate throat clearing and abnormal audible swallow.  Suspect possible cricopharyngeal dysfunction - Pt immediately stated "I'm ok" after swallow.   He is impulsive and tends to take large consecutive boluses before clearing.    Daughter Leon Sharp) present and was educated to findings of prior esophagram (dysmotility and Aspiration) in NOV 2017 and MBS a few weeks ago at [facility].  She states she did not know at the time the pt had aspirated on his prior esophagram.  Therefore this pt has been aspirating at least 9 months.  Further advised her that feeding tubes do NOT prevent aspiration and are not clinically indicated in pt's over the age of 60 with cognitive deficits.  She expressed appreciation for this information.    Daughter reports that this is the first aspiration pneumonia patient has experienced.  Advised to ongoing aspiration despite diet modification and daughter reports goal is for pt to be comfortable.  Puree/honey thickened diet is not palatable to pt  and aspiration of thicker consistencies are more difficult for pulmonary clearance.     Daughter agree to comfort diet and SLP advised that liquids may be easier for him to tolerate (eg icecream, Ensure, water, etc) vs solids/thicker.  Will advise MD that daughter wants comfort diet with known aspiration.      HPI HPI: 83 yo male adm to [facility] with respiratory difficulties due to aspiration pneumonia.  PMH + for Parkinsons', demenia, MI GERD, HOH, HTN, skin cancer, arthritis, clotting d/o, esophageal dysphagia and oropharyngeal dysphagia.   Pt recently seen at [facility] for MBS on 01/13/2017 - recommendation for NPO vs puree/HTL if desire to feed pt with known risks.    Family determined to proceed with puree/honey diet with strategies including small bites/double swallows/alternating foods/drinks.  CXR finding concerning for potential left lower lobe pna. Swallow evaluation ordered at [facility].       SLP Plan  Continue with current plan of care       Recommendations  Diet recommendations: Dysphagia 3 (mechanical soft);Thin liquid (family desires comfort intake- few bites/sips ONLY) Liquids provided via: Teaspoon;Cup (use teaspoon if pt coughing with intake ) Medication Administration: Crushed with puree Supervision: Patient able to self feed;Full supervision/cueing for compensatory strategies Compensations: Small sips/bites;Slow rate;Multiple dry swallows after each bite/sip Postural Changes and/or Swallow Maneuvers: Seated upright 90 degrees;Upright 30-60 min after meal                Oral Care Recommendations: Oral care QID Follow up Recommendations: Skilled Nursing facility SLP Visit Diagnosis: Dysphagia, oropharyngeal phase (R13.12) Plan: Continue with current plan of care       GO               Leon Sharp, [facility] SLP [phone]  Macario Golds 02/03/2017, 4:01 PM

## 2017-02-04 ENCOUNTER — Inpatient Hospital Stay (HOSPITAL_COMMUNITY): Payer: Medicare Other

## 2017-02-04 DIAGNOSIS — L8990 Pressure ulcer of unspecified site, unspecified stage: Secondary | ICD-10-CM | POA: Insufficient documentation

## 2017-02-04 DIAGNOSIS — G309 Alzheimer's disease, unspecified: Secondary | ICD-10-CM

## 2017-02-04 DIAGNOSIS — J690 Pneumonitis due to inhalation of food and vomit: Principal | ICD-10-CM

## 2017-02-04 DIAGNOSIS — F0280 Dementia in other diseases classified elsewhere without behavioral disturbance: Secondary | ICD-10-CM

## 2017-02-04 DIAGNOSIS — N179 Acute kidney failure, unspecified: Secondary | ICD-10-CM

## 2017-02-04 LAB — GLUCOSE, CAPILLARY
GLUCOSE-CAPILLARY: 155 mg/dL — AB (ref 65–99)
Glucose-Capillary: 164 mg/dL — ABNORMAL HIGH (ref 65–99)
Glucose-Capillary: 143 mg/dL — ABNORMAL HIGH (ref 65–99)
Glucose-Capillary: 147 mg/dL — ABNORMAL HIGH (ref 65–99)

## 2017-02-04 LAB — BASIC METABOLIC PANEL
ANION GAP: 6 (ref 5–15)
BUN: 30 mg/dL — AB (ref 6–20)
CHLORIDE: 110 mmol/L (ref 101–111)
CO2: 32 mmol/L (ref 22–32)
Calcium: 8 mg/dL — ABNORMAL LOW (ref 8.9–10.3)
Creatinine, Ser: 1.99 mg/dL — ABNORMAL HIGH (ref 0.61–1.24)
GFR calc Af Amer: 32 mL/min — ABNORMAL LOW (ref >60)
GFR calc non Af Amer: 27 mL/min — ABNORMAL LOW (ref >60)
GLUCOSE: 200 mg/dL — AB (ref 65–99)
POTASSIUM: 3.8 mmol/L (ref 3.5–5.1)
Sodium: 148 mmol/L — ABNORMAL HIGH (ref 135–145)

## 2017-02-04 LAB — CBC
HEMATOCRIT: 28 % — AB (ref 39.0–52.0)
HEMOGLOBIN: 9 g/dL — AB (ref 13.0–17.0)
MCH: 32.1 pg (ref 26.0–34.0)
MCHC: 32.1 g/dL (ref 30.0–36.0)
MCV: 100 fL (ref 78.0–100.0)
Platelets: 148 10*3/uL — ABNORMAL LOW (ref 150–400)
RBC: 2.8 MIL/uL — AB (ref 4.22–5.81)
RDW: 15 % (ref 11.5–15.5)
WBC: 8.5 10*3/uL (ref 4.0–10.5)

## 2017-02-04 MED ORDER — DEXTROSE 5 % IV SOLN
INTRAVENOUS | Status: DC
  Administered 2017-02-04 – 2017-02-05 (×2): via INTRAVENOUS

## 2017-02-04 MED ORDER — INSULIN ASPART 100 UNIT/ML ~~LOC~~ SOLN
0.0000 [IU] | Freq: Three times a day (TID) | SUBCUTANEOUS | Status: DC
  Administered 2017-02-04 – 2017-02-05 (×3): 1 [IU] via SUBCUTANEOUS
  Administered 2017-02-06: 3 [IU] via SUBCUTANEOUS
  Administered 2017-02-06: 5 [IU] via SUBCUTANEOUS
  Administered 2017-02-06: 2 [IU] via SUBCUTANEOUS
  Administered 2017-02-07 – 2017-02-08 (×4): 3 [IU] via SUBCUTANEOUS
  Administered 2017-02-08: 2 [IU] via SUBCUTANEOUS
  Administered 2017-02-08: 5 [IU] via SUBCUTANEOUS
  Administered 2017-02-09 (×2): 3 [IU] via SUBCUTANEOUS

## 2017-02-04 NOTE — Progress Notes (Addendum)
Pharmacy Antibiotic Note  Leon Sharp is a 83 y.o. male admitted on 01/29/2017 with aspiration pneumonia with large pleural effusion.  Pharmacy has been consulted for zosyn dosing.  S/p thoracentesis 8/13.  Plan:  Continue Zosyn 2.25gm IV q6h over 71min infusion based on age and borderline CrCl for dose adjustment  Noted plans for transition to Augmentin to complete total 10 day antibiotics course. Recommend Augmentin 500 mg BID with last doses on 8/18.  Height: 5\' 10"  (177.8 cm) Weight: 160 lb (72.6 kg) IBW/kg (Calculated) : 73  Temp (24hrs), Avg:97.5 F (36.4 C), Min:97.4 F (36.3 C), Max:97.7 F (36.5 C)   Recent Labs Lab 01/29/17 1635  01/31/17 0426 02/01/17 0421 02/02/17 0435 02/03/17 0410 02/04/17 0413  WBC  --   < > 7.5 7.4 7.5 7.8 8.5  CREATININE  --   < > 2.21* 2.17* 2.09* 1.99* 1.99*  LATICACIDVEN 1.43  --   --   --   --   --   --   < > = values in this interval not displayed.  Estimated Creatinine Clearance: 24.3 mL/min (A) (by C-G formula based on SCr of 1.99 mg/dL (H)).    No Known Allergies  Antimicrobials this admission: 8/9 zosyn >>  Dose adjustments this admission:   Microbiology results: 8/9 BCx: ngtd 8/13 pleural fluid cx ngtd 8/9 MRSA screen neg  Thank you for allowing pharmacy to be a part of this patient's care.  Hershal Coria, PharmD, BCPS Pager: [phone] 02/04/2017 12:12 PM

## 2017-02-04 NOTE — Progress Notes (Signed)
PROGRESS NOTE    Leon Sharp  BHA:[id number] DOB: 1922/01/16 DOA: 01/29/2017 PCP: Jefm Petty, MD  Brief Narrative:Leon Sharp is a 83 year old male with multiple medical problems most notable for dementia, debility/wheelchair-bound, COPD, CAD with numerous stents, severe dysphagia, history of congestive heart failure, chronic kidney disease stage III was diagnosed with aspiration pneumonia last Friday and was started on Augmentin then. He's had extensive workup for dysphagia, his most recent modified barium swallow was 2 weeks ago, where it was recommended to place a PEG tube, which was declined by his daughter and patient continued on a pured diet with honey thick liquids. He also had an endoscopy a week ago, he was noted to have a mild esophageal stricture which was dilated then. He was brought to the emergency room today due to increased shortness of breath, Workup in the ED notable for mild leukocytosis, hyponatremia, creatinine of 2.2, normal lactate, chest x-ray with left lower lobe pneumonia and moderate left pleural effusion. Palliative following, slight improvement, plan for DC to SNF with palliative care FU  Assessment & Plan:  Aspiration pneumonia (HCC) with large L effusion -Long-standing history of severe dysphagia  -Admitted, started on IV Zosyn, and Repeat chest x-ray 8/11 with worsening large left likely parapneumonic effusion -Long-standing history of dysphagia, failed modified barium swallow 2 weeks ago, speech therapist following but were unable to evaluate him due to depressed mentation -Appreciate palliative consult -s/p Therapeutic thoracentesis 8-13 for Palliative purposes and 1.5L of blood tinged fluid was drained.  -Continue IV Zosyn and discharged changed to oral Augmentin to complete 10 days of therapy. -more hypoxic and lethargic today. Will repeat chest x ray.   Acute encephalopathy;  Patient more lethargic today. Per daughter was was very sleepy  like this on Friday.  Will resume IV fluids.  Will check CT head.  See note from Palliative care, no ABG<   Severe dysphagia -As above, SLP following He was started on comfort feeding 8-14, today he is more lethargic.   Alzheimer's dementia -resume Namenda   AKI on CKD3 with mild hypernatremia -Baseline creatinine around 1.4-1.5Holding Celebrex, -resume IV fluids.    DM -on low dose lantus.  -SSI   COPD with emphysema (HCC) -Stable, nebs when necessary    CAD in native artery -Stable, continue metoprolol, Plavix and statin  DVT prophylaxis: Heparin subcutaneous  Code Status: DO NOT RESUSCITATE  Family Communication:  care discussed with daughter    Consultants:   Palliative medicine  Antimicrobials:   IV Zosyn since 8/9  Procedures -Thoracentesis 8/13, 1.5 L of blood-tinged fluid drained   Subjective: Lethargic, would open eyes to voice, falls back to sleep.    Objective: Vitals:   02/03/17 2058 02/03/17 2310 02/04/17 0524 02/04/17 1223  BP: (!) 132/59  (!) 102/52 (!) 121/52  Pulse: 80  81 84  Resp: 18  18   Temp: (!) 97.4 F (36.3 C)  (!) 97.5 F (36.4 C) 98 F (36.7 C)  TempSrc: Oral  Axillary Axillary  SpO2: 100% 100% 99% 100%  Weight:      Height:        Intake/Output Summary (Last 24 hours) at 02/04/17 1447 Last data filed at 02/04/17 0900  Gross per 24 hour  Intake              150 ml  Output              125 ml  Net  25 ml   Filed Weights   01/29/17 1304  Weight: 72.6 kg (160 lb)    Examination:  .Gen: cachetic, lethargic HEENT: No JVD Lungs: no increase work of breathing. Bilateral air movement.  CVS: RRR, No murmur Abd: Soft, nt, nd Extremities: no cyanosis.  Skin: no new rashes  Data Reviewed:   CBC:  Recent Labs Lab 01/29/17 1322  01/31/17 0426 02/01/17 0421 02/02/17 0435 02/03/17 0410 02/04/17 0413  WBC 12.7*  < > 7.5 7.4 7.5 7.8 8.5  NEUTROABS 9.2*  --   --   --   --   --   --   HGB 11.0*   < > 10.7* 10.3* 9.7* 8.9* 9.0*  HCT 34.7*  < > 34.3* 33.3* 31.7* 28.8* 28.0*  MCV 98.0  < > 99.7 100.6* 98.8 99.0 100.0  PLT 232  < > 168 173 163 167 148*  < > = values in this interval not displayed. Basic Metabolic Panel:  Recent Labs Lab 01/31/17 0426 02/01/17 0421 02/02/17 0435 02/03/17 0410 02/04/17 0413  NA 147* 146* 148* 147* 148*  K 3.9 3.7 3.8 3.9 3.8  CL 110 108 112* 111 110  CO2 29 29 29 31  32  GLUCOSE 90 135* 103* 171* 200*  BUN 27* 26* 27* 29* 30*  CREATININE 2.21* 2.17* 2.09* 1.99* 1.99*  CALCIUM 7.9* 8.0* 8.0* 7.9* 8.0*   GFR: Estimated Creatinine Clearance: 24.3 mL/min (A) (by C-G formula based on SCr of 1.99 mg/dL (H)). Liver Function Tests: No results for input(s): AST, ALT, ALKPHOS, BILITOT, PROT, ALBUMIN in the last 168 hours. No results for input(s): LIPASE, AMYLASE in the last 168 hours. No results for input(s): AMMONIA in the last 168 hours. Coagulation Profile: No results for input(s): INR, PROTIME in the last 168 hours. Cardiac Enzymes: No results for input(s): CKTOTAL, CKMB, CKMBINDEX, TROPONINI in the last 168 hours. BNP (last 3 results) No results for input(s): PROBNP in the last 8760 hours. HbA1C: No results for input(s): HGBA1C in the last 72 hours. CBG:  Recent Labs Lab 02/03/17 1215 02/03/17 1655 02/03/17 2152 02/04/17 0729 02/04/17 1203  GLUCAP 177* 305* 244* 164* 143*   Lipid Profile: No results for input(s): CHOL, HDL, LDLCALC, TRIG, CHOLHDL, LDLDIRECT in the last 72 hours. Thyroid Function Tests: No results for input(s): TSH, T4TOTAL, FREET4, T3FREE, THYROIDAB in the last 72 hours. Anemia Panel: No results for input(s): VITAMINB12, FOLATE, FERRITIN, TIBC, IRON, RETICCTPCT in the last 72 hours. Urine analysis:    Component Value Date/Time   COLORURINE YELLOW 01/31/2017 0035   APPEARANCEUR CLOUDY (A) 01/31/2017 0035   LABSPEC 1.014 01/31/2017 0035   PHURINE 5.0 01/31/2017 0035   GLUCOSEU NEGATIVE 01/31/2017 0035   HGBUR  LARGE (A) 01/31/2017 0035   BILIRUBINUR NEGATIVE 01/31/2017 0035   KETONESUR NEGATIVE 01/31/2017 0035   PROTEINUR 30 (A) 01/31/2017 0035   UROBILINOGEN 1.0 10/25/2013 2305   NITRITE NEGATIVE 01/31/2017 0035   LEUKOCYTESUR SMALL (A) 01/31/2017 0035   Sepsis Labs: @LABRCNTIP (procalcitonin:4,lacticidven:4)  ) Recent Results (from the past 240 hour(s))  Culture, blood (routine x 2)     Status: None   Collection Time: 01/29/17  4:24 PM  Result Value Ref Range Status   Specimen Description BLOOD BLOOD LEFT FOREARM  Final   Special Requests   Final    BOTTLES DRAWN AEROBIC AND ANAEROBIC Blood Culture adequate volume   Culture   Final    NO GROWTH 5 DAYS Performed at [facility], [place],  [place] [address]    Report Status 02/03/2017 FINAL  Final  Culture, blood (routine x 2)     Status: None   Collection Time: 01/29/17  4:44 PM  Result Value Ref Range Status   Specimen Description BLOOD RIGHT ARM  Final   Special Requests IN PEDIATRIC BOTTLE Blood Culture adequate volume  Final   Culture   Final    NO GROWTH 5 DAYS Performed at [facility], [place] [address], [place], [place] [address]    Report Status 02/03/2017 FINAL  Final  MRSA PCR Screening     Status: Abnormal   Collection Time: 01/29/17  7:58 PM  Result Value Ref Range Status   MRSA by PCR POSITIVE (A) NEGATIVE Final    Comment:        The GeneXpert MRSA Assay (FDA approved for NASAL specimens only), is one component of a comprehensive MRSA colonization surveillance program. It is not intended to diagnose MRSA infection nor to guide or monitor treatment for MRSA infections. RESULT CALLED TO, READ BACK BY AND VERIFIED WITH: BELMA CERIC,RN [id number] @ 1696 BY J SCOTTON   Body fluid culture     Status: None (Preliminary result)   Collection Time: 02/02/17  4:08 PM  Result Value Ref Range Status   Specimen Description PLEURAL LEFT  Final   Special Requests NONE  Final   Gram Stain   Final     FEW WBC PRESENT,BOTH PMN AND MONONUCLEAR NO ORGANISMS SEEN    Culture   Final    NO GROWTH 2 DAYS Performed at [facility], [address] [place], [place] [address]    Report Status PENDING  Incomplete         Radiology Studies: Dg Chest 1 View  Result Date: 02/02/2017 CLINICAL DATA:  Status post thoracentesis. EXAM: CHEST 1 VIEW COMPARISON:  Radiographs of January 31, 2017. FINDINGS: Stable cardiomediastinal silhouette. Atherosclerosis of thoracic aorta is noted. Sternotomy wires are noted. Left pleural effusion is significantly smaller status post thoracentesis. No pneumothorax is noted. Mild right basilar subsegmental atelectasis is noted. Bony thorax is unremarkable. IMPRESSION: Aortic atherosclerosis. Left pleural effusion significantly smaller status post thoracentesis. Mild right basilar subsegmental atelectasis. Electronically Signed   By: Marijo Conception, M.D.   On: 02/02/2017 16:22   US Thoracentesis Asp Pleural Space W/img Guide  Result Date: 02/02/2017 INDICATION: Alzheimer's dementia, chronic kidney disease, dysphagia, COPD, pneumonia, large left pleural effusion. Request made for diagnostic and therapeutic left thoracentesis. EXAM: ULTRASOUND GUIDED DIAGNOSTIC AND THERAPEUTIC LEFT THORACENTESIS MEDICATIONS: None. COMPLICATIONS: None immediate. PROCEDURE: An ultrasound guided thoracentesis was thoroughly discussed with the patient and questions answered. The benefits, risks, alternatives and complications were also discussed. The patient understands and wishes to proceed with the procedure. Written consent was obtained. Ultrasound was performed to localize and mark an adequate pocket of fluid in the left chest. The area was then prepped and draped in the normal sterile fashion. 1% Lidocaine was used for local anesthesia. Under ultrasound guidance a Safe-T-Centesis catheter was introduced. Thoracentesis was performed. The catheter was removed and a dressing applied.  FINDINGS: A total of approximately 1.5 liters of bloody fluid was removed. Samples were sent to the laboratory as requested by the clinical team. Due to this being patient's initial thoracentesis only the above amount of fluid was removed today. IMPRESSION: Successful ultrasound guided diagnostic and therapeutic left thoracentesis yielding 1.5 liters of pleural fluid. Dr. Broadus John was notified of the above findings. Read by: Rowe Robert, PA-C  Electronically Signed   By: Sandi Mariscal M.D.   On: 02/02/2017 16:19        Scheduled Meds: . albuterol  2.5 mg Nebulization BID  . donepezil  5 mg Oral QHS  . DULoxetine  20 mg Oral Q breakfast  . famotidine  20 mg Oral QHS  . fludrocortisone  0.1 mg Oral Q breakfast  . insulin aspart  0-9 Units Subcutaneous TID WC  . insulin glargine  12 Units Subcutaneous QHS  . memantine  28 mg Oral Q breakfast  . metoprolol succinate  25 mg Oral Q breakfast  . nystatin  5 mL Oral TID WC & HS  . pantoprazole  40 mg Oral BID  . polyvinyl alcohol  1 drop Both Eyes TID WC  . pravastatin  40 mg Oral Q breakfast  . umeclidinium-vilanterol  1 puff Inhalation Daily   Continuous Infusions: . dextrose 50 mL/hr at 02/04/17 1257  . piperacillin-tazobactam (ZOSYN)  IV 2.25 g (02/04/17 1257)     LOS: 6 days    Time spent: 98min    Niel Hummer, MD [facility] Pager [phone]  If 7PM-7AM, please contact night-coverage [email or web address] Password [facility] 02/04/2017, 2:47 PM

## 2017-02-04 NOTE — Progress Notes (Signed)
Daily Progress Note   Patient Name: Leon Sharp       Date: 02/04/2017 DOB: Oct 31, 1921  Age: 83 y.o. MRN#: [id number] Attending Physician: Elmarie Shiley, MD Primary Care Physician: Jefm Petty, MD Admit Date: 01/29/2017  Reason for Consultation/Follow-up: Establishing goals of care  Subjective: Patient opens eyes to tactile stimulation, but much more lethargic than last evening.  Daughter is at bedside.  We discussed options moving forward for care including transition to comfort vs watchful waiting vs aggressive workup.   See below:   Length of Stay: 6  Current Medications: Scheduled Meds:  . albuterol  2.5 mg Nebulization BID  . donepezil  5 mg Oral QHS  . DULoxetine  20 mg Oral Q breakfast  . famotidine  20 mg Oral QHS  . fludrocortisone  0.1 mg Oral Q breakfast  . insulin aspart  0-9 Units Subcutaneous TID WC  . insulin glargine  12 Units Subcutaneous QHS  . memantine  28 mg Oral Q breakfast  . metoprolol succinate  25 mg Oral Q breakfast  . nystatin  5 mL Oral TID WC & HS  . pantoprazole  40 mg Oral BID  . polyvinyl alcohol  1 drop Both Eyes TID WC  . pravastatin  40 mg Oral Q breakfast  . umeclidinium-vilanterol  1 puff Inhalation Daily    Continuous Infusions: . dextrose 50 mL/hr at 02/04/17 1257  . piperacillin-tazobactam (ZOSYN)  IV 2.25 g (02/04/17 1257)    PRN Meds: acetaminophen, ondansetron **OR** ondansetron (ZOFRAN) IV, RESOURCE THICKENUP CLEAR  Physical Exam         Weak frail elderly person Lethargic, nonverbal today Shallow regular breath sounds More congested this am.  Diminished L side S1 S2 Abdomen soft Has thin extremities  Vital Signs: BP (!) 121/52 (BP Location: Left Arm)   Pulse 84   Temp 98 F (36.7 C) (Axillary)   Resp  18   Ht 5\' 10"  (1.778 m)   Wt 72.6 kg (160 lb)   SpO2 100%   BMI 22.96 kg/m  SpO2: SpO2: 100 % O2 Device: O2 Device: Nasal Cannula O2 Flow Rate: O2 Flow Rate (L/min): 3 L/min  Intake/output summary:   Intake/Output Summary (Last 24 hours) at 02/04/17 1428 Last data filed at 02/04/17 0900  Gross per 24 hour  Intake  150 ml  Output              125 ml  Net               25 ml   LBM: Last BM Date: 02/02/17 Baseline Weight: Weight: 72.6 kg (160 lb) Most recent weight: Weight: 72.6 kg (160 lb)       Palliative Assessment/Data:    Flowsheet Rows     Most Recent Value  Intake Tab  Referral Department  Hospitalist  Unit at Time of Referral  Med/Surg Unit  Palliative Care Primary Diagnosis  Other (Comment) [dementia, dysphagia, asp pna, para pneumonic effusion. ]  Palliative Care Type  New Palliative care  Reason for referral  Clarify Goals of Care, Counsel Regarding Hospice  Date first seen by Palliative Care  01/30/17  Clinical Assessment  Palliative Performance Scale Score  10%  Pain Max last 24 hours  4  Pain Min Last 24 hours  3  Dyspnea Max Last 24 Hours  3  Dyspnea Min Last 24 hours  2  Nausea Max Last 24 Hours  3  Nausea Min Last 24 Hours  2  Anxiety Max Last 24 Hours  3  Anxiety Min Last 24 Hours  2  Psychosocial & Spiritual Assessment  Palliative Care Outcomes  Patient/Family meeting held?  Yes  Who was at the meeting?  [place] daughter, patient was not very responsive.   Palliative Care Outcomes  Clarified goals of care      Patient Active Problem List   Diagnosis Date Noted  . Pressure injury of skin 02/04/2017  . HCAP (healthcare-associated pneumonia)   . Encounter for palliative care   . Goals of care, counseling/discussion   . Aspiration pneumonia ([place]) 01/29/2017  . Pleural effusion 01/29/2017  . Dysphagia 01/29/2017  . Dementia 01/29/2017  . COPD with emphysema ([place]) 01/29/2017  . CAD in native artery 01/29/2017  . AKI  (acute kidney injury) ([place]) 01/29/2017  . Leg wound, right 12/05/2015  . Fall 05/17/2011  . Subdural hematoma ([place]) 05/15/2011  . Diabetes mellitus ([place]) 05/15/2011  . Hypertension 05/15/2011  . Syncope 05/15/2011    Palliative Care Assessment & Plan   Patient Profile:    Assessment: Dementia, wheel chair bound at baseline.  Severe dysphagia PNA: asp Worsening AKI Underlying at least stage III CKD  underlying COPD, DM  Recommendations/Plan:  Discussed plan moving forward with daughter.  She would like to continue with gentle medical therapies and workup for acute change in mental status.  Discussed plan for continue abx, restart fluids, repeat CXR, and CT head. She declined to pursue ABG.  Plan to meet again tomorrow and reassess situation, but we did begin to discuss that hospice may be a possibility once again.  D/w Dr. Tyrell Antonio  Goals of care and disposition planning:  He was started on diet for comfort yesterday. Much more lethargic today.  Plan for workup and treatment of potentially reversible causes as above.  Will reassess again tomorrow. Continue to monitor PO intake, renal function, urine output,      Code Status:    Code Status Orders        Start     Ordered   01/29/17 1948  Do not attempt resuscitation (DNR)  Continuous    Question Answer Comment  In the event of cardiac or respiratory ARREST Do not call a "code blue"   In the event of cardiac or respiratory ARREST Do not perform Intubation, CPR,  defibrillation or ACLS   In the event of cardiac or respiratory ARREST Use medication by any route, position, wound care, and other measures to relive pain and suffering. May use oxygen, suction and manual treatment of airway obstruction as needed for comfort.      01/29/17 1947    Code Status History    Date Active Date Inactive Code Status Order ID Comments User Context   05/15/2011 11:37 PM 05/19/2011  8:03 PM Full Code [id number]  Rise Patience, MD  Inpatient    Advance Directive Documentation     Most Recent Value  Type of Advance Directive  Healthcare Power of Attorney, Living will  Pre-existing out of facility DNR order (yellow form or pink MOST form)  -  "MOST" Form in Place?  -       Prognosis:   guarded   Discharge Planning:  Likely SNF with palliative vs residential hospice  Care plan was discussed with  Daughter  Thank you for allowing the Palliative Medicine Team to assist in the care of this patient.  Total time: 40 minutes    Greater than 50%  of this time was spent counseling and coordinating care related to the above assessment and plan.  Micheline Rough, MD Please contact Palliative Medicine Team phone at [phone] for questions and concerns.

## 2017-02-04 NOTE — Progress Notes (Addendum)
CSW following as pt is admitted from facility- [facility].  Met with family who reports that pt has not progressed well during hospital stay and they are now wanting to pursue SNF with palliative care at [place] rather than returning to ALF.  PT evaluation is pending. Discussed with family that, as pt uses wheelchair at baseline, it is unlikely he will be deemed able to participate in [place] rehab. Family understanding however "want to give it a chance for him to try, he went to rehab before and even though he couldn't move around much he still progressed a litte." Discussed possibiliy that, should SNF [facility] rehab bed be located and pt transferred, there is the possibility Medicare will stop covering stay if pt unable to participate. Family understands and is agreeable to this possibility as well, states pt has long term care insurance and they would look into using that should this scenario unfold. Family interested in [facility] or [facility] if available. Will follow and refer following PT evaluation.   Sharren Bridge, MSW, LCSW Clinical Social Work 02/04/2017 [phone]  11:15- Referrals made. Will follow up with family for bed offers.

## 2017-02-04 NOTE — NC FL2 (Signed)
[place] LEVEL OF CARE SCREENING TOOL     IDENTIFICATION  Patient Name: Leon Sharp Birthdate: 1922-06-23 Sex: male Admission Date (Current Location): 01/29/2017  [facility] and [place] Number:  Herbalist and Address:  [facility],  [place] [address], [place]      Provider Number: [id number]  Attending Physician Name and Address:  Elmarie Shiley, MD  Relative Name and Phone Number:       Current Level of Care: Hospital Recommended Level of Care: [place] Prior Approval Number:    Date Approved/Denied:   PASRR Number: [id number]  Discharge Plan: SNF    Current Diagnoses: Patient Active Problem List   Diagnosis Date Noted  . HCAP (healthcare-associated pneumonia)   . Encounter for palliative care   . Goals of care, counseling/discussion   . Aspiration pneumonia ([place]) 01/29/2017  . Pleural effusion 01/29/2017  . Dysphagia 01/29/2017  . Dementia 01/29/2017  . COPD with emphysema ([place]) 01/29/2017  . CAD in native artery 01/29/2017  . AKI (acute kidney injury) ([place]) 01/29/2017  . Leg wound, right 12/05/2015  . Fall 05/17/2011  . Subdural hematoma ([place]) 05/15/2011  . Diabetes mellitus ([place]) 05/15/2011  . Hypertension 05/15/2011  . Syncope 05/15/2011    Orientation RESPIRATION BLADDER Height & Weight     Self, Place  O2 (3L) Continent, External catheter, Incontinent Weight: 160 lb (72.6 kg) Height:  5\' 10"  (177.8 cm)  BEHAVIORAL SYMPTOMS/MOOD NEUROLOGICAL BOWEL NUTRITION STATUS      Incontinent Diet (dysphagia III, Fluid consistency thin)  AMBULATORY STATUS COMMUNICATION OF NEEDS Skin   Extensive Assist Verbally PU Stage and Appropriate Care  pressure injury stage II, buttocks, foam dressing changes                     Personal Care Assistance Level of Assistance  Bathing, Feeding, Dressing Bathing Assistance: Limited assistance Feeding assistance: Independent Dressing Assistance:  Limited assistance     Functional Limitations Info  Sight, Hearing, Speech Sight Info: Adequate Hearing Info: Adequate Speech Info: Adequate    SPECIAL CARE FACTORS FREQUENCY  PT (By licensed PT)  Speech therapy     PT Frequency: 5x    Speech therapy frequency: 1x week          Contractures Contractures Info: Not present    Additional Factors Info  Code Status, Allergies, Isolation Precautions Code Status Info: DNR Allergies Info: NKA     Isolation Precautions Info: contact precautions- MRSA     Current Medications (02/04/2017):  This is the current hospital active medication list Current Facility-Administered Medications  Medication Dose Route Frequency Provider Last Rate Last Dose  . acetaminophen (TYLENOL) tablet 650 mg  650 mg Oral Q8H PRN Domenic Polite, MD      . albuterol (PROVENTIL) (2.5 MG/3ML) 0.083% nebulizer solution 2.5 mg  2.5 mg Nebulization BID Domenic Polite, MD   2.5 mg at 02/04/17 5852  . donepezil (ARICEPT) tablet 5 mg  5 mg Oral QHS Domenic Polite, MD   5 mg at 02/02/17 2156  . DULoxetine (CYMBALTA) DR capsule 20 mg  20 mg Oral Q breakfast Domenic Polite, MD   20 mg at 02/03/17 0859  . famotidine (PEPCID) tablet 20 mg  20 mg Oral QHS Dara Hoyer, RPH   20 mg at 02/02/17 2200  . fludrocortisone (FLORINEF) tablet 0.1 mg  0.1 mg Oral Q breakfast Domenic Polite, MD   0.1 mg at 02/03/17 0859  . insulin  glargine (LANTUS) injection 12 Units  12 Units Subcutaneous QHS Domenic Polite, MD   12 Units at 02/03/17 2259  . memantine (NAMENDA XR) 24 hr capsule 28 mg  28 mg Oral Q breakfast Domenic Polite, MD   28 mg at 02/03/17 0859  . metoprolol succinate (TOPROL-XL) 24 hr tablet 25 mg  25 mg Oral Q breakfast Domenic Polite, MD   25 mg at 02/03/17 0859  . nystatin (MYCOSTATIN) 100000 UNIT/ML suspension 500,000 Units  5 mL Oral TID WC & HS Domenic Polite, MD   500,000 Units at 02/03/17 1741  . ondansetron (ZOFRAN) tablet 4 mg  4 mg Oral Q6H PRN Domenic Polite, MD       Or  . ondansetron [facility]) injection 4 mg  4 mg Intravenous Q6H PRN Domenic Polite, MD      . pantoprazole (PROTONIX) EC tablet 40 mg  40 mg Oral BID Domenic Polite, MD   40 mg at 02/03/17 1020  . piperacillin-tazobactam (ZOSYN) IVPB 2.25 g  2.25 g Intravenous Q6H Berton Mount, RPH   Stopped at 02/04/17 0645  . polyvinyl alcohol (LIQUIFILM TEARS) 1.4 % ophthalmic solution 1 drop  1 drop Both Eyes TID WC Zeigler, Sandi Mealy, RPH   1 drop at 02/03/17 1740  . pravastatin (PRAVACHOL) tablet 40 mg  40 mg Oral Q breakfast Domenic Polite, MD   40 mg at 02/03/17 0859  . RESOURCE THICKENUP CLEAR   Oral PRN Domenic Polite, MD      . umeclidinium-vilanterol [facility] ELLIPTA) 62.5-25 MCG/INH 1 puff  1 puff Inhalation Daily Domenic Polite, MD         Discharge Medications: Please see discharge summary for a list of discharge medications.  Relevant Imaging Results:  Relevant Lab Results:   Additional Information SS# [id number]. Needs palliative care to follow at facility  [facility], LCSW

## 2017-02-04 NOTE — Progress Notes (Signed)
Physical Therapy Evaluation Patient Details Name: Leon Sharp MRN: [id number] DOB: 07/12/1921 Today's Date: 02/04/2017   History of Present Illness  Pt is a 83 y.o. male with PMHx for dementia, debility/wheelchair-bound, COPD, CAD with numerous stents, severe dysphagia, history of congestive heart failure, and chronic kidney disease stage III. Pt was diagnosed and admitted with aspiration pneumonia.  Clinical Impression  Pt admitted with the above conditions. Pt currently with functional limitations due to the deficits listed below. Pt will benefit from skilled PT to increase their independence and safety with mobility to allow discharge. Pt lethargic today. Pt was sat up at EOB with total assist and would open eyes briefly but was limited in session today.     Follow Up Recommendations SNF;Supervision/Assistance - 24 hour    Equipment Recommendations  None recommended by PT    Recommendations for Other Services       Precautions / Restrictions Precautions Precautions: Fall Restrictions Weight Bearing Restrictions: No      Mobility  Bed Mobility Overal bed mobility: Needs Assistance Bed Mobility: Supine to Sit;Sit to Supine     Supine to sit: Total assist;HOB elevated Sit to supine: Total assist   General bed mobility comments: PT attempted bed mobility to sit at EOB and awaken pt, pt required total assist to move legs and trunk to EOB  Transfers                 General transfer comment: Not attempted at this time   Ambulation/Gait                Stairs            Wheelchair Mobility    Modified Rankin (Stroke Patients Only)       Balance Overall balance assessment: Needs assistance Sitting-balance support: Feet supported;No upper extremity supported Sitting balance-Leahy Scale: Poor Sitting balance - Comments: assist for trunk support, pt propped in forward flexion, briefly awakened and able to correct posture however quickly reverted  back to forward flexed posture                                     Pertinent Vitals/Pain Pain Assessment: No/denies pain (Pt lethargic, did not appear in distress)    Home Living Family/patient expects to be discharged to:: Assisted living               Home Equipment: Wheelchair - manual;Walker - standard      Prior Function Level of Independence: Needs assistance         Comments: Pt was lethargic and unresponsive with no family present to account for prior level of function     Hand Dominance        Extremity/Trunk Assessment   Upper Extremity Assessment Upper Extremity Assessment: Generalized weakness;Difficult to assess due to impaired cognition    Lower Extremity Assessment Lower Extremity Assessment: Generalized weakness;Difficult to assess due to impaired cognition       Communication   Communication: HOH  Cognition Arousal/Alertness: Lethargic Behavior During Therapy: Flat affect Overall Cognitive Status: History of cognitive impairments - at baseline                                 General Comments: Pt with PMH of dementia, pt was lethargic throughout session, briefly able to open eyes, noncommunicative  General Comments      Exercises     Assessment/Plan    PT Assessment Patient needs continued PT services  PT Problem List Decreased mobility;Decreased safety awareness;Decreased activity tolerance;Decreased cognition;Decreased balance;Decreased knowledge of use of DME;Decreased strength       PT Treatment Interventions Functional mobility training;Neuromuscular re-education;Wheelchair mobility training;Balance training;Therapeutic exercise;Patient/family education;Therapeutic activities    PT Goals (Current goals can be found in the Care Plan section)  Acute Rehab PT Goals PT Goal Formulation: Patient unable to participate in goal setting Time For Goal Achievement: 02/18/17 Potential to Achieve Goals:  Poor    Frequency Min 2X/week   Barriers to discharge        Co-evaluation               AM-PAC PT "6 Clicks" Daily Activity  Outcome Measure Difficulty turning over in bed (including adjusting bedclothes, sheets and blankets)?: Total Difficulty moving from lying on back to sitting on the side of the bed? : Total Difficulty sitting down on and standing up from a chair with arms (e.g., wheelchair, bedside commode, etc,.)?: Total Help needed moving to and from a bed to chair (including a wheelchair)?: Total Help needed walking in hospital room?: Total Help needed climbing 3-5 steps with a railing? : Total 6 Click Score: 6    End of Session Equipment Utilized During Treatment: Oxygen Activity Tolerance: Patient limited by lethargy Patient left: in bed;with bed alarm set;with call bell/phone within reach Nurse Communication: Mobility status PT Visit Diagnosis: Muscle weakness (generalized) (M62.81)    Time: 4801-6553 PT Time Calculation (min) (ACUTE ONLY): 20 min   Charges:   PT Evaluation $PT Eval Low Complexity: 1 Low     PT G CodesOlegario Shearer, SPT   Reino Bellis 02/04/2017, 1:17 PM

## 2017-02-05 LAB — BASIC METABOLIC PANEL
ANION GAP: 6 (ref 5–15)
BUN: 28 mg/dL — AB (ref 6–20)
CHLORIDE: 110 mmol/L (ref 101–111)
CO2: 33 mmol/L — AB (ref 22–32)
Calcium: 8.1 mg/dL — ABNORMAL LOW (ref 8.9–10.3)
Creatinine, Ser: 1.96 mg/dL — ABNORMAL HIGH (ref 0.61–1.24)
GFR calc Af Amer: 32 mL/min — ABNORMAL LOW (ref >60)
GFR calc non Af Amer: 28 mL/min — ABNORMAL LOW (ref >60)
GLUCOSE: 147 mg/dL — AB (ref 65–99)
POTASSIUM: 3.4 mmol/L — AB (ref 3.5–5.1)
Sodium: 149 mmol/L — ABNORMAL HIGH (ref 135–145)

## 2017-02-05 LAB — CBC
HEMATOCRIT: 28.3 % — AB (ref 39.0–52.0)
HEMOGLOBIN: 8.8 g/dL — AB (ref 13.0–17.0)
MCH: 31.3 pg (ref 26.0–34.0)
MCHC: 31.1 g/dL (ref 30.0–36.0)
MCV: 100.7 fL — AB (ref 78.0–100.0)
PLATELETS: 156 10*3/uL (ref 150–400)
RBC: 2.81 MIL/uL — AB (ref 4.22–5.81)
RDW: 15.3 % (ref 11.5–15.5)
WBC: 7.5 10*3/uL (ref 4.0–10.5)

## 2017-02-05 LAB — GLUCOSE, CAPILLARY
GLUCOSE-CAPILLARY: 151 mg/dL — AB (ref 65–99)
GLUCOSE-CAPILLARY: 142 mg/dL — AB (ref 65–99)
Glucose-Capillary: 110 mg/dL — ABNORMAL HIGH (ref 65–99)
Glucose-Capillary: 124 mg/dL — ABNORMAL HIGH (ref 65–99)

## 2017-02-05 MED ORDER — IPRATROPIUM-ALBUTEROL 0.5-2.5 (3) MG/3ML IN SOLN
3.0000 mL | Freq: Four times a day (QID) | RESPIRATORY_TRACT | Status: DC
  Administered 2017-02-05 (×2): 3 mL via RESPIRATORY_TRACT
  Filled 2017-02-05 (×2): qty 3

## 2017-02-05 MED ORDER — POTASSIUM CHLORIDE CRYS ER 20 MEQ PO TBCR: 20.0000 meq | EXTENDED_RELEASE_TABLET | Freq: Once | ORAL | Status: DC

## 2017-02-05 MED ORDER — PIPERACILLIN-TAZOBACTAM 3.375 G IVPB
3.3750 g | Freq: Three times a day (TID) | INTRAVENOUS | Status: DC
  Administered 2017-02-05 – 2017-02-09 (×12): 3.375 g via INTRAVENOUS
  Filled 2017-02-05 (×12): qty 50

## 2017-02-05 MED ORDER — IPRATROPIUM-ALBUTEROL 0.5-2.5 (3) MG/3ML IN SOLN
3.0000 mL | Freq: Three times a day (TID) | RESPIRATORY_TRACT | Status: DC
  Administered 2017-02-06 – 2017-02-07 (×4): 3 mL via RESPIRATORY_TRACT
  Filled 2017-02-05 (×4): qty 3

## 2017-02-05 MED ORDER — POTASSIUM CHLORIDE 10 MEQ/100ML IV SOLN
10.0000 meq | INTRAVENOUS | Status: AC
  Administered 2017-02-05 (×2): 10 meq via INTRAVENOUS
  Filled 2017-02-05 (×2): qty 100

## 2017-02-05 NOTE — Progress Notes (Signed)
Daily Progress Note   Patient Name: Leon Sharp       Date: 02/05/2017 DOB: 03-18-1922  Age: 83 y.o. MRN#: [id number] Attending Physician: Elmarie Shiley, MD Primary Care Physician: Jefm Petty, MD Admit Date: 01/29/2017  Reason for Consultation/Follow-up: Establishing goals of care  Subjective: Patient opens eyes briefly with verbal stimulation but remains very sleepy.  Daughter and son at bedside.  He remains very sleepy today.  He did wake up enough to have 1/2 the contained of ice cream on his tray, but that is all he had today.  His daughter reports that this is how he was prior to waking up last weekend.  We discussed options moving forward for care including transition to comfort and pursuing hospice if he is not improved tomorrow (which will have been completion of 7 days of abx).   See below:   Length of Stay: 7  Current Medications: Scheduled Meds:  . donepezil  5 mg Oral QHS  . DULoxetine  20 mg Oral Q breakfast  . fludrocortisone  0.1 mg Oral Q breakfast  . insulin aspart  0-9 Units Subcutaneous TID WC  . ipratropium-albuterol  3 mL Nebulization Q6H  . memantine  28 mg Oral Q breakfast  . nystatin  5 mL Oral TID WC & HS  . pantoprazole  40 mg Oral BID  . polyvinyl alcohol  1 drop Both Eyes TID WC    Continuous Infusions: . dextrose 75 mL/hr at 02/05/17 1348  . piperacillin-tazobactam (ZOSYN)  IV      PRN Meds: acetaminophen, ondansetron **OR** ondansetron (ZOFRAN) IV, RESOURCE THICKENUP CLEAR  Physical Exam         Weak frail elderly person Lethargic, responds to questions with 1-2 word answers Shallow regular breath sounds.  Diminished L side S1 S2 Abdomen soft Has thin extremities  Vital Signs: BP (!) 119/55 (BP Location: Right Arm)   Pulse 86    Temp 97.7 F (36.5 C) (Axillary)   Resp 15   Ht 5\' 10"  (1.778 m)   Wt 72.6 kg (160 lb)   SpO2 98%   BMI 22.96 kg/m  SpO2: SpO2: 98 % O2 Device: O2 Device: Nasal Cannula O2 Flow Rate: O2 Flow Rate (L/min): 2 L/min  Intake/output summary:   Intake/Output Summary (Last 24 hours) at 02/05/17 1359 Last  data filed at 02/05/17 0900  Gross per 24 hour  Intake              900 ml  Output              250 ml  Net              650 ml   LBM: Last BM Date: 02/02/17 Baseline Weight: Weight: 72.6 kg (160 lb) Most recent weight: Weight: 72.6 kg (160 lb)       Palliative Assessment/Data:    Flowsheet Rows     Most Recent Value  Intake Tab  Referral Department  Hospitalist  Unit at Time of Referral  Med/Surg Unit  Palliative Care Primary Diagnosis  Other (Comment) [dementia, dysphagia, asp pna, para pneumonic effusion. ]  Palliative Care Type  New Palliative care  Reason for referral  Clarify Goals of Care, Counsel Regarding Hospice  Date first seen by Palliative Care  01/30/17  Clinical Assessment  Palliative Performance Scale Score  10%  Pain Max last 24 hours  4  Pain Min Last 24 hours  3  Dyspnea Max Last 24 Hours  3  Dyspnea Min Last 24 hours  2  Nausea Max Last 24 Hours  3  Nausea Min Last 24 Hours  2  Anxiety Max Last 24 Hours  3  Anxiety Min Last 24 Hours  2  Psychosocial & Spiritual Assessment  Palliative Care Outcomes  Patient/Family meeting held?  Yes  Who was at the meeting?  [place] daughter, patient was not very responsive.   Palliative Care Outcomes  Clarified goals of care      Patient Active Problem List   Diagnosis Date Noted  . Pressure injury of skin 02/04/2017  . HCAP (healthcare-associated pneumonia)   . Encounter for palliative care   . Goals of care, counseling/discussion   . Aspiration pneumonia ([place]) 01/29/2017  . Pleural effusion 01/29/2017  . Dysphagia 01/29/2017  . Dementia 01/29/2017  . COPD with emphysema ([place]) 01/29/2017    . CAD in native artery 01/29/2017  . AKI (acute kidney injury) ([place]) 01/29/2017  . Leg wound, right 12/05/2015  . Fall 05/17/2011  . Subdural hematoma ([place]) 05/15/2011  . Diabetes mellitus ([place]) 05/15/2011  . Hypertension 05/15/2011  . Syncope 05/15/2011    Palliative Care Assessment & Plan   Patient Profile:    Assessment: Dementia, wheel chair bound at baseline.  Severe dysphagia PNA: asp Worsening AKI Underlying at least stage III CKD  underlying COPD, DM  Recommendations/Plan:  Discussed plan moving forward with son and daughter.  Family reports that he was in similar condition after admission, but then "woke up" for a couple of days before worsening again.  Family is open again to discussion of hospice, which we began today.  I think that they may want to pursue residential hospice tomorrow if his condition to not improve.  Plan to meet again tomorrow and reassess situation, and I encouraged them to consider visit to residential hospice facility as they consider options.   Goals of care and disposition planning:  He was started on diet for comfort. Remains lethargic today.  Workup was unremarkable.  He does not seem to be improving with continued supportive care and abx.  We discussed hospice today.  Plan for meeting again tomorrow to discuss potential residential hospice if he does not improve. Continue to monitor PO intake, renal function, urine output,      Code Status:    Code  Status Orders        Start     Ordered   01/29/17 1948  Do not attempt resuscitation (DNR)  Continuous    Question Answer Comment  In the event of cardiac or respiratory ARREST Do not call a "code blue"   In the event of cardiac or respiratory ARREST Do not perform Intubation, CPR, defibrillation or ACLS   In the event of cardiac or respiratory ARREST Use medication by any route, position, wound care, and other measures to relive pain and suffering. May use oxygen, suction and manual  treatment of airway obstruction as needed for comfort.      01/29/17 1947    Code Status History    Date Active Date Inactive Code Status Order ID Comments User Context   05/15/2011 11:37 PM 05/19/2011  8:03 PM Full Code [id number]  Rise Patience, MD Inpatient    Advance Directive Documentation     Most Recent Value  Type of Advance Directive  Healthcare Power of Attorney, Living will  Pre-existing out of facility DNR order (yellow form or pink MOST form)  -  "MOST" Form in Place?  -       Prognosis:   guarded   Discharge Planning:  Likely SNF with palliative vs residential hospice  Care plan was discussed with  Daughter and son  Thank you for allowing the Palliative Medicine Team to assist in the care of this patient.  Total time: 40 minutes    Greater than 50%  of this time was spent counseling and coordinating care related to the above assessment and plan.  Micheline Rough, MD Please contact Palliative Medicine Team phone at [phone] for questions and concerns.

## 2017-02-05 NOTE — Care Management Important Message (Signed)
Important Message  Patient Details  Name: SUMEDH SHINSATO MRN: [id number] Date of Birth: 1922/01/28   Medicare Important Message Given:  Yes    Kerin Salen 02/05/2017, 10:41 AMImportant Message  Patient Details  Name: RODRIGUS KILKER MRN: [id number] Date of Birth: 1922/04/29   Medicare Important Message Given:  Yes    Kerin Salen 02/05/2017, 10:41 AM

## 2017-02-05 NOTE — Progress Notes (Signed)
  Speech Language Pathology Treatment: Dysphagia  Patient Details Name: Leon Sharp MRN: [id number] DOB: 1922-06-20 Today's Date: 02/05/2017 Time: 1450-1505 SLP Time Calculation (min) (ACUTE ONLY): 15 min  Assessment / Plan / Recommendation Clinical Impression  Pt has been lethargic per family/notes over the last two days. Daughter reports continuing to desire diet for comfort - understanding aspiration risks including airway blockage, recurrent pneumonias, etc and not for nutritional support.  SLP provided oral care to pt due to his xerostomia and secretion retention in posterior cavity.  Pt as not alert to accept po intake during session.    Daughter reports pt consumed ice cream today when fully alert (1/2 container) with good tolerance.  She reports patient consumed mashed potatoes and milk two days ago with good tolerance.    SLP will sign off at this time as all information reviewed and family agrees to [place].  Daughter reports pt may go to hospice tomorrow if he does not improve.      HPI HPI: 83 yo male adm to [facility] with respiratory difficulties due to aspiration pneumonia.  PMH + for Parkinsons', demenia, MI GERD, HOH, HTN, skin cancer, arthritis, clotting d/o, esophageal dysphagia and oropharyngeal dysphagia.   Pt recently seen at [facility] for MBS on 01/13/2017 - recommendation for NPO vs puree/HTL if desire to feed pt with known risks.    Family determined to proceed with puree/honey diet with strategies including small bites/double swallows/alternating foods/drinks.  CXR finding concerning for potential left lower lobe pna. Swallow evaluation ordered at [facility].       SLP Plan  Continue with current plan of care       Recommendations  Diet recommendations: Dysphagia 3 (mechanical soft);Thin liquid (ground meats, extra gravy/sauce) Liquids provided via: Teaspoon;Cup (use teaspoon if pt coughing with intake ) Medication Administration:  (via iv or liquid format) Supervision:  Patient able to self feed;Full supervision/cueing for compensatory strategies Compensations: Small sips/bites;Slow rate;Multiple dry swallows after each bite/sip (INTAKE FOR COMFORT ONLY) Postural Changes and/or Swallow Maneuvers: Seated upright 90 degrees;Upright 30-60 min after meal                Oral Care Recommendations: Oral care QID Follow up Recommendations: Skilled Nursing facility SLP Visit Diagnosis: Dysphagia, oropharyngeal phase (R13.12) Plan: Continue with current plan of care       GO              Luanna Salk, [facility] SLP [place], [place] 02/05/2017, 3:18 PM

## 2017-02-05 NOTE — Progress Notes (Signed)
PROGRESS NOTE    Leon Sharp  WNI:[id number] DOB: 1922-05-17 DOA: 01/29/2017 PCP: Jefm Petty, MD  Brief Narrative:Leon Sharp is a 83 year old male with multiple medical problems most notable for dementia, debility/wheelchair-bound, COPD, CAD with numerous stents, severe dysphagia, history of congestive heart failure, chronic kidney disease stage III was diagnosed with aspiration pneumonia last Friday and was started on Augmentin then. He's had extensive workup for dysphagia, his most recent modified barium swallow was 2 weeks ago, where it was recommended to place a PEG tube, which was declined by his daughter and patient continued on a pured diet with honey thick liquids. He also had an endoscopy a week ago, he was noted to have a mild esophageal stricture which was dilated then. He was brought to the emergency room today due to increased shortness of breath, Workup in the ED notable for mild leukocytosis, hyponatremia, creatinine of 2.2, normal lactate, chest x-ray with left lower lobe pneumonia and moderate left pleural effusion. Palliative following, slight improvement, plan for DC to SNF with palliative care FU  Assessment & Plan:  Aspiration pneumonia (HCC) with large L effusion -Long-standing history of severe dysphagia  -Admitted, started on IV Zosyn, and Repeat chest x-ray 8/11 with worsening large left likely parapneumonic effusion -Long-standing history of dysphagia, failed modified barium swallow 2 weeks ago, speech therapist following but were unable to evaluate him due to depressed mentation -Appreciate palliative consult -s/p Therapeutic thoracentesis 8-13 for Palliative purposes and 1.5L of blood tinged fluid was drained.  -Continue IV Zosyn and discharged changed to oral Augmentin to complete 10 days of therapy. -more hypoxic and lethargic today. Will repeat chest x ray.   Acute encephalopathy; suspect related to general condition, infection, electrolytes  abnormalities,  Patient still lethargic.  Continue with IV fluids.  CT head, negative for acute stroke.  See note from Palliative care, no ABG<  Pleural effusion mildly increase in size.   Severe dysphagia -As above, SLP following He was started on comfort feeding 8-14, today he is more lethargic.   Alzheimer's dementia -resume Namenda   AKI on CKD3 with mild hypernatremia -Baseline creatinine around 1.4-1.5Holding Celebrex, -resume IV fluids.    DM -hold lantus.  -SSI   COPD with emphysema (HCC) -Stable, nebs when necessary    CAD in native artery -Stable, continue metoprolol, Plavix and statin  DVT prophylaxis: Heparin subcutaneous  Code Status: DO NOT RESUSCITATE  Family Communication:  care discussed with daughter, patient not improving, will await for palliative care meeting today.    Consultants:   Palliative medicine  Antimicrobials:   IV Zosyn since 8/9  Procedures -Thoracentesis 8/13, 1.5 L of blood-tinged fluid drained   Subjective: Lethargic, try to open eyes.    Objective: Vitals:   02/04/17 1223 02/04/17 2005 02/04/17 2137 02/05/17 0438  BP: (!) 121/52  (!) 137/59 (!) 119/55  Pulse: 84  88 86  Resp:   16 15  Temp: 98 F (36.7 C)  (!) 97.5 F (36.4 C) 97.7 F (36.5 C)  TempSrc: Axillary  Axillary Axillary  SpO2: 100% 100% 92% 98%  Weight:      Height:        Intake/Output Summary (Last 24 hours) at 02/05/17 1320 Last data filed at 02/05/17 0900  Gross per 24 hour  Intake              900 ml  Output              250 ml  Net  650 ml   Filed Weights   01/29/17 1304  Weight: 72.6 kg (160 lb)    Examination:  .Gen: cachetic, lethargic.  HEENT: No JVD Lungs: decreased breath sound CVS: S 1, S 2 RRR Abd: Soft, NT, ND Extremities: no cyanosis.  Skin: no new rashes  Data Reviewed:   CBC:  Recent Labs Lab 01/29/17 1322  02/01/17 0421 02/02/17 0435 02/03/17 0410 02/04/17 0413 02/05/17 0414  WBC 12.7*   < > 7.4 7.5 7.8 8.5 7.5  NEUTROABS 9.2*  --   --   --   --   --   --   HGB 11.0*  < > 10.3* 9.7* 8.9* 9.0* 8.8*  HCT 34.7*  < > 33.3* 31.7* 28.8* 28.0* 28.3*  MCV 98.0  < > 100.6* 98.8 99.0 100.0 100.7*  PLT 232  < > 173 163 167 148* 156  < > = values in this interval not displayed. Basic Metabolic Panel:  Recent Labs Lab 02/01/17 0421 02/02/17 0435 02/03/17 0410 02/04/17 0413 02/05/17 0414  NA 146* 148* 147* 148* 149*  K 3.7 3.8 3.9 3.8 3.4*  CL 108 112* 111 110 110  CO2 29 29 31  32 33*  GLUCOSE 135* 103* 171* 200* 147*  BUN 26* 27* 29* 30* 28*  CREATININE 2.17* 2.09* 1.99* 1.99* 1.96*  CALCIUM 8.0* 8.0* 7.9* 8.0* 8.1*   GFR: Estimated Creatinine Clearance: 24.7 mL/min (A) (by C-G formula based on SCr of 1.96 mg/dL (H)). Liver Function Tests: No results for input(s): AST, ALT, ALKPHOS, BILITOT, PROT, ALBUMIN in the last 168 hours. No results for input(s): LIPASE, AMYLASE in the last 168 hours. No results for input(s): AMMONIA in the last 168 hours. Coagulation Profile: No results for input(s): INR, PROTIME in the last 168 hours. Cardiac Enzymes: No results for input(s): CKTOTAL, CKMB, CKMBINDEX, TROPONINI in the last 168 hours. BNP (last 3 results) No results for input(s): PROBNP in the last 8760 hours. HbA1C: No results for input(s): HGBA1C in the last 72 hours. CBG:  Recent Labs Lab 02/04/17 1203 02/04/17 1658 02/04/17 2135 02/05/17 0726 02/05/17 1214  GLUCAP 143* 147* 155* 110* 124*   Lipid Profile: No results for input(s): CHOL, HDL, LDLCALC, TRIG, CHOLHDL, LDLDIRECT in the last 72 hours. Thyroid Function Tests: No results for input(s): TSH, T4TOTAL, FREET4, T3FREE, THYROIDAB in the last 72 hours. Anemia Panel: No results for input(s): VITAMINB12, FOLATE, FERRITIN, TIBC, IRON, RETICCTPCT in the last 72 hours. Urine analysis:    Component Value Date/Time   COLORURINE YELLOW 01/31/2017 0035   APPEARANCEUR CLOUDY (A) 01/31/2017 0035   LABSPEC 1.014  01/31/2017 0035   PHURINE 5.0 01/31/2017 0035   GLUCOSEU NEGATIVE 01/31/2017 0035   HGBUR LARGE (A) 01/31/2017 0035   BILIRUBINUR NEGATIVE 01/31/2017 0035   KETONESUR NEGATIVE 01/31/2017 0035   PROTEINUR 30 (A) 01/31/2017 0035   UROBILINOGEN 1.0 10/25/2013 2305   NITRITE NEGATIVE 01/31/2017 0035   LEUKOCYTESUR SMALL (A) 01/31/2017 0035   Sepsis Labs: @LABRCNTIP (procalcitonin:4,lacticidven:4)  ) Recent Results (from the past 240 hour(s))  Culture, blood (routine x 2)     Status: None   Collection Time: 01/29/17  4:24 PM  Result Value Ref Range Status   Specimen Description BLOOD BLOOD LEFT FOREARM  Final   Special Requests   Final    BOTTLES DRAWN AEROBIC AND ANAEROBIC Blood Culture adequate volume   Culture   Final    NO GROWTH 5 DAYS Performed at [facility], [place] [address], [place], [place], [place] [address]  Report Status 02/03/2017 FINAL  Final  Culture, blood (routine x 2)     Status: None   Collection Time: 01/29/17  4:44 PM  Result Value Ref Range Status   Specimen Description BLOOD RIGHT ARM  Final   Special Requests IN PEDIATRIC BOTTLE Blood Culture adequate volume  Final   Culture   Final    NO GROWTH 5 DAYS Performed at [facility], [address], [place], [place] [address]    Report Status 02/03/2017 FINAL  Final  MRSA PCR Screening     Status: Abnormal   Collection Time: 01/29/17  7:58 PM  Result Value Ref Range Status   MRSA by PCR POSITIVE (A) NEGATIVE Final    Comment:        The GeneXpert MRSA Assay (FDA approved for NASAL specimens only), is one component of a comprehensive MRSA colonization surveillance program. It is not intended to diagnose MRSA infection nor to guide or monitor treatment for MRSA infections. RESULT CALLED TO, READ BACK BY AND VERIFIED WITH: BELMA CERIC,RN [id number] @ [id number] BY J SCOTTON   Body fluid culture     Status: None (Preliminary result)   Collection Time: 02/02/17  4:08 PM  Result Value Ref Range Status    Specimen Description PLEURAL LEFT  Final   Special Requests NONE  Final   Gram Stain   Final    FEW WBC PRESENT,BOTH PMN AND MONONUCLEAR NO ORGANISMS SEEN    Culture   Final    NO GROWTH 3 DAYS Performed at [facility], [address] [place], [place] [address]    Report Status PENDING  Incomplete         Radiology Studies: Ct Head Wo Contrast  Result Date: 02/04/2017 CLINICAL DATA:  Altered mental status EXAM: CT HEAD WITHOUT CONTRAST TECHNIQUE: Contiguous axial images were obtained from the base of the skull through the vertex without intravenous contrast. COMPARISON:  Head CT 06/30/2016 FINDINGS: Brain: No mass lesion, intraparenchymal hemorrhage or extra-axial collection. No evidence of acute cortical infarct. There is periventricular hypoattenuation compatible with chronic microvascular disease. Old bilateral cerebellar infarcts. There is global atrophy, unchanged. Vascular: No hyperdense vessel sign. Atherosclerotic calcification of the vertebral and internal carotid arteries at the skull base. Skull: Normal visualized skull base, calvarium and extracranial soft tissues. Sinuses/Orbits: No sinus fluid levels or advanced mucosal thickening. No mastoid effusion. Normal orbits. IMPRESSION: Old bilateral cerebellar infarcts and chronic microvascular ischemia without acute abnormality. Unchanged global atrophy. Electronically Signed   By: Ulyses Jarred M.D.   On: 02/04/2017 21:54   Dg Chest Port 1 View  Result Date: 02/04/2017 CLINICAL DATA:  Unresponsive. History of COPD, coronary artery disease and CABG, diabetes, former smoker. EXAM: PORTABLE CHEST 1 VIEW COMPARISON:  Portable chest x-ray of February 02, 2017 FINDINGS: The lungs remain hypoinflated. The volume of pleural fluid on the left has increased. The interstitial markings within the left lung remain increased. The left hemidiaphragm remains obscured. The cardiac silhouette is enlarged. A right coronary artery stent is  visible. The pulmonary vascularity is not clearly engorged. There is calcification in the wall of the thoracic aorta. The sternal wires are grossly intact. There is no acute bony abnormality of the thorax. There is a stable curvilinear calcification in the right upper quadrant of the abdomen known to be related to the right adrenal gland. IMPRESSION: Increasing pleural effusion on the left. Persistent left basilar atelectasis or pneumonia. No overt pulmonary edema. Thoracic aortic atherosclerosis. Electronically Signed  By: David  Martinique M.D.   On: 02/04/2017 14:59        Scheduled Meds: . donepezil  5 mg Oral QHS  . DULoxetine  20 mg Oral Q breakfast  . fludrocortisone  0.1 mg Oral Q breakfast  . insulin aspart  0-9 Units Subcutaneous TID WC  . ipratropium-albuterol  3 mL Nebulization Q6H  . memantine  28 mg Oral Q breakfast  . nystatin  5 mL Oral TID WC & HS  . pantoprazole  40 mg Oral BID  . polyvinyl alcohol  1 drop Both Eyes TID WC   Continuous Infusions: . dextrose 75 mL/hr at 02/05/17 0639  . piperacillin-tazobactam (ZOSYN)  IV       LOS: 7 days    Time spent: 72min    Niel Hummer, MD [facility] Pager [phone]  If 7PM-7AM, please contact night-coverage [email or web address] Password [email or web address] 02/05/2017, 1:20 PM

## 2017-02-05 NOTE — Progress Notes (Signed)
Patient having missed beats on telemetry. Leon Sharp notified. Will continue to monitor.

## 2017-02-05 NOTE — Progress Notes (Signed)
Pharmacy Antibiotic Note  Leon Sharp is a 83 y.o. male admitted on 01/29/2017 with aspiration pneumonia with large pleural effusion.  Pharmacy has been consulted for zosyn dosing.  S/p thoracentesis 8/13.  Today, 02/05/2017:  D8/10 abx for aspiration  WBC stable WNL  Afebrile since admission  Plan:  Increase Zosyn to 3.375 g IV q8 hr by 4-hr infusion with improved renal function  If patient to remain admitted beyond today, consider transitioning to Augmentin as was planned for discharge (Recommend Augmentin 500 mg BID with last doses on 8/18)  Height: 5\' 10"  (177.8 cm) Weight: 160 lb (72.6 kg) IBW/kg (Calculated) : 73  Temp (24hrs), Avg:97.7 F (36.5 C), Min:97.5 F (36.4 C), Max:98 F (36.7 C)   Recent Labs Lab 01/29/17 1635  02/01/17 0421 02/02/17 0435 02/03/17 0410 02/04/17 0413 02/05/17 0414  WBC  --   < > 7.4 7.5 7.8 8.5 7.5  CREATININE  --   < > 2.17* 2.09* 1.99* 1.99* 1.96*  LATICACIDVEN 1.43  --   --   --   --   --   --   < > = values in this interval not displayed.  Estimated Creatinine Clearance: 24.7 mL/min (A) (by C-G formula based on SCr of 1.96 mg/dL (H)).    No Known Allergies  Antimicrobials this admission: 8/9 zosyn >> 8/9 vanc x 1   Dose adjustments this admission:    Microbiology results: 8/9 BCx: ngf 8/13 pleural fluid cx ngtd 8/9 MRSA screen POS   Thank you for allowing pharmacy to be a part of this patient's care.  Reuel Boom, PharmD, BCPS Pager: [phone] 02/05/2017, 9:53 AM

## 2017-02-05 NOTE — Clinical Social Work Note (Signed)
CSW attempted to contact family to discuss SNF choice, no answer, VM left with pt's dtr, awaiting callback.  CSW will continue to follow for DC planning.  Obie Kallenbach B. Shelda Pal, [place] Social Work Dept Weekend Social Worker [phone] 1:32 PM

## 2017-02-06 DIAGNOSIS — J439 Emphysema, unspecified: Secondary | ICD-10-CM

## 2017-02-06 LAB — CBC
HCT: 30.3 % — ABNORMAL LOW (ref 39.0–52.0)
Hemoglobin: 9.4 g/dL — ABNORMAL LOW (ref 13.0–17.0)
MCH: 30.6 pg (ref 26.0–34.0)
MCHC: 31 g/dL (ref 30.0–36.0)
MCV: 98.7 fL (ref 78.0–100.0)
PLATELETS: 155 10*3/uL (ref 150–400)
RBC: 3.07 MIL/uL — ABNORMAL LOW (ref 4.22–5.81)
RDW: 15.2 % (ref 11.5–15.5)
WBC: 7.5 10*3/uL (ref 4.0–10.5)

## 2017-02-06 LAB — BODY FLUID CULTURE: CULTURE: NO GROWTH

## 2017-02-06 LAB — GLUCOSE, CAPILLARY
Glucose-Capillary: 155 mg/dL — ABNORMAL HIGH (ref 65–99)
Glucose-Capillary: 198 mg/dL — ABNORMAL HIGH (ref 65–99)
Glucose-Capillary: 216 mg/dL — ABNORMAL HIGH (ref 65–99)
Glucose-Capillary: 220 mg/dL — ABNORMAL HIGH (ref 65–99)

## 2017-02-06 LAB — BASIC METABOLIC PANEL
Anion gap: 10 (ref 5–15)
BUN: 26 mg/dL — ABNORMAL HIGH (ref 6–20)
CHLORIDE: 107 mmol/L (ref 101–111)
CO2: 30 mmol/L (ref 22–32)
CREATININE: 2.11 mg/dL — AB (ref 0.61–1.24)
Calcium: 8.3 mg/dL — ABNORMAL LOW (ref 8.9–10.3)
GFR calc non Af Amer: 26 mL/min — ABNORMAL LOW (ref >60)
GFR, EST AFRICAN AMERICAN: 30 mL/min — AB (ref >60)
Glucose, Bld: 180 mg/dL — ABNORMAL HIGH (ref 65–99)
POTASSIUM: 3.9 mmol/L (ref 3.5–5.1)
SODIUM: 147 mmol/L — AB (ref 135–145)

## 2017-02-06 MED ORDER — ALBUTEROL SULFATE (2.5 MG/3ML) 0.083% IN NEBU
2.5000 mg | INHALATION_SOLUTION | RESPIRATORY_TRACT | Status: DC | PRN
  Administered 2017-02-07: 2.5 mg via RESPIRATORY_TRACT
  Filled 2017-02-06: qty 3

## 2017-02-06 NOTE — Progress Notes (Signed)
PROGRESS NOTE    Leon Sharp  YQM:[id number] DOB: 04-Mar-1922 DOA: 01/29/2017 PCP: Jefm Petty, MD  Brief Narrative:Leon Sharp is a 83 year old male with multiple medical problems most notable for dementia, debility/wheelchair-bound, COPD, CAD with numerous stents, severe dysphagia, history of congestive heart failure, chronic kidney disease stage III was diagnosed with aspiration pneumonia last Friday and was started on Augmentin then. He's had extensive workup for dysphagia, his most recent modified barium swallow was 2 weeks ago, where it was recommended to place a PEG tube, which was declined by his daughter and patient continued on a pured diet with honey thick liquids. He also had an endoscopy a week ago, he was noted to have a mild esophageal stricture which was dilated then. He was brought to the emergency room today due to increased shortness of breath, Workup in the ED notable for mild leukocytosis, hyponatremia, creatinine of 2.2, normal lactate, chest x-ray with left lower lobe pneumonia and moderate left pleural effusion. Palliative following, slight improvement, plan for DC to SNF with palliative care FU  Assessment & Plan:  Aspiration pneumonia (HCC) with large L effusion -Long-standing history of severe dysphagia  -Admitted, started on IV Zosyn, and Repeat chest x-ray 8/11 with worsening large left likely parapneumonic effusion -Long-standing history of dysphagia, failed modified barium swallow 2 weeks ago, speech therapist following but were unable to evaluate him due to depressed mentation -Appreciate palliative consult -s/p Therapeutic thoracentesis 8-13 for Palliative purposes and 1.5L of blood tinged fluid was drained.  -Continue IV Zosyn and discharged changed to oral Augmentin to complete 10 days of therapy. -Chest x ray with slightly increase moderate pleural effusion. Patient in no distress, oxygen sat 100 on 2 L. PRN chest x ray.   Acute encephalopathy;  Delirium; suspect related to general condition, infection, electrolytes abnormalities, delirium.  Continue with IV fluids.  CT head, negative for acute stroke.  See note from Palliative care, no ABG<  Pleural effusion mildly increase in size.  Patient more alert today. He was agitated per nurse this morning.   Severe dysphagia -As above, SLP following He was started on comfort feeding 8-14.   Alzheimer's dementia -resume Namenda   AKI on CKD3 with  hypernatremia -Baseline creatinine around 1.4-1.5. Holding Celebrex, -Continue with  IV fluids.  -sodium decreasing. Cr increases.  -bladder scan.     DM -hold lantus.  -SSI   COPD with emphysema (HCC) -Stable, nebs when necessary    CAD in native artery -Stable, continue metoprolol, Plavix and statin  DVT prophylaxis: Heparin subcutaneous  Code Status: DO NOT RESUSCITATE  Family Communication:  No family at bedside today.    Consultants:   Palliative medicine  Antimicrobials:   IV Zosyn since 8/9  Procedures -Thoracentesis 8/13, 1.5 L of blood-tinged fluid drained   Subjective: He is more alert, denies dyspnea.  Denies pain.    Objective: Vitals:   02/05/17 0438 02/05/17 1500 02/05/17 2119 02/06/17 0432  BP: (!) 119/55 (!) 115/57 (!) 132/58 123/63  Pulse: 86 95 95 95  Resp: 15 16 15 17   Temp: 97.7 F (36.5 C) 98.2 F (36.8 C) 98.2 F (36.8 C) 97.7 F (36.5 C)  TempSrc: Axillary Axillary Oral Oral  SpO2: 98% 93% 94% 100%  Weight:      Height:        Intake/Output Summary (Last 24 hours) at 02/06/17 1337 Last data filed at 02/06/17 1318  Gross per 24 hour  Intake  2241.25 ml  Output              150 ml  Net          2091.25 ml   Filed Weights   01/29/17 1304  Weight: 72.6 kg (160 lb)    Examination:  .Gen: Cachetic, more alert, eyes open , follows some command,  HEENT: No JVD Lungs: No work of breathing. Bilateral air movement.  CVS: S 1, S 2 RRR Abd: Soft, NT,  ND Extremities: No cyanosis.  Skin: no new rashes  Data Reviewed:   CBC:  Recent Labs Lab 02/02/17 0435 02/03/17 0410 02/04/17 0413 02/05/17 0414 02/06/17 0948  WBC 7.5 7.8 8.5 7.5 7.5  HGB 9.7* 8.9* 9.0* 8.8* 9.4*  HCT 31.7* 28.8* 28.0* 28.3* 30.3*  MCV 98.8 99.0 100.0 100.7* 98.7  PLT 163 167 148* 156 662   Basic Metabolic Panel:  Recent Labs Lab 02/02/17 0435 02/03/17 0410 02/04/17 0413 02/05/17 0414 02/06/17 0948  NA 148* 147* 148* 149* 147*  K 3.8 3.9 3.8 3.4* 3.9  CL 112* 111 110 110 107  CO2 29 31 32 33* 30  GLUCOSE 103* 171* 200* 147* 180*  BUN 27* 29* 30* 28* 26*  CREATININE 2.09* 1.99* 1.99* 1.96* 2.11*  CALCIUM 8.0* 7.9* 8.0* 8.1* 8.3*   GFR: Estimated Creatinine Clearance: 22.9 mL/min (A) (by C-G formula based on SCr of 2.11 mg/dL (H)). Liver Function Tests: No results for input(s): AST, ALT, ALKPHOS, BILITOT, PROT, ALBUMIN in the last 168 hours. No results for input(s): LIPASE, AMYLASE in the last 168 hours. No results for input(s): AMMONIA in the last 168 hours. Coagulation Profile: No results for input(s): INR, PROTIME in the last 168 hours. Cardiac Enzymes: No results for input(s): CKTOTAL, CKMB, CKMBINDEX, TROPONINI in the last 168 hours. BNP (last 3 results) No results for input(s): PROBNP in the last 8760 hours. HbA1C: No results for input(s): HGBA1C in the last 72 hours. CBG:  Recent Labs Lab 02/05/17 1214 02/05/17 1722 02/05/17 2117 02/06/17 0755 02/06/17 1156  GLUCAP 124* 151* 142* 155* 198*   Lipid Profile: No results for input(s): CHOL, HDL, LDLCALC, TRIG, CHOLHDL, LDLDIRECT in the last 72 hours. Thyroid Function Tests: No results for input(s): TSH, T4TOTAL, FREET4, T3FREE, THYROIDAB in the last 72 hours. Anemia Panel: No results for input(s): VITAMINB12, FOLATE, FERRITIN, TIBC, IRON, RETICCTPCT in the last 72 hours. Urine analysis:    Component Value Date/Time   COLORURINE YELLOW 01/31/2017 0035   APPEARANCEUR CLOUDY  (A) 01/31/2017 0035   LABSPEC 1.014 01/31/2017 0035   PHURINE 5.0 01/31/2017 0035   GLUCOSEU NEGATIVE 01/31/2017 0035   HGBUR LARGE (A) 01/31/2017 0035   BILIRUBINUR NEGATIVE 01/31/2017 0035   KETONESUR NEGATIVE 01/31/2017 0035   PROTEINUR 30 (A) 01/31/2017 0035   UROBILINOGEN 1.0 10/25/2013 2305   NITRITE NEGATIVE 01/31/2017 0035   LEUKOCYTESUR SMALL (A) 01/31/2017 0035   Sepsis Labs: @LABRCNTIP (procalcitonin:4,lacticidven:4)  ) Recent Results (from the past 240 hour(s))  Culture, blood (routine x 2)     Status: None   Collection Time: 01/29/17  4:24 PM  Result Value Ref Range Status   Specimen Description BLOOD BLOOD LEFT FOREARM  Final   Special Requests   Final    BOTTLES DRAWN AEROBIC AND ANAEROBIC Blood Culture adequate volume   Culture   Final    NO GROWTH 5 DAYS Performed at [facility], [place] [address] [place], [place] [address]    Report Status 02/03/2017 FINAL  Final  Culture,  blood (routine x 2)     Status: None   Collection Time: 01/29/17  4:44 PM  Result Value Ref Range Status   Specimen Description BLOOD RIGHT ARM  Final   Special Requests IN PEDIATRIC BOTTLE Blood Culture adequate volume  Final   Culture   Final    NO GROWTH 5 DAYS Performed at [facility], [address], [place], [place] [address]    Report Status 02/03/2017 FINAL  Final  MRSA PCR Screening     Status: Abnormal   Collection Time: 01/29/17  7:58 PM  Result Value Ref Range Status   MRSA by PCR POSITIVE (A) NEGATIVE Final    Comment:        The GeneXpert MRSA Assay (FDA approved for NASAL specimens only), is one component of a comprehensive MRSA colonization surveillance program. It is not intended to diagnose MRSA infection nor to guide or monitor treatment for MRSA infections. RESULT CALLED TO, READ BACK BY AND VERIFIED WITH: BELMA CERIC,RN [id number] @ [id number] BY J SCOTTON   Body fluid culture     Status: None   Collection Time: 02/02/17  4:08 PM  Result Value Ref  Range Status   Specimen Description PLEURAL LEFT  Final   Special Requests NONE  Final   Gram Stain   Final    FEW WBC PRESENT,BOTH PMN AND MONONUCLEAR NO ORGANISMS SEEN    Culture   Final    NO GROWTH 3 DAYS Performed at [facility], [address], [place], [place] [address]    Report Status 02/06/2017 FINAL  Final         Radiology Studies: Ct Head Wo Contrast  Result Date: 02/04/2017 CLINICAL DATA:  Altered mental status EXAM: CT HEAD WITHOUT CONTRAST TECHNIQUE: Contiguous axial images were obtained from the base of the skull through the vertex without intravenous contrast. COMPARISON:  Head CT 06/30/2016 FINDINGS: Brain: No mass lesion, intraparenchymal hemorrhage or extra-axial collection. No evidence of acute cortical infarct. There is periventricular hypoattenuation compatible with chronic microvascular disease. Old bilateral cerebellar infarcts. There is global atrophy, unchanged. Vascular: No hyperdense vessel sign. Atherosclerotic calcification of the vertebral and internal carotid arteries at the skull base. Skull: Normal visualized skull base, calvarium and extracranial soft tissues. Sinuses/Orbits: No sinus fluid levels or advanced mucosal thickening. No mastoid effusion. Normal orbits. IMPRESSION: Old bilateral cerebellar infarcts and chronic microvascular ischemia without acute abnormality. Unchanged global atrophy. Electronically Signed   By: Ulyses Jarred M.D.   On: 02/04/2017 21:54   Dg Chest Port 1 View  Result Date: 02/04/2017 CLINICAL DATA:  Unresponsive. History of COPD, coronary artery disease and CABG, diabetes, former smoker. EXAM: PORTABLE CHEST 1 VIEW COMPARISON:  Portable chest x-ray of February 02, 2017 FINDINGS: The lungs remain hypoinflated. The volume of pleural fluid on the left has increased. The interstitial markings within the left lung remain increased. The left hemidiaphragm remains obscured. The cardiac silhouette is enlarged. A right coronary  artery stent is visible. The pulmonary vascularity is not clearly engorged. There is calcification in the wall of the thoracic aorta. The sternal wires are grossly intact. There is no acute bony abnormality of the thorax. There is a stable curvilinear calcification in the right upper quadrant of the abdomen known to be related to the right adrenal gland. IMPRESSION: Increasing pleural effusion on the left. Persistent left basilar atelectasis or pneumonia. No overt pulmonary edema. Thoracic aortic atherosclerosis. Electronically Signed   By: David  Martinique M.D.  On: 02/04/2017 14:59        Scheduled Meds: . donepezil  5 mg Oral QHS  . DULoxetine  20 mg Oral Q breakfast  . fludrocortisone  0.1 mg Oral Q breakfast  . insulin aspart  0-9 Units Subcutaneous TID WC  . ipratropium-albuterol  3 mL Nebulization TID  . memantine  28 mg Oral Q breakfast  . nystatin  5 mL Oral TID WC & HS  . pantoprazole  40 mg Oral BID  . polyvinyl alcohol  1 drop Both Eyes TID WC   Continuous Infusions: . dextrose 75 mL/hr at 02/05/17 1348  . piperacillin-tazobactam (ZOSYN)  IV 3.375 g (02/06/17 1400)     LOS: 8 days    Time spent: 36  min    Niel Hummer, MD [facility] Pager [phone]  If 7PM-7AM, please contact night-coverage [email or web address] Password [email or web address] 02/06/2017, 1:37 PM

## 2017-02-06 NOTE — Progress Notes (Signed)
Pt not medically stable today per MD to discharge.

## 2017-02-06 NOTE — Progress Notes (Signed)
Physical Therapy Treatment Patient Details Name: Leon Sharp MRN: [id number] DOB: 1922-05-01 Today's Date: 02/06/2017    History of Present Illness Pt is a 83 y.o. male with PMHx for dementia, debility/wheelchair-bound, COPD, CAD with numerous stents, severe dysphagia, history of congestive heart failure, and chronic kidney disease stage III. Pt was diagnosed and admitted with aspiration pneumonia.    PT Comments    Pt more alert today.  Daughter at bedside. Pt found incont urine.   Assisted pt to sitting EOB.  Remained on 3 lts nasal O2.  Sats 94%.  Static sitting EOB x 7 min at Supervision assist.  Kyphotic posture.  Pt able to hold cup of ICE and self administer.  Mild c/o fatigue.  Assisted to recliner partial stand pivot sit 1/4 turn from elevated bed to recliner.  Assisted with hand transfer and turn completion.  Positioned in recliner to comfort.  Chair alarm activated.  Offered pt a cup of chocolate Ice Cream.  Pt able to self feed.    Follow Up Recommendations  SNF;Supervision/Assistance - 24 hour  pt is from [place] Recommendations  None recommended by PT    Recommendations for Other Services       Precautions / Restrictions Precautions Precautions: Fall Precaution Comments: monitor sats Restrictions Weight Bearing Restrictions: No    Mobility  Bed Mobility Overal bed mobility: Needs Assistance Bed Mobility: Supine to Sit     Supine to sit: Mod assist     General bed mobility comments: HOB elevated and increased time to assist B LE off bed and utilized bed pad to complete scooting to EOB.  Once upright, pt able to static sit x 7 min.    Transfers Overall transfer level: Needs assistance Equipment used: None Transfers: Sit to/from [facility] Sit to Stand: Min assist;Mod assist Stand pivot transfers: Min assist;Mod assist       General transfer comment: sit ti stand to pivot 1/4 turn from elevated bed to recliner  required Mod/Min Assist and 50% VC's on safety and turn completion.  Pt was able to support his own weight but unable to stand erect.  Forward flex posture and assist with hand transfer.    Ambulation/Gait             General Gait Details: because of multiple fallws, pt utilizes a standard wheelchair using B LE's to self propel to [employer], per daughter.   Stairs            Wheelchair Mobility    Modified Rankin (Stroke Patients Only)       Balance                                            Cognition Arousal/Alertness: Awake/alert Behavior During Therapy: WFL for tasks assessed/performed                                   General Comments: HOH with Hx dementia however following all commands and conversive      Exercises      General Comments        Pertinent Vitals/Pain Pain Assessment: No/denies pain    Home Living  Prior Function            PT Goals (current goals can now be found in the care plan section) Progress towards PT goals: Progressing toward goals    Frequency    Min 2X/week      PT Plan Current plan remains appropriate    Co-evaluation              AM-PAC PT "6 Clicks" Daily Activity  Outcome Measure  Difficulty turning over in bed (including adjusting bedclothes, sheets and blankets)?: A Lot Difficulty moving from lying on back to sitting on the side of the bed? : A Lot Difficulty sitting down on and standing up from a chair with arms (e.g., wheelchair, bedside commode, etc,.)?: A Lot Help needed moving to and from a bed to chair (including a wheelchair)?: A Lot Help needed walking in hospital room?: A Lot Help needed climbing 3-5 steps with a railing? : Total 6 Click Score: 11    End of Session Equipment Utilized During Treatment: Oxygen Activity Tolerance: Patient tolerated treatment well Patient left: in chair;with call bell/phone within reach;with  family/visitor present;with chair alarm set Nurse Communication: Mobility status PT Visit Diagnosis: Muscle weakness (generalized) (M62.81)     Time: 6283-6629 PT Time Calculation (min) (ACUTE ONLY): 26 min  Charges:  $Therapeutic Activity: 23-37 mins                    G Codes:       Rica Koyanagi  PTA [facility]  Acute  Rehab Pager      [phone]

## 2017-02-06 NOTE — Progress Notes (Signed)
Daily Progress Note   Patient Name: Leon Sharp       Date: 02/06/2017 DOB: 01-Nov-1921  Age: 83 y.o. MRN#: [id number] Attending Physician: Elmarie Shiley, MD Primary Care Physician: Jefm Petty, MD Admit Date: 01/29/2017  Reason for Consultation/Follow-up: Establishing goals of care  Subjective: Patient opens eyes briefly with verbal stimulation but remains very sleepy.  Daughter and son at bedside.  He is much more interactive and awake today.  He had some lunch off of his tray at lunchtime.  While I was in the room, he called out to nursing station and reported feeling more SOB.  I retitrated his O2 back to prior level of 3L and he reports SOB resolved.  See below:   Length of Stay: 8  Current Medications: Scheduled Meds:  . donepezil  5 mg Oral QHS  . DULoxetine  20 mg Oral Q breakfast  . fludrocortisone  0.1 mg Oral Q breakfast  . insulin aspart  0-9 Units Subcutaneous TID WC  . ipratropium-albuterol  3 mL Nebulization TID  . memantine  28 mg Oral Q breakfast  . nystatin  5 mL Oral TID WC & HS  . pantoprazole  40 mg Oral BID  . polyvinyl alcohol  1 drop Both Eyes TID WC    Continuous Infusions: . dextrose 75 mL/hr at 02/05/17 1348  . piperacillin-tazobactam (ZOSYN)  IV 3.375 g (02/06/17 1400)    PRN Meds: acetaminophen, albuterol, ondansetron **OR** ondansetron (ZOFRAN) IV, RESOURCE THICKENUP CLEAR  Physical Exam         Weak frail elderly person Lethargic, responds to questions with 1-2 word answers Shallow regular breath sounds. Crackles in bases S1 S2 Abdomen soft Has thin extremities  Vital Signs: BP 123/63 (BP Location: Right Arm)   Pulse 95   Temp 97.7 F (36.5 C) (Oral)   Resp 17   Ht 5\' 10"  (1.778 m)   Wt 72.6 kg (160 lb)   SpO2 95%   BMI  22.96 kg/m  SpO2: SpO2: 95 % O2 Device: O2 Device: Nasal Cannula O2 Flow Rate: O2 Flow Rate (L/min): 3 L/min  Intake/output summary:   Intake/Output Summary (Last 24 hours) at 02/06/17 1647 Last data filed at 02/06/17 1400  Gross per 24 hour  Intake          2823.75 ml  Output                0 ml  Net          2823.75 ml   LBM: Last BM Date: 02/02/17 Baseline Weight: Weight: 72.6 kg (160 lb) Most recent weight: Weight: 72.6 kg (160 lb)       Palliative Assessment/Data:    Flowsheet Rows     Most Recent Value  Intake Tab  Referral Department  Hospitalist  Unit at Time of Referral  Med/Surg Unit  Palliative Care Primary Diagnosis  Other (Comment) [dementia, dysphagia, asp pna, para pneumonic effusion. ]  Palliative Care Type  New Palliative care  Reason for referral  Clarify Goals of Care, Counsel Regarding Hospice  Date first seen by Palliative Care  01/30/17  Clinical Assessment  Palliative Performance Scale Score  10%  Pain Max last 24 hours  4  Pain Min Last 24 hours  3  Dyspnea Max Last 24 Hours  3  Dyspnea Min Last 24 hours  2  Nausea Max Last 24 Hours  3  Nausea Min Last 24 Hours  2  Anxiety Max Last 24 Hours  3  Anxiety Min Last 24 Hours  2  Psychosocial & Spiritual Assessment  Palliative Care Outcomes  Patient/Family meeting held?  Yes  Who was at the meeting?  [place] daughter, patient was not very responsive.   Palliative Care Outcomes  Clarified goals of care      Patient Active Problem List   Diagnosis Date Noted  . Pressure injury of skin 02/04/2017  . HCAP (healthcare-associated pneumonia)   . Encounter for palliative care   . Goals of care, counseling/discussion   . Aspiration pneumonia ([place]) 01/29/2017  . Pleural effusion 01/29/2017  . Dysphagia 01/29/2017  . Dementia 01/29/2017  . COPD with emphysema ([place]) 01/29/2017  . CAD in native artery 01/29/2017  . AKI (acute kidney injury) ([place]) 01/29/2017  . Leg wound, right 12/05/2015    . Fall 05/17/2011  . Subdural hematoma ([place]) 05/15/2011  . Diabetes mellitus ([place]) 05/15/2011  . Hypertension 05/15/2011  . Syncope 05/15/2011    Palliative Care Assessment & Plan   Patient Profile:    Assessment: Dementia, wheel chair bound at baseline.  Severe dysphagia PNA: asp Worsening AKI Underlying at least stage III CKD  underlying COPD, DM  Recommendations/Plan:  Discussed plan moving forward with son and daughter.  Mr. Taney is much more alert today.  Plan to continue course of antibiotics and reassess situation daily as his trajectory remains unclear at this time.   SOB:  Increased O2 back to 3L which he says fixed his SOB.  Plan for his scheduled duoneb.  Reports much better now.  Consider repeat CXR if SOB recurs.   Continue to monitor PO intake, renal function, urine output,      Code Status:    Code Status Orders        Start     Ordered   01/29/17 1948  Do not attempt resuscitation (DNR)  Continuous    Question Answer Comment  In the event of cardiac or respiratory ARREST Do not call a "code blue"   In the event of cardiac or respiratory ARREST Do not perform Intubation, CPR, defibrillation or ACLS   In the event of cardiac or respiratory ARREST Use medication by any route, position, wound care, and other measures to relive pain and suffering. May use oxygen, suction and manual treatment of airway obstruction as needed  for comfort.      01/29/17 1947    Code Status History    Date Active Date Inactive Code Status Order ID Comments User Context   05/15/2011 11:37 PM 05/19/2011  8:03 PM Full Code [id number]  Rise Patience, MD Inpatient    Advance Directive Documentation     Most Recent Value  Type of Advance Directive  Healthcare Power of Attorney, Living will  Pre-existing out of facility DNR order (yellow form or pink MOST form)  -  "MOST" Form in Place?  -       Prognosis:   guarded   Discharge Planning:  Likely SNF with  palliative vs residential hospice  Care plan was discussed with  Daughter and son  Thank you for allowing the Palliative Medicine Team to assist in the care of this patient.  Total time: 30 minutes    Greater than 50%  of this time was spent counseling and coordinating care related to the above assessment and plan.  Micheline Rough, MD Please contact Palliative Medicine Team phone at [phone] for questions and concerns.

## 2017-02-07 LAB — BASIC METABOLIC PANEL
Anion gap: 8 (ref 5–15)
BUN: 26 mg/dL — AB (ref 6–20)
CALCIUM: 7.9 mg/dL — AB (ref 8.9–10.3)
CO2: 30 mmol/L (ref 22–32)
CREATININE: 2.03 mg/dL — AB (ref 0.61–1.24)
Chloride: 106 mmol/L (ref 101–111)
GFR calc Af Amer: 31 mL/min — ABNORMAL LOW (ref >60)
GFR, EST NON AFRICAN AMERICAN: 27 mL/min — AB (ref >60)
Glucose, Bld: 250 mg/dL — ABNORMAL HIGH (ref 65–99)
POTASSIUM: 3.2 mmol/L — AB (ref 3.5–5.1)
SODIUM: 144 mmol/L (ref 135–145)

## 2017-02-07 LAB — GLUCOSE, CAPILLARY
Glucose-Capillary: 223 mg/dL — ABNORMAL HIGH (ref 65–99)
Glucose-Capillary: 220 mg/dL — ABNORMAL HIGH (ref 65–99)
Glucose-Capillary: 207 mg/dL — ABNORMAL HIGH (ref 65–99)
Glucose-Capillary: 227 mg/dL — ABNORMAL HIGH (ref 65–99)

## 2017-02-07 LAB — TSH: TSH: 1.427 u[IU]/mL (ref 0.350–4.500)

## 2017-02-07 LAB — VITAMIN B12: VITAMIN B 12: 753 pg/mL (ref 180–914)

## 2017-02-07 MED ORDER — IPRATROPIUM-ALBUTEROL 0.5-2.5 (3) MG/3ML IN SOLN
3.0000 mL | Freq: Four times a day (QID) | RESPIRATORY_TRACT | Status: DC
  Administered 2017-02-08 – 2017-02-09 (×7): 3 mL via RESPIRATORY_TRACT
  Filled 2017-02-07 (×7): qty 3

## 2017-02-07 MED ORDER — IPRATROPIUM-ALBUTEROL 0.5-2.5 (3) MG/3ML IN SOLN
3.0000 mL | Freq: Four times a day (QID) | RESPIRATORY_TRACT | Status: DC
  Administered 2017-02-07 (×2): 3 mL via RESPIRATORY_TRACT
  Filled 2017-02-07 (×2): qty 3

## 2017-02-07 MED ORDER — IPRATROPIUM-ALBUTEROL 0.5-2.5 (3) MG/3ML IN SOLN: 3.0000 mL | Freq: Three times a day (TID) | RESPIRATORY_TRACT | Status: DC

## 2017-02-07 MED ORDER — IPRATROPIUM-ALBUTEROL 0.5-2.5 (3) MG/3ML IN SOLN: 3.0000 mL | Freq: Four times a day (QID) | RESPIRATORY_TRACT | Status: DC | PRN

## 2017-02-07 MED ORDER — SODIUM CHLORIDE 0.45 % IV SOLN
INTRAVENOUS | Status: DC
  Administered 2017-02-07 – 2017-02-09 (×3): via INTRAVENOUS

## 2017-02-07 NOTE — Progress Notes (Signed)
Daily Progress Note   Patient Name: Leon Sharp       Date: 02/07/2017 DOB: 12-16-1921  Age: 83 y.o. MRN#: [id number] Attending Physician: Elmarie Shiley, MD Primary Care Physician: Jefm Petty, MD Admit Date: 01/29/2017  Reason for Consultation/Follow-up: Establishing goals of care  Subjective:. Daughter at bedside.  Mr. Weisenburger is awake and alert in bed and acknowledges me as I enter the room.  Still not with much intake, but his intake has been increasing each day.  Denies SOB currently.  See below:   Length of Stay: 9  Current Medications: Scheduled Meds:  . donepezil  5 mg Oral QHS  . DULoxetine  20 mg Oral Q breakfast  . fludrocortisone  0.1 mg Oral Q breakfast  . insulin aspart  0-9 Units Subcutaneous TID WC  . memantine  28 mg Oral Q breakfast  . nystatin  5 mL Oral TID WC & HS  . pantoprazole  40 mg Oral BID  . polyvinyl alcohol  1 drop Both Eyes TID WC    Continuous Infusions: . sodium chloride    . piperacillin-tazobactam (ZOSYN)  IV Stopped (02/07/17 0854)    PRN Meds: acetaminophen, albuterol, ipratropium-albuterol, ondansetron **OR** ondansetron (ZOFRAN) IV, RESOURCE THICKENUP CLEAR  Physical Exam         Weak frail elderly person Lethargic, responds to questions with 1-2 word answers Shallow regular breath sounds. Crackles in bases S1 S2 Abdomen soft Has thin extremities  Vital Signs: BP (!) 144/76 (BP Location: Right Arm)   Pulse 87   Temp 97.8 F (36.6 C) (Axillary)   Resp 20   Ht 5\' 10"  (1.778 m)   Wt 72.6 kg (160 lb)   SpO2 100%   BMI 22.96 kg/m  SpO2: SpO2: 100 % O2 Device: O2 Device: Nasal Cannula O2 Flow Rate: O2 Flow Rate (L/min): 3 L/min  Intake/output summary:   Intake/Output Summary (Last 24 hours) at 02/07/17  1241 Last data filed at 02/07/17 0300  Gross per 24 hour  Intake             1445 ml  Output                0 ml  Net             1445 ml   LBM: Last  BM Date: 02/06/17 Baseline Weight: Weight: 72.6 kg (160 lb) Most recent weight: Weight: 72.6 kg (160 lb)       Palliative Assessment/Data:    Flowsheet Rows     Most Recent Value  Intake Tab  Referral Department  Hospitalist  Unit at Time of Referral  Med/Surg Unit  Palliative Care Primary Diagnosis  Other (Comment) [dementia, dysphagia, asp pna, para pneumonic effusion. ]  Palliative Care Type  New Palliative care  Reason for referral  Clarify Goals of Care, Counsel Regarding Hospice  Date first seen by Palliative Care  01/30/17  Clinical Assessment  Palliative Performance Scale Score  10%  Pain Max last 24 hours  4  Pain Min Last 24 hours  3  Dyspnea Max Last 24 Hours  3  Dyspnea Min Last 24 hours  2  Nausea Max Last 24 Hours  3  Nausea Min Last 24 Hours  2  Anxiety Max Last 24 Hours  3  Anxiety Min Last 24 Hours  2  Psychosocial & Spiritual Assessment  Palliative Care Outcomes  Patient/Family meeting held?  Yes  Who was at the meeting?  [place] daughter, patient was not very responsive.   Palliative Care Outcomes  Clarified goals of care      Patient Active Problem List   Diagnosis Date Noted  . Pressure injury of skin 02/04/2017  . HCAP (healthcare-associated pneumonia)   . Encounter for palliative care   . Goals of care, counseling/discussion   . Aspiration pneumonia ([place]) 01/29/2017  . Pleural effusion 01/29/2017  . Dysphagia 01/29/2017  . Dementia 01/29/2017  . COPD with emphysema ([place]) 01/29/2017  . CAD in native artery 01/29/2017  . AKI (acute kidney injury) ([place]) 01/29/2017  . Leg wound, right 12/05/2015  . Fall 05/17/2011  . Subdural hematoma ([place]) 05/15/2011  . Diabetes mellitus ([place]) 05/15/2011  . Hypertension 05/15/2011  . Syncope 05/15/2011    Palliative Care Assessment & Plan    Patient Profile:    Assessment: Dementia, wheel chair bound at baseline.  Severe dysphagia PNA: asp Worsening AKI Underlying at least stage III CKD  underlying COPD, DM  Recommendations/Plan:  Mr. Gambrell is more interactive each day for the last several days.  Plan to continue course of antibiotics and reassess situation daily as his trajectory remains unclear at this time.  Continue to monitor PO intake, renal function, urine output,      Code Status:    Code Status Orders        Start     Ordered   01/29/17 1948  Do not attempt resuscitation (DNR)  Continuous    Question Answer Comment  In the event of cardiac or respiratory ARREST Do not call a "code blue"   In the event of cardiac or respiratory ARREST Do not perform Intubation, CPR, defibrillation or ACLS   In the event of cardiac or respiratory ARREST Use medication by any route, position, wound care, and other measures to relive pain and suffering. May use oxygen, suction and manual treatment of airway obstruction as needed for comfort.      01/29/17 1947    Code Status History    Date Active Date Inactive Code Status Order ID Comments User Context   05/15/2011 11:37 PM 05/19/2011  8:03 PM Full Code [id number]  Rise Patience, MD Inpatient    Advance Directive Documentation     Most Recent Value  Type of Advance Directive  Healthcare Power of Attorney, Living will  Pre-existing  out of facility DNR order (yellow form or pink MOST form)  -  "MOST" Form in Place?  -       Prognosis:   guarded   Discharge Planning:  Likely SNF with palliative vs residential hospice  Care plan was discussed with  Daughter and bedside RN  Thank you for allowing the Palliative Medicine Team to assist in the care of this patient.  Total time: 15 minutes    Greater than 50%  of this time was spent counseling and coordinating care related to the above assessment and plan.  Micheline Rough, MD Please contact Palliative  Medicine Team phone at [phone] for questions and concerns.

## 2017-02-07 NOTE — Progress Notes (Signed)
PROGRESS NOTE    Leon Sharp  DPO:[id number] DOB: 1921/07/06 DOA: 01/29/2017 PCP: Jefm Petty, MD  Brief Narrative:Leon Sharp is a 83 year old male with multiple medical problems most notable for dementia, debility/wheelchair-bound, COPD, CAD with numerous stents, severe dysphagia, history of congestive heart failure, chronic kidney disease stage III was diagnosed with aspiration pneumonia last Friday and was started on Augmentin then. He's had extensive workup for dysphagia, his most recent modified barium swallow was 2 weeks ago, where it was recommended to place a PEG tube, which was declined by his daughter and patient continued on a pured diet with honey thick liquids. He also had an endoscopy a week ago, he was noted to have a mild esophageal stricture which was dilated then. He was brought to the emergency room today due to increased shortness of breath, Workup in the ED notable for mild leukocytosis, hyponatremia, creatinine of 2.2, normal lactate, chest x-ray with left lower lobe pneumonia and moderate left pleural effusion. Palliative following, slight improvement, plan for DC to SNF with palliative care FU  Assessment & Plan:  Aspiration pneumonia (HCC) with large L effusion -Long-standing history of severe dysphagia  -Admitted, started on IV Zosyn, and Repeat chest x-ray 8/11 with worsening large left likely parapneumonic effusion -Long-standing history of dysphagia, failed modified barium swallow 2 weeks ago, speech therapist following but were unable to evaluate him due to depressed mentation -Appreciate palliative consult -s/p Therapeutic thoracentesis 8-13 for Palliative purposes and 1.5L of blood tinged fluid was drained.  -Continue IV Zosyn  -Chest x ray with slightly increase moderate pleural effusion. Patient in no distress, oxygen sat 100 on 2 L. PRN chest x ray.   Acute encephalopathy; Delirium; suspect related to general condition, infection, electrolytes  abnormalities, delirium.  Continue with IV fluids.  CT head, negative for acute stroke.  See note from Palliative care, no ABG<  Pleural effusion mildly increase in size.  He is more alert, and he is  able to answer questions.   Severe dysphagia -As above, SLP following He was started on comfort feeding 8-14.   Alzheimer's dementia -resume Namenda   AKI on CKD3 with  hypernatremia -Baseline creatinine around 1.4-1.5. Holding Celebrex, -Continue with  IV fluids.  -cr stable today at 2.0 Change IV fluids to half NS>     DM -hold lantus.  -SSI   COPD with emphysema (HCC) -Stable, nebs when necessary    CAD in native artery -Stable, continue metoprolol, Plavix and statin  DVT prophylaxis: Heparin subcutaneous  Code Status: DO NOT RESUSCITATE  Family Communication:  No family at bedside today.    Consultants:   Palliative medicine  Antimicrobials:   IV Zosyn since 8/9  Procedures -Thoracentesis 8/13, 1.5 L of blood-tinged fluid drained   Subjective: He is more alert today, he is able to answer questions. Denies dyspnea.    Objective: Vitals:   02/07/17 0409 02/07/17 0835 02/07/17 0839 02/07/17 1147  BP: (!) 144/76     Pulse: 87     Resp: 20     Temp: 97.8 F (36.6 C)     TempSrc: Axillary     SpO2: 100% 100% 100% 100%  Weight:      Height:        Intake/Output Summary (Last 24 hours) at 02/07/17 1340 Last data filed at 02/07/17 0300  Gross per 24 hour  Intake             1325 ml  Output  0 ml  Net             1325 ml   Filed Weights   01/29/17 1304  Weight: 72.6 kg (160 lb)    Examination:  .Gen: cachetic, NAD HEENT: no JVD Lungs: no work of breathing, bilateral air movement, few crackles.  CVS: S 1, S 2 RRR Abd: Soft, nt, nd Extremities: No cyanosis.  Skin: no new rashes  Data Reviewed:   CBC:  Recent Labs Lab 02/02/17 0435 02/03/17 0410 02/04/17 0413 02/05/17 0414 02/06/17 0948  WBC 7.5 7.8 8.5 7.5 7.5    HGB 9.7* 8.9* 9.0* 8.8* 9.4*  HCT 31.7* 28.8* 28.0* 28.3* 30.3*  MCV 98.8 99.0 100.0 100.7* 98.7  PLT 163 167 148* 156 382   Basic Metabolic Panel:  Recent Labs Lab 02/03/17 0410 02/04/17 0413 02/05/17 0414 02/06/17 0948 02/07/17 0338  NA 147* 148* 149* 147* 144  K 3.9 3.8 3.4* 3.9 3.2*  CL 111 110 110 107 106  CO2 31 32 33* 30 30  GLUCOSE 171* 200* 147* 180* 250*  BUN 29* 30* 28* 26* 26*  CREATININE 1.99* 1.99* 1.96* 2.11* 2.03*  CALCIUM 7.9* 8.0* 8.1* 8.3* 7.9*   GFR: Estimated Creatinine Clearance: 23.8 mL/min (A) (by C-G formula based on SCr of 2.03 mg/dL (H)). Liver Function Tests: No results for input(s): AST, ALT, ALKPHOS, BILITOT, PROT, ALBUMIN in the last 168 hours. No results for input(s): LIPASE, AMYLASE in the last 168 hours. No results for input(s): AMMONIA in the last 168 hours. Coagulation Profile: No results for input(s): INR, PROTIME in the last 168 hours. Cardiac Enzymes: No results for input(s): CKTOTAL, CKMB, CKMBINDEX, TROPONINI in the last 168 hours. BNP (last 3 results) No results for input(s): PROBNP in the last 8760 hours. HbA1C: No results for input(s): HGBA1C in the last 72 hours. CBG:  Recent Labs Lab 02/06/17 1156 02/06/17 1657 02/06/17 2253 02/07/17 0737 02/07/17 1234  GLUCAP 198* 216* 220* 223* 220*   Lipid Profile: No results for input(s): CHOL, HDL, LDLCALC, TRIG, CHOLHDL, LDLDIRECT in the last 72 hours. Thyroid Function Tests:  Recent Labs  02/07/17 0338  TSH 1.427   Anemia Panel:  Recent Labs  02/07/17 0338  VITAMINB12 753   Urine analysis:    Component Value Date/Time   COLORURINE YELLOW 01/31/2017 0035   APPEARANCEUR CLOUDY (A) 01/31/2017 0035   LABSPEC 1.014 01/31/2017 0035   PHURINE 5.0 01/31/2017 0035   GLUCOSEU NEGATIVE 01/31/2017 0035   HGBUR LARGE (A) 01/31/2017 0035   BILIRUBINUR NEGATIVE 01/31/2017 0035   KETONESUR NEGATIVE 01/31/2017 0035   PROTEINUR 30 (A) 01/31/2017 0035   UROBILINOGEN 1.0  10/25/2013 2305   NITRITE NEGATIVE 01/31/2017 0035   LEUKOCYTESUR SMALL (A) 01/31/2017 0035   Sepsis Labs: @LABRCNTIP (procalcitonin:4,lacticidven:4)  ) Recent Results (from the past 240 hour(s))  Culture, blood (routine x 2)     Status: None   Collection Time: 01/29/17  4:24 PM  Result Value Ref Range Status   Specimen Description BLOOD BLOOD LEFT FOREARM  Final   Special Requests   Final    BOTTLES DRAWN AEROBIC AND ANAEROBIC Blood Culture adequate volume   Culture   Final    NO GROWTH 5 DAYS Performed at [facility], [place] [address] [place], [place] [address]    Report Status 02/03/2017 FINAL  Final  Culture, blood (routine x 2)     Status: None   Collection Time: 01/29/17  4:44 PM  Result Value Ref Range Status  Specimen Description BLOOD RIGHT ARM  Final   Special Requests IN PEDIATRIC BOTTLE Blood Culture adequate volume  Final   Culture   Final    NO GROWTH 5 DAYS Performed at [facility], [address], [place], [place] [address]    Report Status 02/03/2017 FINAL  Final  MRSA PCR Screening     Status: Abnormal   Collection Time: 01/29/17  7:58 PM  Result Value Ref Range Status   MRSA by PCR POSITIVE (A) NEGATIVE Final    Comment:        The GeneXpert MRSA Assay (FDA approved for NASAL specimens only), is one component of a comprehensive MRSA colonization surveillance program. It is not intended to diagnose MRSA infection nor to guide or monitor treatment for MRSA infections. RESULT CALLED TO, READ BACK BY AND VERIFIED WITH: BELMA CERIC,RN [id number] @ 5320 BY J SCOTTON   Body fluid culture     Status: None   Collection Time: 02/02/17  4:08 PM  Result Value Ref Range Status   Specimen Description PLEURAL LEFT  Final   Special Requests NONE  Final   Gram Stain   Final    FEW WBC PRESENT,BOTH PMN AND MONONUCLEAR NO ORGANISMS SEEN    Culture   Final    NO GROWTH 3 DAYS Performed at [facility], [address] [place], [place]  [address]    Report Status 02/06/2017 FINAL  Final         Radiology Studies: No results found.      Scheduled Meds: . donepezil  5 mg Oral QHS  . DULoxetine  20 mg Oral Q breakfast  . fludrocortisone  0.1 mg Oral Q breakfast  . insulin aspart  0-9 Units Subcutaneous TID WC  . memantine  28 mg Oral Q breakfast  . nystatin  5 mL Oral TID WC & HS  . pantoprazole  40 mg Oral BID  . polyvinyl alcohol  1 drop Both Eyes TID WC   Continuous Infusions: . sodium chloride    . piperacillin-tazobactam (ZOSYN)  IV Stopped (02/07/17 0854)     LOS: 9 days    Time spent: 62  min    Niel Hummer, MD [facility] Pager [phone]  If 7PM-7AM, please contact night-coverage [email or web address] Password [email or web address] 02/07/2017, 1:40 PM

## 2017-02-08 LAB — GLUCOSE, CAPILLARY
GLUCOSE-CAPILLARY: 254 mg/dL — AB (ref 65–99)
GLUCOSE-CAPILLARY: 183 mg/dL — AB (ref 65–99)
Glucose-Capillary: 229 mg/dL — ABNORMAL HIGH (ref 65–99)
Glucose-Capillary: 182 mg/dL — ABNORMAL HIGH (ref 65–99)

## 2017-02-08 LAB — BASIC METABOLIC PANEL
Anion gap: 8 (ref 5–15)
BUN: 28 mg/dL — AB (ref 6–20)
CALCIUM: 7.8 mg/dL — AB (ref 8.9–10.3)
CO2: 29 mmol/L (ref 22–32)
CREATININE: 2.12 mg/dL — AB (ref 0.61–1.24)
Chloride: 104 mmol/L (ref 101–111)
GFR calc Af Amer: 29 mL/min — ABNORMAL LOW (ref >60)
GFR calc non Af Amer: 25 mL/min — ABNORMAL LOW (ref >60)
GLUCOSE: 265 mg/dL — AB (ref 65–99)
Potassium: 3.9 mmol/L (ref 3.5–5.1)
Sodium: 141 mmol/L (ref 135–145)

## 2017-02-08 MED ORDER — BOOST / RESOURCE BREEZE PO LIQD
1.0000 | Freq: Three times a day (TID) | ORAL | Status: DC
  Administered 2017-02-08 – 2017-02-09 (×4): 1 via ORAL

## 2017-02-08 NOTE — Social Work (Addendum)
CSW met with son at bedside. CSW discussed bed offers.  Son would like to wait until they know more on medical status before deciding on SNF.  Family if they want patient to return to [facility] CSW will continue to follow for disposition to SNF.   CSW received a call from [place], Therapist, sports at [facility] following up for status update.  Elissa Hefty, LCSW Clinical Social Worker [phone]

## 2017-02-08 NOTE — Progress Notes (Signed)
PROGRESS NOTE    Leon Sharp  OHY:[id number] DOB: 1921-10-20 DOA: 01/29/2017 PCP: Jefm Petty, MD  Brief Narrative:Leon Sharp is a 83 year old male with multiple medical problems most notable for dementia, debility/wheelchair-bound, COPD, CAD with numerous stents, severe dysphagia, history of congestive heart failure, chronic kidney disease stage III was diagnosed with aspiration pneumonia last Friday and was started on Augmentin then. He's had extensive workup for dysphagia, his most recent modified barium swallow was 2 weeks ago, where it was recommended to place a PEG tube, which was declined by his daughter and patient continued on a pured diet with honey thick liquids. He also had an endoscopy a week ago, he was noted to have a mild esophageal stricture which was dilated then. He was brought to the emergency room today due to increased shortness of breath, Workup in the ED notable for mild leukocytosis, hyponatremia, creatinine of 2.2, normal lactate, chest x-ray with left lower lobe pneumonia and moderate left pleural effusion. Palliative following, slight improvement, plan for DC to SNF with palliative care FU  Assessment & Plan:  Aspiration pneumonia (HCC) with large L effusion -Long-standing history of severe dysphagia  -Admitted, started on IV Zosyn, and Repeat chest x-ray 8/11 with worsening large left likely parapneumonic effusion -Long-standing history of dysphagia, failed modified barium swallow 2 weeks ago, speech therapist following but were unable to evaluate him due to depressed mentation -Appreciate palliative consult -s/p Therapeutic thoracentesis 8-13 for Palliative purposes and 1.5L of blood tinged fluid was drained.  -Continue IV Zosyn  -Chest x ray with slightly increase moderate pleural effusion. Patient in no distress, oxygen sat 100 on 2 L. PRN chest x ray.    Acute encephalopathy; Delirium; suspect related to general condition, infection,  electrolytes abnormalities, delirium.  Continue with IV fluids.  CT head, negative for acute stroke.  See note from Palliative care, no ABG<  Pleural effusion mildly increase in size.  He is more alert, and he is  able to answer questions.  Improving.   Severe dysphagia -As above, SLP following He was started on comfort feeding 8-14.   Alzheimer's dementia -resume Namenda   AKI on CKD3 with  hypernatremia -Baseline creatinine around 1.4-1.5. Holding Celebrex, -Continue with  IV fluids.  -cr stable today at 2.0 Change IV fluids to half NS>     DM -hold lantus.  -SSI   COPD with emphysema (HCC) -Stable, nebs when necessary    CAD in native artery -Stable, continue metoprolol, Plavix and statin  DVT prophylaxis: Heparin subcutaneous  Code Status: DO NOT RESUSCITATE  Family Communication:  No family at bedside today.    Consultants:   Palliative medicine  Antimicrobials:   IV Zosyn since 8/9  Procedures -Thoracentesis 8/13, 1.5 L of blood-tinged fluid drained   Subjective: He is alert, he is feeling better.  Eating few bites.    Objective: Vitals:   02/08/17 0131 02/08/17 0504 02/08/17 0943 02/08/17 0946  BP:  114/63    Pulse:  88    Resp:  16    Temp:  98.1 F (36.7 C)    TempSrc:  Axillary    SpO2: 100% 99% 100% 100%  Weight:      Height:        Intake/Output Summary (Last 24 hours) at 02/08/17 1312 Last data filed at 02/08/17 0937  Gross per 24 hour  Intake          1826.25 ml  Output  0 ml  Net          1826.25 ml   Filed Weights   01/29/17 1304  Weight: 72.6 kg (160 lb)    Examination:  .Gen: Cachetic, no acute distress.  HEENT: Supple, No JVD Lungs: Bilateral; air movement, no wheezing.  CVS: S 1, S 2 RRR Abd: Soft, nt, nd Extremities: no cyanosis.  Skin: no new rashes  Data Reviewed:   CBC:  Recent Labs Lab 02/02/17 0435 02/03/17 0410 02/04/17 0413 02/05/17 0414 02/06/17 0948  WBC 7.5 7.8 8.5 7.5  7.5  HGB 9.7* 8.9* 9.0* 8.8* 9.4*  HCT 31.7* 28.8* 28.0* 28.3* 30.3*  MCV 98.8 99.0 100.0 100.7* 98.7  PLT 163 167 148* 156 482   Basic Metabolic Panel:  Recent Labs Lab 02/04/17 0413 02/05/17 0414 02/06/17 0948 02/07/17 0338 02/08/17 0417  NA 148* 149* 147* 144 141  K 3.8 3.4* 3.9 3.2* 3.9  CL 110 110 107 106 104  CO2 32 33* 30 30 29   GLUCOSE 200* 147* 180* 250* 265*  BUN 30* 28* 26* 26* 28*  CREATININE 1.99* 1.96* 2.11* 2.03* 2.12*  CALCIUM 8.0* 8.1* 8.3* 7.9* 7.8*   GFR: Estimated Creatinine Clearance: 22.8 mL/min (A) (by C-G formula based on SCr of 2.12 mg/dL (H)). Liver Function Tests: No results for input(s): AST, ALT, ALKPHOS, BILITOT, PROT, ALBUMIN in the last 168 hours. No results for input(s): LIPASE, AMYLASE in the last 168 hours. No results for input(s): AMMONIA in the last 168 hours. Coagulation Profile: No results for input(s): INR, PROTIME in the last 168 hours. Cardiac Enzymes: No results for input(s): CKTOTAL, CKMB, CKMBINDEX, TROPONINI in the last 168 hours. BNP (last 3 results) No results for input(s): PROBNP in the last 8760 hours. HbA1C: No results for input(s): HGBA1C in the last 72 hours. CBG:  Recent Labs Lab 02/07/17 1234 02/07/17 1622 02/07/17 2312 02/08/17 0803 02/08/17 1206  GLUCAP 220* 207* 227* 254* 183*   Lipid Profile: No results for input(s): CHOL, HDL, LDLCALC, TRIG, CHOLHDL, LDLDIRECT in the last 72 hours. Thyroid Function Tests:  Recent Labs  02/07/17 0338  TSH 1.427   Anemia Panel:  Recent Labs  02/07/17 0338  VITAMINB12 753   Urine analysis:    Component Value Date/Time   COLORURINE YELLOW 01/31/2017 0035   APPEARANCEUR CLOUDY (A) 01/31/2017 0035   LABSPEC 1.014 01/31/2017 0035   PHURINE 5.0 01/31/2017 0035   GLUCOSEU NEGATIVE 01/31/2017 0035   HGBUR LARGE (A) 01/31/2017 0035   BILIRUBINUR NEGATIVE 01/31/2017 0035   KETONESUR NEGATIVE 01/31/2017 0035   PROTEINUR 30 (A) 01/31/2017 0035   UROBILINOGEN 1.0  10/25/2013 2305   NITRITE NEGATIVE 01/31/2017 0035   LEUKOCYTESUR SMALL (A) 01/31/2017 0035   Sepsis Labs: @LABRCNTIP (procalcitonin:4,lacticidven:4)  ) Recent Results (from the past 240 hour(s))  Culture, blood (routine x 2)     Status: None   Collection Time: 01/29/17  4:24 PM  Result Value Ref Range Status   Specimen Description BLOOD BLOOD LEFT FOREARM  Final   Special Requests   Final    BOTTLES DRAWN AEROBIC AND ANAEROBIC Blood Culture adequate volume   Culture   Final    NO GROWTH 5 DAYS Performed at [facility], [place] [address], [place], [place] [address]    Report Status 02/03/2017 FINAL  Final  Culture, blood (routine x 2)     Status: None   Collection Time: 01/29/17  4:44 PM  Result Value Ref Range Status   Specimen Description BLOOD RIGHT  ARM  Final   Special Requests IN PEDIATRIC BOTTLE Blood Culture adequate volume  Final   Culture   Final    NO GROWTH 5 DAYS Performed at [facility], [place] [address] [place], [place] [address]    Report Status 02/03/2017 FINAL  Final  MRSA PCR Screening     Status: Abnormal   Collection Time: 01/29/17  7:58 PM  Result Value Ref Range Status   MRSA by PCR POSITIVE (A) NEGATIVE Final    Comment:        The GeneXpert MRSA Assay (FDA approved for NASAL specimens only), is one component of a comprehensive MRSA colonization surveillance program. It is not intended to diagnose MRSA infection nor to guide or monitor treatment for MRSA infections. RESULT CALLED TO, READ BACK BY AND VERIFIED WITH: BELMA CERIC,RN [id number] @ 2956 BY J SCOTTON   Body fluid culture     Status: None   Collection Time: 02/02/17  4:08 PM  Result Value Ref Range Status   Specimen Description PLEURAL LEFT  Final   Special Requests NONE  Final   Gram Stain   Final    FEW WBC PRESENT,BOTH PMN AND MONONUCLEAR NO ORGANISMS SEEN    Culture   Final    NO GROWTH 3 DAYS Performed at [facility], [address], [place], [place]  [address]    Report Status 02/06/2017 FINAL  Final         Radiology Studies: No results found.      Scheduled Meds: . donepezil  5 mg Oral QHS  . DULoxetine  20 mg Oral Q breakfast  . feeding supplement  1 Container Oral TID WC  . fludrocortisone  0.1 mg Oral Q breakfast  . insulin aspart  0-9 Units Subcutaneous TID WC  . ipratropium-albuterol  3 mL Nebulization Q6H  . memantine  28 mg Oral Q breakfast  . nystatin  5 mL Oral TID WC & HS  . pantoprazole  40 mg Oral BID  . polyvinyl alcohol  1 drop Both Eyes TID WC   Continuous Infusions: . sodium chloride 75 mL/hr at 02/08/17 0555  . piperacillin-tazobactam (ZOSYN)  IV Stopped (02/08/17 1000)     LOS: 10 days    Time spent: 42  min    Niel Hummer, MD [facility] Pager [phone]  If 7PM-7AM, please contact night-coverage [email or web address] Password [email or web address] 02/08/2017, 1:12 PM

## 2017-02-09 LAB — BASIC METABOLIC PANEL
ANION GAP: 11 (ref 5–15)
BUN: 30 mg/dL — ABNORMAL HIGH (ref 6–20)
CALCIUM: 7.9 mg/dL — AB (ref 8.9–10.3)
CO2: 26 mmol/L (ref 22–32)
Chloride: 104 mmol/L (ref 101–111)
Creatinine, Ser: 2.22 mg/dL — ABNORMAL HIGH (ref 0.61–1.24)
GFR, EST AFRICAN AMERICAN: 28 mL/min — AB (ref >60)
GFR, EST NON AFRICAN AMERICAN: 24 mL/min — AB (ref >60)
Glucose, Bld: 259 mg/dL — ABNORMAL HIGH (ref 65–99)
POTASSIUM: 3.3 mmol/L — AB (ref 3.5–5.1)
Sodium: 141 mmol/L (ref 135–145)

## 2017-02-09 LAB — GLUCOSE, CAPILLARY
GLUCOSE-CAPILLARY: 232 mg/dL — AB (ref 65–99)
Glucose-Capillary: 244 mg/dL — ABNORMAL HIGH (ref 65–99)

## 2017-02-09 MED ORDER — POLYVINYL ALCOHOL 1.4 % OP SOLN: 1.0000 [drp] | Freq: Three times a day (TID) | OPHTHALMIC | 0 refills | Status: AC

## 2017-02-09 MED ORDER — MORPHINE SULFATE 10 MG/5ML PO SOLN: 2.5000 mg | ORAL | Status: DC | PRN

## 2017-02-09 MED ORDER — MORPHINE SULFATE 10 MG/5ML PO SOLN: 2.5000 mg | ORAL | 0 refills | Status: AC | PRN

## 2017-02-09 MED ORDER — IPRATROPIUM-ALBUTEROL 0.5-2.5 (3) MG/3ML IN SOLN: 3.0000 mL | Freq: Four times a day (QID) | RESPIRATORY_TRACT | 0 refills | Status: AC

## 2017-02-09 NOTE — Progress Notes (Signed)
PT Cancellation Note  Patient Details Name: Leon Sharp MRN: [id number] DOB: 06-08-1922   Cancelled Treatment:     attempted to see twice today. AM Palliative Meeting PM Pt care([place])  Pt has been evaluated with plans for [place]  PTA [facility]  Acute  Rehab Pager      [phone]

## 2017-02-09 NOTE — Progress Notes (Signed)
Daily Progress Note   Patient Name: Leon Sharp       Date: 02/09/2017 DOB: 1922-03-29  Age: 83 y.o. MRN#: [id number] Attending Physician: Elmarie Shiley, MD Primary Care Physician: Jefm Petty, MD Admit Date: 01/29/2017  Reason for Consultation/Follow-up: Establishing goals of care  Subjective:. I met today with patient's son and daughter.  He is awake but weak and very tired today.  Staff reports that he continues to cough with intake.   See below:   Length of Stay: 11  Current Medications: Scheduled Meds:  . donepezil  5 mg Oral QHS  . DULoxetine  20 mg Oral Q breakfast  . feeding supplement  1 Container Oral TID WC  . fludrocortisone  0.1 mg Oral Q breakfast  . insulin aspart  0-9 Units Subcutaneous TID WC  . ipratropium-albuterol  3 mL Nebulization Q6H  . memantine  28 mg Oral Q breakfast  . nystatin  5 mL Oral TID WC & HS  . pantoprazole  40 mg Oral BID  . polyvinyl alcohol  1 drop Both Eyes TID WC    Continuous Infusions: . sodium chloride 75 mL/hr at 02/09/17 0526  . piperacillin-tazobactam (ZOSYN)  IV Stopped (02/09/17 0927)    PRN Meds: acetaminophen, albuterol, ondansetron **OR** ondansetron (ZOFRAN) IV, RESOURCE THICKENUP CLEAR  Physical Exam         Weak frail elderly person Lethargic, responds to questions with 1-2 word answers Shallow regular breath sounds. Crackles in bases S1 S2 Abdomen soft Has thin extremities  Vital Signs: BP 113/86   Pulse 91   Temp 98 F (36.7 C) (Oral)   Resp 20   Ht 5' 10"  (1.778 m)   Wt 72.6 kg (160 lb)   SpO2 100%   BMI 22.96 kg/m  SpO2: SpO2: 100 % O2 Device: O2 Device: Nasal Cannula O2 Flow Rate: O2 Flow Rate (L/min): 2 L/min  Intake/output summary:   Intake/Output Summary (Last 24 hours) at 02/09/17  1105 Last data filed at 02/09/17 0845  Gross per 24 hour  Intake           2422.5 ml  Output                0 ml  Net           2422.5 ml  LBM: Last BM Date: 02/08/17 Baseline Weight: Weight: 72.6 kg (160 lb) Most recent weight: Weight: 72.6 kg (160 lb)       Palliative Assessment/Data:    Flowsheet Rows     Most Recent Value  Intake Tab  Referral Department  Hospitalist  Unit at Time of Referral  Med/Surg Unit  Palliative Care Primary Diagnosis  Other (Comment) [dementia, dysphagia, asp pna, para pneumonic effusion. ]  Palliative Care Type  New Palliative care  Reason for referral  Clarify Goals of Care, Counsel Regarding Hospice  Date first seen by Palliative Care  01/30/17  Clinical Assessment  Palliative Performance Scale Score  10%  Pain Max last 24 hours  4  Pain Min Last 24 hours  3  Dyspnea Max Last 24 Hours  3  Dyspnea Min Last 24 hours  2  Nausea Max Last 24 Hours  3  Nausea Min Last 24 Hours  2  Anxiety Max Last 24 Hours  3  Anxiety Min Last 24 Hours  2  Psychosocial & Spiritual Assessment  Palliative Care Outcomes  Patient/Family meeting held?  Yes  Who was at the meeting?  [place] daughter, patient was not very responsive.   Palliative Care Outcomes  Clarified goals of care      Patient Active Problem List   Diagnosis Date Noted  . Pressure injury of skin 02/04/2017  . HCAP (healthcare-associated pneumonia)   . Encounter for palliative care   . Goals of care, counseling/discussion   . Aspiration pneumonia ([place]) 01/29/2017  . Pleural effusion 01/29/2017  . Dysphagia 01/29/2017  . Dementia 01/29/2017  . 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 CAD in native artery 01/29/2017  . AKI (acute kidney injury) ([place]) 01/29/2017  . Leg wound, right 12/05/2015  . Fall 05/17/2011  . Subdural hematoma ([place]) 05/15/2011  . Diabetes mellitus ([place]) 05/15/2011  . Hypertension 05/15/2011  . Syncope 05/15/2011    Palliative Care Assessment & Plan    Patient Profile:    Assessment: Dementia, wheel chair bound at baseline.  Severe dysphagia PNA: asp Worsening AKI Underlying at least stage III CKD  underlying COPD, DM  Recommendations/Plan:  Leon Sharp is able to open eyes and converse today, but family reports realizing that his body is shutting down.  He is still not taking in significant hydration, and Cr has been creeping up despite IVF (1.96 on 8/16 to 2.22 today).  Discussed options of trying to discharge to SNF (Although I am concerned that he is going to continue to decompensate regardless of interventions moving forward) vs residential hospice with a focus on comfort.  They understand that residential hospice will not continue IVF nor treat aspiration with further abx.  Family would like to pursue placement at residential hospice for EOL care.    Code Status:    Code Status Orders        Start     Ordered   01/29/17 1948  Do not attempt resuscitation (DNR)  Continuous    Question Answer Comment  In the event of cardiac or respiratory ARREST Do not call a "code blue"   In the event of cardiac or respiratory ARREST Do not perform Intubation, CPR, defibrillation or ACLS   In the event of cardiac or respiratory ARREST Use medication by any route, position, wound care, and other measures to relive pain and suffering. May use oxygen, suction and manual treatment of airway obstruction as needed for comfort.      01/29/17 1947  Code Status History    Date Active Date Inactive Code Status Order ID Comments User Context   05/15/2011 11:37 PM 05/19/2011  8:03 PM Full Code [id number]  Rise Patience, MD Inpatient    Advance Directive Documentation     Most Recent Value  Type of Advance Directive  Healthcare Power of Attorney, Living will  Pre-existing out of facility DNR order (yellow form or pink MOST form)  -  "MOST" Form in Place?  -       Prognosis:   <2 weeks most likely.  Leon Sharp will continue to  aspirate moving forward and is not taking in sufficient nutrition and hydration to sustain himself.  His Cr has been climbing over the past 2 days despite IVF, and he will continue to become dehydrated very quickly once IVF are discontinued.  He has been having intermittent SOB, and he has a know pleural effusion (s/p thoracentesis for 1.5L on 8/13)  that I am concerned may be re-accumulating.  I am concerned that symptoms will continue to escalate over the next several days as he continues to decompensate.  I believe that he will be best served by residential hospice moving forward if this can be arranged.    Discharge Planning:  Residential hospice- Family would like to pursue placement at [facility] in [facility].  Expressed preference for [facility] if [facility] unable to accept him.  Care plan was discussed with  Daughter, son, son in law and Dr. Tyrell Antonio  Thank you for allowing the Palliative Medicine Team to assist in the care of this patient.  Total time: 50 minutes    Greater than 50%  of this time was spent counseling and coordinating care related to the above assessment and plan.  Micheline Rough, MD Please contact Palliative Medicine Team phone at [phone] for questions and concerns.

## 2017-02-09 NOTE — Progress Notes (Signed)
Pt will DC today to transfer to [facility] for residential hospice care. Family at bedside aware and agreeable to plan. Pt will transport via [place] completed medical necessity form and arranged for transportation. RN has called report to Hospice.  Sharren Bridge, MSW, LCSW Clinical Social Work 02/09/2017 [phone]

## 2017-02-09 NOTE — Consult Note (Signed)
[facility]--Met with dtr-Cathy and son Ed in conference room to discuss [facility] [place].  Attempted to visit with pt at bedside and he would awaken and reach for this RN's hand but spoke in unintelligible words.  Reviewed Hospice Home philosophy of comfort care, team approach to care 2 levels-of-care and financial subsidy available.  Family are interested in transfer of pt to [facility].  Dr Beverlyn Roux has approved pt for admission and bed is available today.  Updated SW Jinny Blossom and [employer].  Jinny Blossom has contacted discharging MD and d/c has been completed and Jinny Blossom will call [facility] to set up transport.  Thank you for the opportunity to serve pt and family during this difficult time.  Wynetta Fines, RN

## 2017-02-09 NOTE — Progress Notes (Signed)
CSW has been following for disposition planning. Pt from [facility] and over the course of hospitalization plans have changed - family was hoping pt would be able to benefit from SNF with palliative care but now, due to pt's decline, interested in pursuing full comfort care.  Met with daughter and son and bedside. Both agree to referral to residential hospice- prefer [facility] [place] [facility] due to familiarity with that facility. and would choose [facility] as next choice. CSW made referral.  Will follow.  Sharren Bridge, MSW, LCSW Clinical Social Work 02/09/2017 [phone]

## 2017-02-09 NOTE — Progress Notes (Signed)
Patient removed his PIV. RN tried to restart PIV but was unsuccessful. IV team was notified.

## 2017-02-09 NOTE — Progress Notes (Signed)
Inpatient Diabetes Program Recommendations  AACE/ADA: New Consensus Statement on Inpatient Glycemic Control (2015)  Target Ranges:  Prepandial:   less than 140 mg/dL      Peak postprandial:   less than 180 mg/dL (1-2 hours)      Critically ill patients:  140 - 180 mg/dL   Lab Results  Component Value Date   GLUCAP 244 (H) 02/09/2017    Review of Glycemic ControlResults for LEOPOLDO, MAZZIE (MRN [id number]) as of 02/09/2017 11:44  Ref. Range 02/08/2017 08:03 02/08/2017 12:06 02/08/2017 16:41 02/08/2017 22:13 02/09/2017 08:20  Glucose-Capillary Latest Ref Range: 65 - 99 mg/dL 254 (H) 183 (H) 229 (H) 182 (H) 244 (H)   Diabetes history: Type 2 diabetes Outpatient Diabetes medications: Lantus 28 units q HS, Humalog 8-15 units tid with meals Current orders for Inpatient glycemic control:  Novolog sensitive tid with meals Inpatient Diabetes Program Recommendations:   If appropriate, may consider adding Lantus 5 units daily.    Thanks, Adah Perl, RN, BC-ADM Inpatient Diabetes Coordinator Pager [phone] (8a-5p)

## 2017-02-09 NOTE — Discharge Summary (Signed)
Physician Discharge Summary  ERNESTINE LANGWORTHY JQB:[id number] DOB: 07-28-1921 DOA: 01/29/2017  PCP: Jefm Petty, MD  Admit date: 01/29/2017 Discharge date: 02/09/2017  Admitted From: ALF Disposition:  Residential Hospice  Recommendations for Outpatient Follow-up:  Discharge to residential hospice for comfort care.   Discharge Condition: Guarded.  CODE STATUS: DNR Diet recommendation; comfort feeding.   Brief/Interim Summary: Tori Milks a28 year old male with multiple medical problems most notable for dementia, debility/wheelchair-bound, COPD, CAD with numerous stents, severe dysphagia, history of congestive heart failure, chronic kidney disease stage III was diagnosed with aspiration pneumonia last Friday and was started on Augmentin then. He's had extensive workup for dysphagia, his most recent modified barium swallow was 2 weeks ago, where it was recommended to place a PEG tube, which was declined by his daughter and patient continued on a pured diet with honey thick liquids. He also had an endoscopy a week ago, he was noted to have a mild esophageal stricture which was dilated then. He was brought to the emergency room today due to increased shortness of breath, Workup in the ED notable for mild leukocytosis, hyponatremia, creatinine of 2.2, normal lactate, chest x-ray with left lower lobe pneumonia and moderate left pleural effusion. Palliative following, slight improvement, plan for DC to SNF with palliative care FU  Admitted with Aspiration PNA, pleural effusion. Patient was treated with IV antibiotics. He underwent paracentesis. His mental status fluctuated during hospitalization. He continue to have poor oral intake and sign of aspiration. Family met with palliative care, plan is for residential hospice placement.     Assessment & Plan:  Aspiration pneumonia (HCC) with large L effusion -Long-standing history of severe dysphagia  -Admitted, started on IV Zosyn, and  Repeat chest x-ray 8/11 with worsening large left likely parapneumonic effusion -Long-standing history of dysphagia, failed modified barium swallow 2 weeks ago, speech therapist following but were unable to evaluate him due to depressed mentation -Appreciate palliative consult -s/p Therapeutic thoracentesis 8-13 for Palliative purposes and 1.5L of blood tinged fluid was drained.  -Chest x ray with slightly increase moderate pleural effusion. Patient in no distress, oxygen sat 100 on 2 L. PRN chest x ray.  Received 10 days of IV zosyn.   Acute encephalopathy; Delirium; suspect related to general condition, infection, electrolytes abnormalities, delirium.  Continue with IV fluids. Will stop fluids at discharge.  CT head, negative for acute stroke.  See note from Palliative care, no ABG<  Pleural effusion mildly increase in size.  He is not eating a lot, every time he try to eat he cough.   Severe dysphagia -As above, SLP following He was started on comfort feeding 8-14.  No significant oral intake, cough spell while eating.   Alzheimer's dementia -on Namenda   AKIon CKD3 with  hypernatremia -Baseline creatinine around 1.4-1.5. Holding Celebrex, -Continue with  IV fluids.  -cr stable today at 2.0 Change IV fluids to half NS>  stop IV fluids at discharge    DM -hold lantus.  -SSI  COPD with emphysema (HCC) -Stable, nebs when necessary  CAD in native artery -Stable, continue metoprolol, Plavix and statin   Discharge Diagnoses:  Principal Problem:   Aspiration pneumonia ([place]) Active Problems:   Diabetes mellitus ([place])   Hypertension   Pleural effusion   Dysphagia   Dementia   COPD with emphysema ([place])   CAD in native artery   AKI (acute kidney injury) ([place])   HCAP (healthcare-associated pneumonia)   Encounter for palliative care   Goals of care,  counseling/discussion   Pressure injury of skin    Discharge Instructions  Discharge Instructions    Diet  - low sodium heart healthy    Complete by:  As directed      Allergies as of 02/09/2017   No Known Allergies     Medication List    STOP taking these medications   amoxicillin-clavulanate 875-125 MG tablet Commonly known as:  AUGMENTIN   ANORO ELLIPTA 62.5-25 MCG/INH Aepb Generic drug:  umeclidinium-vilanterol   celecoxib 100 MG capsule Commonly known as:  CELEBREX   DULoxetine 20 MG capsule Commonly known as:  CYMBALTA   insulin glargine 100 UNIT/ML injection Commonly known as:  LANTUS   insulin lispro 100 UNIT/ML injection Commonly known as:  HUMALOG   metoprolol succinate 25 MG 24 hr tablet Commonly known as:  TOPROL-XL   nitroGLYCERIN 0.4 MG SL tablet Commonly known as:  NITROSTAT   NU-IRON 150 MG capsule Generic drug:  iron polysaccharides   Omega-3 1000 MG Caps   pravastatin 40 MG tablet Commonly known as:  PRAVACHOL   sitaGLIPtin 50 MG tablet Commonly known as:  JANUVIA   vitamin C 500 MG tablet Commonly known as:  ASCORBIC ACID   zinc gluconate 50 MG tablet     TAKE these medications   acetaminophen 500 MG tablet Commonly known as:  TYLENOL Take 1,000 mg by mouth every 8 (eight) hours as needed (for pain).   aspirin 81 MG tablet Take 81 mg by mouth 2 (two) times daily.   clopidogrel 75 MG tablet Commonly known as:  PLAVIX Take 1 tablet (75 mg total) by mouth daily. What changed:  when to take this   cyanocobalamin 500 MCG tablet Take 500 mcg by mouth daily with breakfast.   donepezil 5 MG tablet Commonly known as:  ARICEPT Take 5 mg by mouth at bedtime.   famotidine 40 MG tablet Commonly known as:  PEPCID Take 40 mg by mouth at bedtime.   fludrocortisone 0.1 MG tablet Commonly known as:  FLORINEF Take 0.1 mg by mouth daily with breakfast.   hydroxypropyl methylcellulose / hypromellose 2.5 % ophthalmic solution Commonly known as:  ISOPTO TEARS / GONIOVISC Place 1 drop into both eyes 3 (three) times daily with meals.    ipratropium-albuterol 0.5-2.5 (3) MG/3ML Soln Commonly known as:  DUONEB Take 3 mLs by nebulization every 6 (six) hours.   morphine 10 MG/5ML solution Take 1.3 mLs (2.6 mg total) by mouth every 4 (four) hours as needed for severe pain.   NAMENDA XR 28 MG Cp24 24 hr capsule Generic drug:  memantine Take 28 mg by mouth daily with breakfast.   nystatin 100000 UNIT/ML suspension Commonly known as:  MYCOSTATIN Take 5 mLs by mouth 4 (four) times daily -  with meals and at bedtime.   ondansetron 4 MG tablet Commonly known as:  ZOFRAN Take 4 mg by mouth every 6 (six) hours as needed for nausea or vomiting.   pantoprazole 40 MG tablet Commonly known as:  PROTONIX Take 40 mg by mouth 2 (two) times daily.   polyvinyl alcohol 1.4 % ophthalmic solution Commonly known as:  LIQUIFILM TEARS Place 1 drop into both eyes 3 (three) times daily with meals.       No Known Allergies  Consultations:  Palliative    Procedures/Studies: Dg Chest 1 View  Result Date: 02/02/2017 CLINICAL DATA:  Status post thoracentesis. EXAM: CHEST 1 VIEW COMPARISON:  Radiographs of January 31, 2017. FINDINGS: Stable cardiomediastinal silhouette. Atherosclerosis of thoracic  aorta is noted. Sternotomy wires are noted. Left pleural effusion is significantly smaller status post thoracentesis. No pneumothorax is noted. Mild right basilar subsegmental atelectasis is noted. Bony thorax is unremarkable. IMPRESSION: Aortic atherosclerosis. Left pleural effusion significantly smaller status post thoracentesis. Mild right basilar subsegmental atelectasis. Electronically Signed   By: Marijo Conception, M.D.   On: 02/02/2017 16:22   Dg Chest 2 View  Result Date: 01/31/2017 CLINICAL DATA:  Shortness of breath, weakness, altered mental status, history of skin cancer, coronary artery disease post MI, Parkinson's dementia, hypertension, diabetes mellitus, COPD EXAM: CHEST  2 VIEW COMPARISON:  01/29/2017 FINDINGS: Normal heart size  post CABG and coronary stenting. Atherosclerotic calcification aorta. Subtotal opacification of LEFT hemithorax by increased LEFT pleural effusion. Subtotal atelectasis of LEFT lung. Mild RIGHT basilar atelectasis. No pulmonary infiltrate or pneumothorax. Diffuse osseous demineralization. IMPRESSION: Increased LEFT pleural effusion with subtotal atelectasis of LEFT lung. Mild RIGHT basilar atelectasis. Post CABG and coronary stenting. Aortic Atherosclerosis (ICD10-I70.0). Electronically Signed   By: Lavonia Dana M.D.   On: 01/31/2017 09:16   Dg Chest 2 View  Result Date: 01/29/2017 CLINICAL DATA:  Shortness of breath for 1 day. EXAM: CHEST  2 VIEW COMPARISON:  Chest x-ray dated June 30, 2016. FINDINGS: Postsurgical changes related to prior CABG. The cardiomediastinal silhouette is normal in size. Atherosclerotic calcification of the aortic arch. Low lung volumes are present, causing crowding of the pulmonary vasculature. Large left pleural effusion with left lower lobe consolidation. The right lung is clear. No pneumothorax. No acute osseous abnormality. Old right acromioclavicular separation. IMPRESSION: Large left pleural effusion with left lower lobe consolidation, which could represent pneumonia or atelectasis. Consider further evaluation with chest CT as clinically indicated. Electronically Signed   By: Titus Dubin M.D.   On: 01/29/2017 14:39   Ct Head Wo Contrast  Result Date: 02/04/2017 CLINICAL DATA:  Altered mental status EXAM: CT HEAD WITHOUT CONTRAST TECHNIQUE: Contiguous axial images were obtained from the base of the skull through the vertex without intravenous contrast. COMPARISON:  Head CT 06/30/2016 FINDINGS: Brain: No mass lesion, intraparenchymal hemorrhage or extra-axial collection. No evidence of acute cortical infarct. There is periventricular hypoattenuation compatible with chronic microvascular disease. Old bilateral cerebellar infarcts. There is global atrophy, unchanged.  Vascular: No hyperdense vessel sign. Atherosclerotic calcification of the vertebral and internal carotid arteries at the skull base. Skull: Normal visualized skull base, calvarium and extracranial soft tissues. Sinuses/Orbits: No sinus fluid levels or advanced mucosal thickening. No mastoid effusion. Normal orbits. IMPRESSION: Old bilateral cerebellar infarcts and chronic microvascular ischemia without acute abnormality. Unchanged global atrophy. Electronically Signed   By: Ulyses Jarred M.D.   On: 02/04/2017 21:54   Dg Chest Port 1 View  Result Date: 02/04/2017 CLINICAL DATA:  Unresponsive. History of COPD, coronary artery disease and CABG, diabetes, former smoker. EXAM: PORTABLE CHEST 1 VIEW COMPARISON:  Portable chest x-ray of February 02, 2017 FINDINGS: The lungs remain hypoinflated. The volume of pleural fluid on the left has increased. The interstitial markings within the left lung remain increased. The left hemidiaphragm remains obscured. The cardiac silhouette is enlarged. A right coronary artery stent is visible. The pulmonary vascularity is not clearly engorged. There is calcification in the wall of the thoracic aorta. The sternal wires are grossly intact. There is no acute bony abnormality of the thorax. There is a stable curvilinear calcification in the right upper quadrant of the abdomen known to be related to the right adrenal gland. IMPRESSION: Increasing pleural effusion on the  left. Persistent left basilar atelectasis or pneumonia. No overt pulmonary edema. Thoracic aortic atherosclerosis. Electronically Signed   By: David  Martinique M.D.   On: 02/04/2017 14:59   US Thoracentesis Asp Pleural Space W/img Guide  Result Date: 02/02/2017 INDICATION: Alzheimer's dementia, chronic kidney disease, dysphagia, COPD, pneumonia, large left pleural effusion. Request made for diagnostic and therapeutic left thoracentesis. EXAM: ULTRASOUND GUIDED DIAGNOSTIC AND THERAPEUTIC LEFT THORACENTESIS MEDICATIONS: None.  COMPLICATIONS: None immediate. PROCEDURE: An ultrasound guided thoracentesis was thoroughly discussed with the patient and questions answered. The benefits, risks, alternatives and complications were also discussed. The patient understands and wishes to proceed with the procedure. Written consent was obtained. Ultrasound was performed to localize and mark an adequate pocket of fluid in the left chest. The area was then prepped and draped in the normal sterile fashion. 1% Lidocaine was used for local anesthesia. Under ultrasound guidance a Safe-T-Centesis catheter was introduced. Thoracentesis was performed. The catheter was removed and a dressing applied. FINDINGS: A total of approximately 1.5 liters of bloody fluid was removed. Samples were sent to the laboratory as requested by the clinical team. Due to this being patient's initial thoracentesis only the above amount of fluid was removed today. IMPRESSION: Successful ultrasound guided diagnostic and therapeutic left thoracentesis yielding 1.5 liters of pleural fluid. Dr. Broadus John was notified of the above findings. Read by: Rowe Robert, PA-C Electronically Signed   By: Sandi Mariscal M.D.   On: 02/02/2017 16:19     Subjective: Lethargic, ope eyes to voice. Denies pain   Discharge Exam: Vitals:   02/09/17 0850 02/09/17 1421  BP:    Pulse:    Resp:    Temp:    SpO2: 100% 98%   Vitals:   02/09/17 0317 02/09/17 0527 02/09/17 0850 02/09/17 1421  BP:  113/86    Pulse:  91    Resp:  20    Temp:  98 F (36.7 C)    TempSrc:  Oral    SpO2: 99% 100% 100% 98%  Weight:      Height:        General: cachetic, lethargic  Cardiovascular: RRR, S1/S2 + Respiratory: diminish breath sounds.  Abdominal: Soft, NT, ND, bowel sounds + Extremities: no edema,  Cyanosis toes.     The results of significant diagnostics from this hospitalization (including imaging, microbiology, ancillary and laboratory) are listed below for reference.      Microbiology: Recent Results (from the past 240 hour(s))  Body fluid culture     Status: None   Collection Time: 02/02/17  4:08 PM  Result Value Ref Range Status   Specimen Description PLEURAL LEFT  Final   Special Requests NONE  Final   Gram Stain   Final    FEW WBC PRESENT,BOTH PMN AND MONONUCLEAR NO ORGANISMS SEEN    Culture   Final    NO GROWTH 3 DAYS Performed at [facility], [address], [place], [place] [address]    Report Status 02/06/2017 FINAL  Final     Labs: BNP (last 3 results)  Recent Labs  01/29/17 1323  BNP 254.2*   Basic Metabolic Panel:  Recent Labs Lab 02/05/17 0414 02/06/17 0948 02/07/17 0338 02/08/17 0417 02/09/17 0407  NA 149* 147* 144 141 141  K 3.4* 3.9 3.2* 3.9 3.3*  CL 110 107 106 104 104  CO2 33* _0 GLUCOSE 147* 180* 250* 265* 259*  BUN 28* 26* 26* 28* 30*  CREATININE 1.96* 2.11* 2.03* 2.12*  2.22*  CALCIUM 8.1* 8.3* 7.9* 7.8* 7.9*   Liver Function Tests: No results for input(s): AST, ALT, ALKPHOS, BILITOT, PROT, ALBUMIN in the last 168 hours. No results for input(s): LIPASE, AMYLASE in the last 168 hours. No results for input(s): AMMONIA in the last 168 hours. CBC:  Recent Labs Lab 02/03/17 0410 02/04/17 0413 02/05/17 0414 02/06/17 0948  WBC 7.8 8.5 7.5 7.5  HGB 8.9* 9.0* 8.8* 9.4*  HCT 28.8* 28.0* 28.3* 30.3*  MCV 99.0 100.0 100.7* 98.7  PLT 167 148* 156 155   Cardiac Enzymes: No results for input(s): CKTOTAL, CKMB, CKMBINDEX, TROPONINI in the last 168 hours. BNP: Invalid input(s): POCBNP CBG:  Recent Labs Lab 02/08/17 1206 02/08/17 1641 02/08/17 2213 02/09/17 0820 02/09/17 1143  GLUCAP 183* 229* 182* 244* 232*   D-Dimer No results for input(s): DDIMER in the last 72 hours. Hgb A1c No results for input(s): HGBA1C in the last 72 hours. Lipid Profile No results for input(s): CHOL, HDL, LDLCALC, TRIG, CHOLHDL, LDLDIRECT in the last 72 hours. Thyroid function studies  Recent Labs   02/07/17 0338  TSH 1.427   Anemia work up  Recent Labs  02/07/17 0338  VITAMINB12 753   Urinalysis    Component Value Date/Time   COLORURINE YELLOW 01/31/2017 0035   APPEARANCEUR CLOUDY (A) 01/31/2017 0035   LABSPEC 1.014 01/31/2017 0035   PHURINE 5.0 01/31/2017 0035   GLUCOSEU NEGATIVE 01/31/2017 0035   HGBUR LARGE (A) 01/31/2017 0035   BILIRUBINUR NEGATIVE 01/31/2017 0035   KETONESUR NEGATIVE 01/31/2017 0035   PROTEINUR 30 (A) 01/31/2017 0035   UROBILINOGEN 1.0 10/25/2013 2305   NITRITE NEGATIVE 01/31/2017 0035   LEUKOCYTESUR SMALL (A) 01/31/2017 0035   Sepsis Labs Invalid input(s): PROCALCITONIN,  WBC,  LACTICIDVEN Microbiology Recent Results (from the past 240 hour(s))  Body fluid culture     Status: None   Collection Time: 02/02/17  4:08 PM  Result Value Ref Range Status   Specimen Description PLEURAL LEFT  Final   Special Requests NONE  Final   Gram Stain   Final    FEW WBC PRESENT,BOTH PMN AND MONONUCLEAR NO ORGANISMS SEEN    Culture   Final    NO GROWTH 3 DAYS Performed at [facility], [place] [address] [place], [place] [address]    Report Status 02/06/2017 FINAL  Final     Time coordinating discharge: Over 30 minutes  SIGNED:   Elmarie Shiley, MD  [facility] 02/09/2017, 3:45 PM Pager   If 7PM-7AM, please contact night-coverage [email or web address] Password [email or web address]

## 2017-02-09 NOTE — Progress Notes (Signed)
Pharmacy Antibiotic Note  Leon Sharp is a 83 y.o. male admitted on 01/29/2017 with aspiration pneumonia with large pleural effusion.  Pharmacy has been consulted for zosyn dosing.  S/p thoracentesis 8/13.  Today, 02/09/2017:  Day #12 Zosyn  WBC stable WNL  Afebrile since admission  Plan:  No dose adjustments to Zosyn to 3.375 g IV q8 hr by 4-hr infusion for CrCl>20 ml/min  Question length of therapy. Can antibiotics be discontinued since have completed a 10 day course?  Height: 5\' 10"  (177.8 cm) Weight: 160 lb (72.6 kg) IBW/kg (Calculated) : 73  Temp (24hrs), Avg:98 F (36.7 C), Min:97.7 F (36.5 C), Max:98.3 F (36.8 C)   Recent Labs Lab 02/03/17 0410 02/04/17 0413 02/05/17 0414 02/06/17 0948 02/07/17 0338 02/08/17 0417 02/09/17 0407  WBC 7.8 8.5 7.5 7.5  --   --   --   CREATININE 1.99* 1.99* 1.96* 2.11* 2.03* 2.12* 2.22*    Estimated Creatinine Clearance: 21.8 mL/min (A) (by C-G formula based on SCr of 2.22 mg/dL (H)).    No Known Allergies  Antimicrobials this admission: 8/9 zosyn >> 8/9 vanc x 1   Dose adjustments this admission:    Microbiology results: 8/9 BCx: ngf 8/13 pleural fluid cx ngtd 8/9 MRSA screen POS   Thank you for allowing pharmacy to be a part of this patient's care.  Hershal Coria, PharmD Pager: [phone] 02/09/2017

## 2017-02-09 NOTE — Progress Notes (Addendum)
Nurse called me from [facility] [place] [facility], gave report on patient status, [facility] contacted for transport with ETA of 1730, family aware, Saline lock to Left upper arm left intact per Hospice request

## 2017-02-09 NOTE — Progress Notes (Signed)
Nutrition Brief Note  Pt currently on comfort feedings. Spoke with family member who is concerned about getting higher calorie beverage items. RD placed order for [employer] TID in health touch. Pt to be d/c'd to hospice.   No nutrition interventions warranted at this time. If nutrition issues arise, please consult RD.   Mariana Single RD, LDN Clinical Nutrition Pager # [phone]

## 2017-02-09 NOTE — Progress Notes (Signed)
PROGRESS NOTE    Leon Sharp  TKW:[id number] DOB: [birth year] DOA: 01/29/2017 PCP: Jefm Petty, MD  Brief Narrative:Leon Sharp is a 83 year old male with multiple medical problems most notable for dementia, debility/wheelchair-bound, COPD, CAD with numerous stents, severe dysphagia, history of congestive heart failure, chronic kidney disease stage III was diagnosed with aspiration pneumonia last Friday and was started on Augmentin then. He's had extensive workup for dysphagia, his most recent modified barium swallow was 2 weeks ago, where it was recommended to place a PEG tube, which was declined by his daughter and patient continued on a pured diet with honey thick liquids. He also had an endoscopy a week ago, he was noted to have a mild esophageal stricture which was dilated then. He was brought to the emergency room today due to increased shortness of breath, Workup in the ED notable for mild leukocytosis, hyponatremia, creatinine of 2.2, normal lactate, chest x-ray with left lower lobe pneumonia and moderate left pleural effusion. Palliative following, slight improvement, plan for DC to SNF with palliative care FU  Assessment & Plan:  Aspiration pneumonia (HCC) with large L effusion -Long-standing history of severe dysphagia  -Admitted, started on IV Zosyn, and Repeat chest x-ray 8/11 with worsening large left likely parapneumonic effusion -Long-standing history of dysphagia, failed modified barium swallow 2 weeks ago, speech therapist following but were unable to evaluate him due to depressed mentation -Appreciate palliative consult -s/p Therapeutic thoracentesis 8-13 for Palliative purposes and 1.5L of blood tinged fluid was drained.  -Chest x ray with slightly increase moderate pleural effusion. Patient in no distress, oxygen sat 100 on 2 L. PRN chest x ray.  Received 10 days of IV zosyn.   Acute encephalopathy; Delirium; suspect related to general condition, infection,  electrolytes abnormalities, delirium.  Continue with IV fluids. Will stop fluids at discharge.  CT head, negative for acute stroke.  See note from Palliative care, no ABG<  Pleural effusion mildly increase in size.  He is not eating a lot, every time he try to eat he cough.   Severe dysphagia -As above, SLP following He was started on comfort feeding 8-14.  No significant oral intake, cough spell while eating.   Alzheimer's dementia -on Namenda   AKI on CKD3 with  hypernatremia -Baseline creatinine around 1.4-1.5. Holding Celebrex, -Continue with  IV fluids.  -cr stable today at 2.0 Change IV fluids to half NS>     DM -hold lantus.  -SSI   COPD with emphysema (HCC) -Stable, nebs when necessary    CAD in native artery -Stable, continue metoprolol, Plavix and statin  DVT prophylaxis: Heparin subcutaneous  Code Status: DO NOT RESUSCITATE  Family Communication:  daughter and son who were at bedside.  Disposition; awaiting hospice evaluation. Plan for residential hospice placement.    Consultants:   Palliative medicine  Antimicrobials:   IV Zosyn since 8/9  Procedures -Thoracentesis 8/13, 1.5 L of blood-tinged fluid drained   Subjective: He was alert this morning per family. On my evaluation he was more sleepy.  He is not eating much.  He cough while eating.    Objective: Vitals:   02/09/17 0317 02/09/17 0527 02/09/17 0850 02/09/17 1421  BP:  113/86    Pulse:  91    Resp:  20    Temp:  98 F (36.7 C)    TempSrc:  Oral    SpO2: 99% 100% 100% 98%  Weight:      Height:  Intake/Output Summary (Last 24 hours) at 02/09/17 1428 Last data filed at 02/09/17 0845  Gross per 24 hour  Intake           2302.5 ml  Output                0 ml  Net           2302.5 ml   Filed Weights   01/29/17 1304  Weight: 72.6 kg (160 lb)    Examination:  .Gen; cachetic. Sleepy  HEENT: Supple, No JVD Lungs: Bilateral air movement, bilateral crackles,  CVS:  S 1, S 2 RRR Abd: Soft, nt, nd Extremities: No cyanosis.  Skin: no new rashes  Data Reviewed:   CBC:  Recent Labs Lab 02/03/17 0410 02/04/17 0413 02/05/17 0414 02/06/17 0948  WBC 7.8 8.5 7.5 7.5  HGB 8.9* 9.0* 8.8* 9.4*  HCT 28.8* 28.0* 28.3* 30.3*  MCV 99.0 100.0 100.7* 98.7  PLT 167 148* 156 831   Basic Metabolic Panel:  Recent Labs Lab 02/05/17 0414 02/06/17 0948 02/07/17 0338 02/08/17 0417 02/09/17 0407  NA 149* 147* 144 141 141  K 3.4* 3.9 3.2* 3.9 3.3*  CL 110 107 106 104 104  CO2 33* 30 30 29 26   GLUCOSE 147* 180* 250* 265* 259*  BUN 28* 26* 26* 28* 30*  CREATININE 1.96* 2.11* 2.03* 2.12* 2.22*  CALCIUM 8.1* 8.3* 7.9* 7.8* 7.9*   GFR: Estimated Creatinine Clearance: 21.8 mL/min (A) (by C-G formula based on SCr of 2.22 mg/dL (H)). Liver Function Tests: No results for input(s): AST, ALT, ALKPHOS, BILITOT, PROT, ALBUMIN in the last 168 hours. No results for input(s): LIPASE, AMYLASE in the last 168 hours. No results for input(s): AMMONIA in the last 168 hours. Coagulation Profile: No results for input(s): INR, PROTIME in the last 168 hours. Cardiac Enzymes: No results for input(s): CKTOTAL, CKMB, CKMBINDEX, TROPONINI in the last 168 hours. BNP (last 3 results) No results for input(s): PROBNP in the last 8760 hours. HbA1C: No results for input(s): HGBA1C in the last 72 hours. CBG:  Recent Labs Lab 02/08/17 1206 02/08/17 1641 02/08/17 2213 02/09/17 0820 02/09/17 1143  GLUCAP 183* 229* 182* 244* 232*   Lipid Profile: No results for input(s): CHOL, HDL, LDLCALC, TRIG, CHOLHDL, LDLDIRECT in the last 72 hours. Thyroid Function Tests:  Recent Labs  02/07/17 0338  TSH 1.427   Anemia Panel:  Recent Labs  02/07/17 0338  VITAMINB12 753   Urine analysis:    Component Value Date/Time   COLORURINE YELLOW 01/31/2017 0035   APPEARANCEUR CLOUDY (A) 01/31/2017 0035   LABSPEC 1.014 01/31/2017 0035   PHURINE 5.0 01/31/2017 0035   GLUCOSEU  NEGATIVE 01/31/2017 0035   HGBUR LARGE (A) 01/31/2017 0035   BILIRUBINUR NEGATIVE 01/31/2017 0035   KETONESUR NEGATIVE 01/31/2017 0035   PROTEINUR 30 (A) 01/31/2017 0035   UROBILINOGEN 1.0 10/25/2013 2305   NITRITE NEGATIVE 01/31/2017 0035   LEUKOCYTESUR SMALL (A) 01/31/2017 0035   Sepsis Labs: @LABRCNTIP (procalcitonin:4,lacticidven:4)  ) Recent Results (from the past 240 hour(s))  Body fluid culture     Status: None   Collection Time: 02/02/17  4:08 PM  Result Value Ref Range Status   Specimen Description PLEURAL LEFT  Final   Special Requests NONE  Final   Gram Stain   Final    FEW WBC PRESENT,BOTH PMN AND MONONUCLEAR NO ORGANISMS SEEN    Culture   Final    NO GROWTH 3 DAYS Performed at [facility], [place]  [address] [place], [place] [address]    Report Status 02/06/2017 FINAL  Final         Radiology Studies: No results found.      Scheduled Meds: . donepezil  5 mg Oral QHS  . DULoxetine  20 mg Oral Q breakfast  . feeding supplement  1 Container Oral TID WC  . fludrocortisone  0.1 mg Oral Q breakfast  . insulin aspart  0-9 Units Subcutaneous TID WC  . ipratropium-albuterol  3 mL Nebulization Q6H  . memantine  28 mg Oral Q breakfast  . nystatin  5 mL Oral TID WC & HS  . pantoprazole  40 mg Oral BID  . polyvinyl alcohol  1 drop Both Eyes TID WC   Continuous Infusions: . sodium chloride 75 mL/hr at 02/09/17 0526     LOS: 11 days    Time spent: 35  min    Niel Hummer, MD [facility] Pager [phone]  If 7PM-7AM, please contact night-coverage [email or web address] Password [email or web address] 02/09/2017, 2:28 PM

## 2017-02-09 NOTE — Care Management Note (Addendum)
Case Management Note  Patient Details  Name: Leon Sharp MRN: [id number] Date of Birth: Nov 05, 1921  Subjective/Objective:                    Action/Plan:   Expected Discharge Date:   (UNKNOWN)               Expected Discharge Plan:  Residential Hospice   In-House Referral:  Clinical Social Work  Discharge planning Services  CM Consult  Post Acute Care Choice:    Choice offered to:     DME Arranged:    DME Agency:     HH Arranged:    [place] Agency:     Status of Service:  In process, will continue to follow  If discussed at Long Length of Stay Meetings, dates discussed:    Additional CommentsPurcell Mouton, RN 02/09/2017, 12:27 PM

## 2017-02-21 DEATH — deceased

## 2019-02-20 IMAGING — DX DG CHEST 2V
2 series · 2 of 2 positions shown · non-contrast
Comparison: 01/29/2017

CLINICAL DATA: Shortness of breath, weakness, altered mental
status, history of skin cancer, coronary artery disease post MI,
Parkinson's dementia, hypertension, diabetes mellitus, COPD

EXAM:
CHEST  2 VIEW

[chest lat]
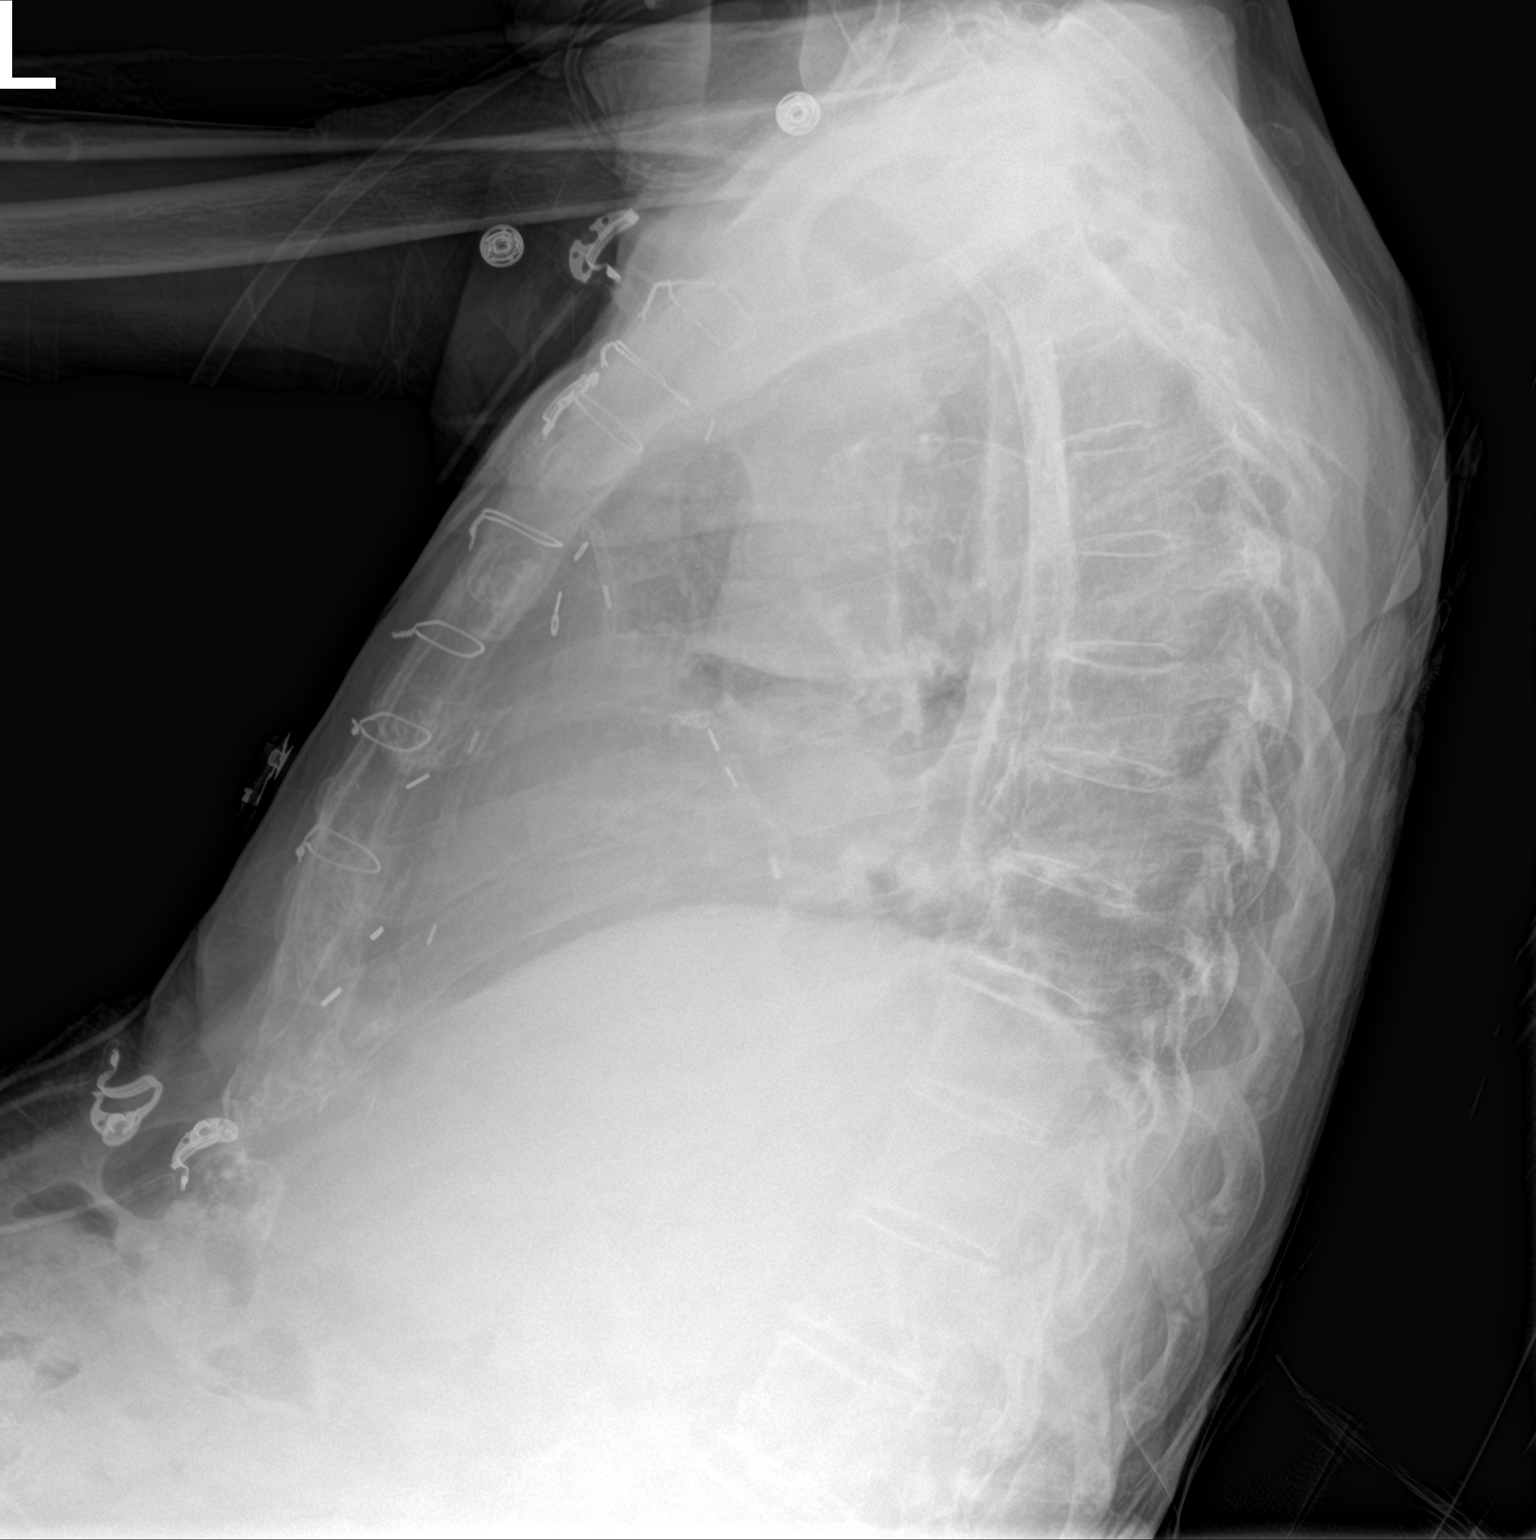

[chest ap]
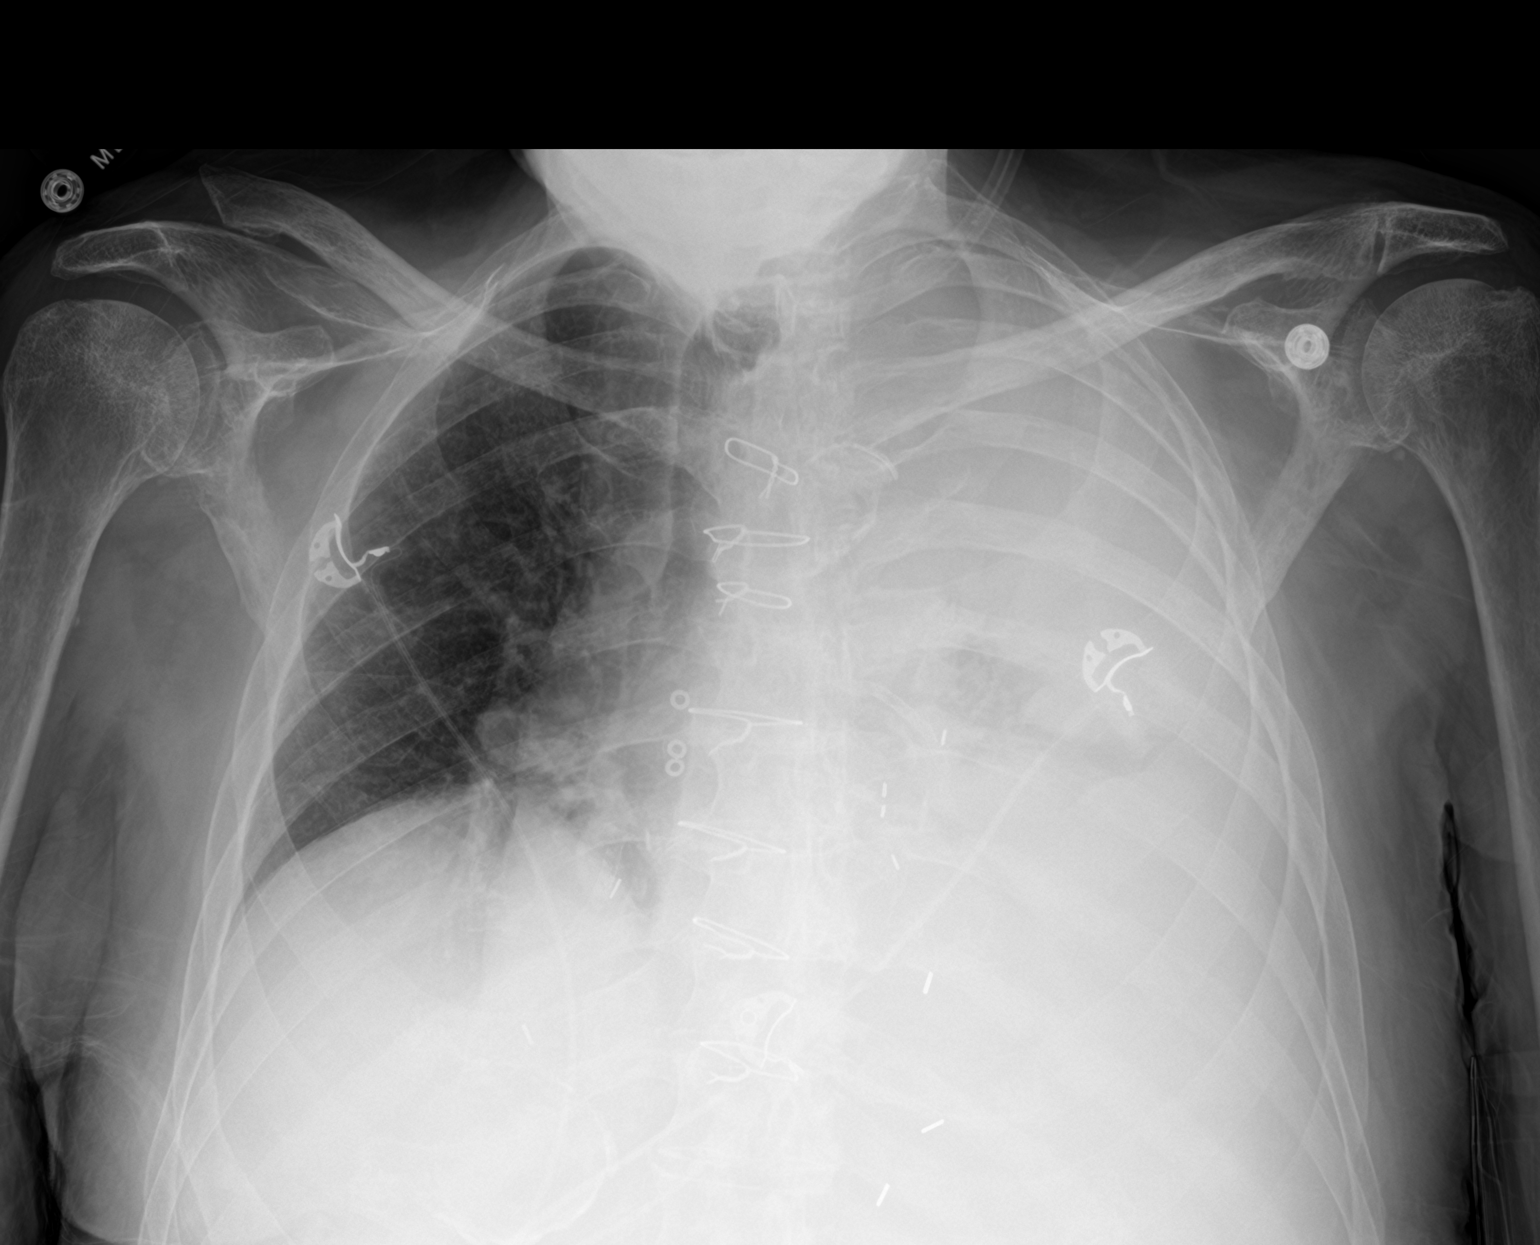

[2 of 2 positions shown; findings below may reference images not displayed]

FINDINGS: Normal heart size post CABG and coronary stenting.

Atherosclerotic calcification aorta.

Subtotal opacification of LEFT hemithorax by increased LEFT pleural
effusion.

Subtotal atelectasis of LEFT lung.

Mild RIGHT basilar atelectasis.

No pulmonary infiltrate or pneumothorax.

Diffuse osseous demineralization.
IMPRESSION: Increased LEFT pleural effusion with subtotal atelectasis of LEFT
lung.

Mild RIGHT basilar atelectasis.

Post CABG and coronary stenting.

Aortic Atherosclerosis ([id number]).

## 2019-02-24 IMAGING — CT CT HEAD W/O CM
3 series · 16 of 47 positions shown, 19 images · non-contrast
Comparison: Head CT 06/30/2016

CLINICAL DATA: Altered mental status

EXAM:
CT HEAD WITHOUT CONTRAST
TECHNIQUE: Contiguous axial images were obtained from the base of the skull
through the vertex without intravenous contrast.

[Series 2: head wo · axial · 0.39mm/px · z∈[+1547,+1692]mm · 10 of 35 slices shown, 13 images]
[im 3/35  brain]
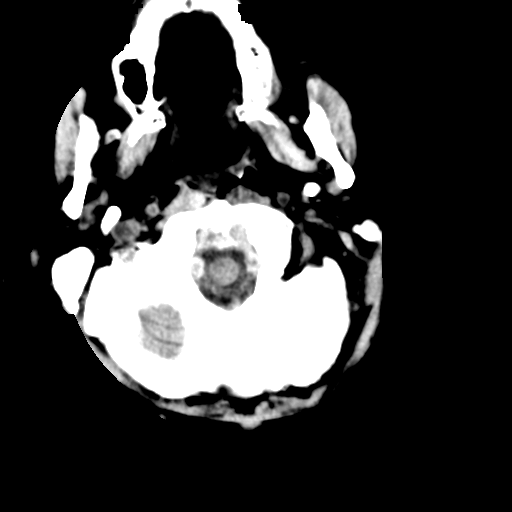
[im 3/35  bone]
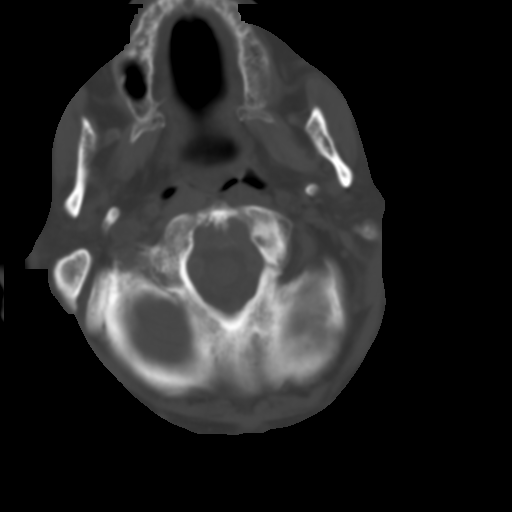
[im 6/35  brain]
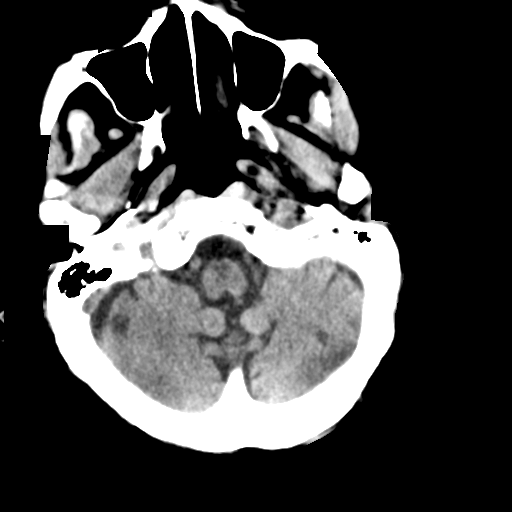
[im 10/35  brain]
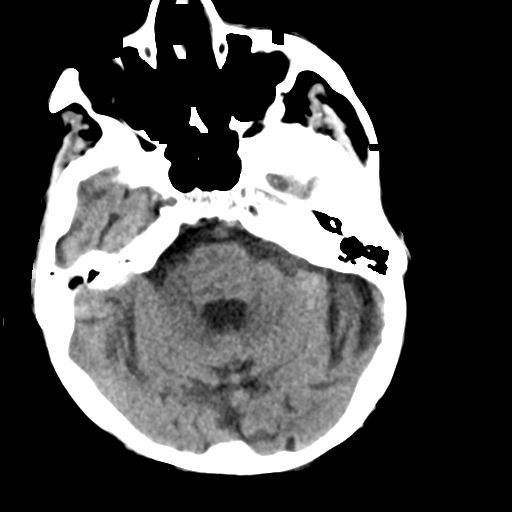
[im 12/35  brain]
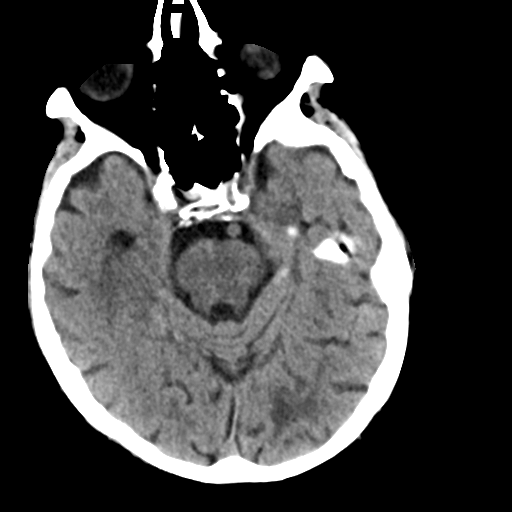
[im 16/35  brain]
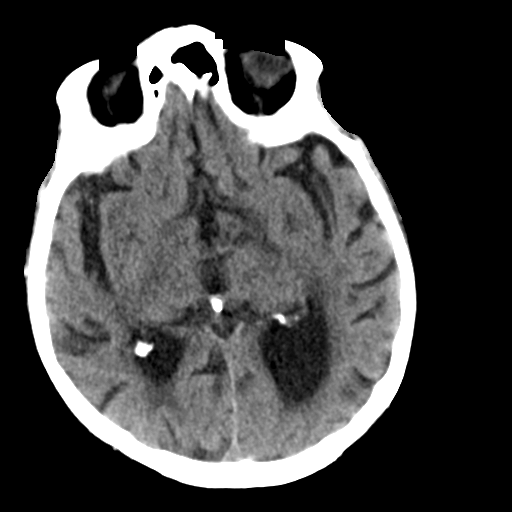
[im 16/35  bone]
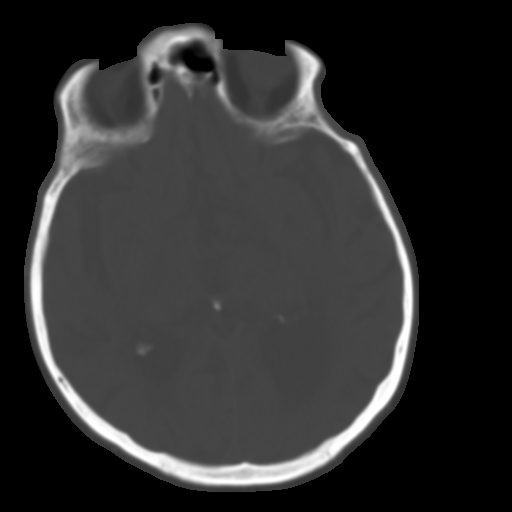
[im 19/35  brain]
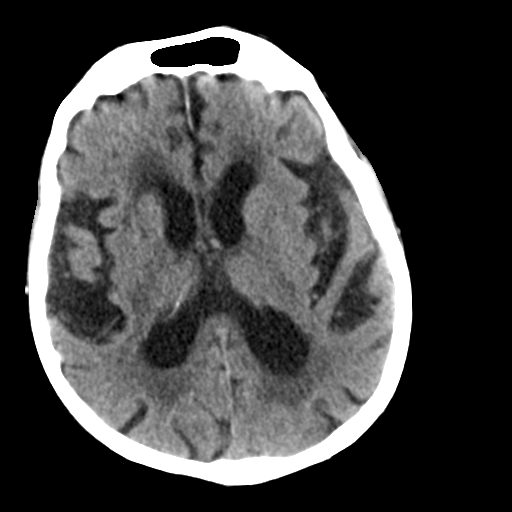
[im 23/35  brain]
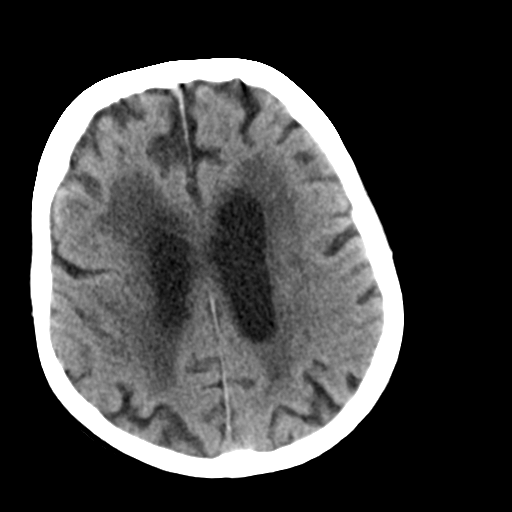
[im 26/35  brain]
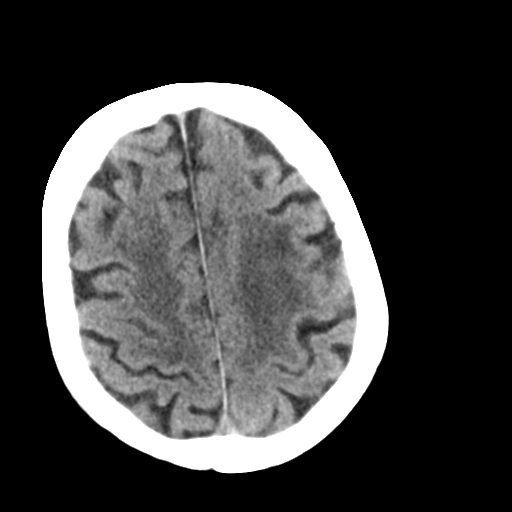
[im 29/35  brain]
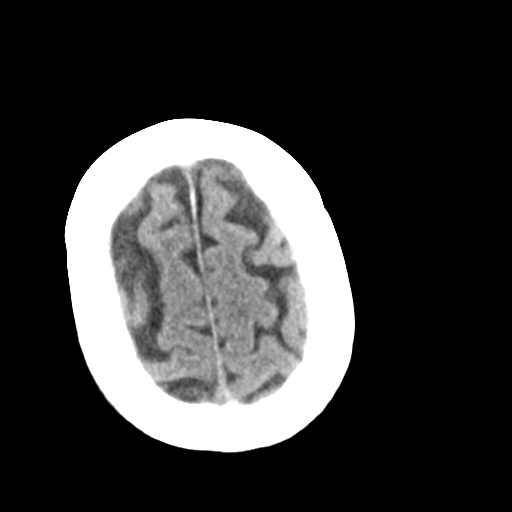
[im 29/35  bone]
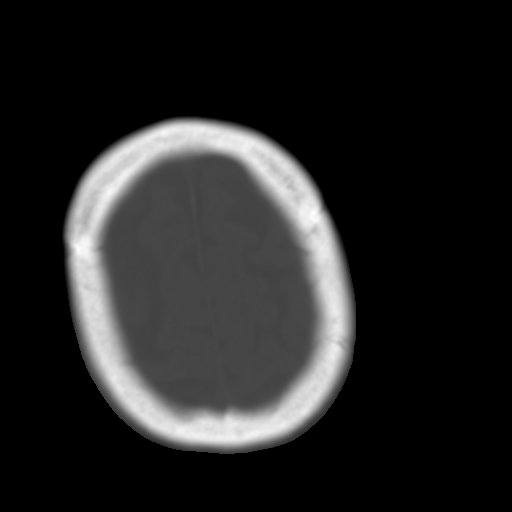
[im 32/35  brain]
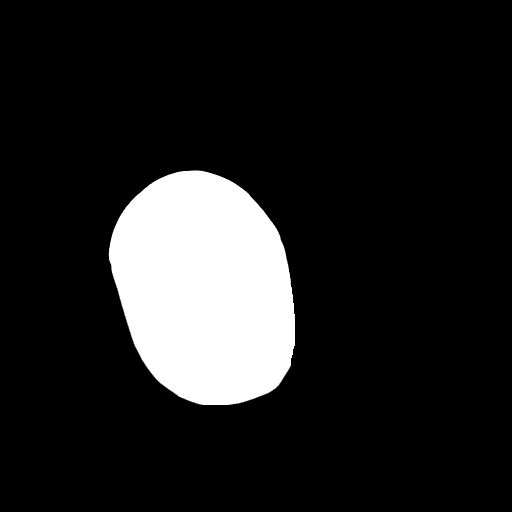

[Series 4: coronal soft tissue · coronal · 0.31mm/px · 3 of 67 slices shown]
[im 23/67  brain]
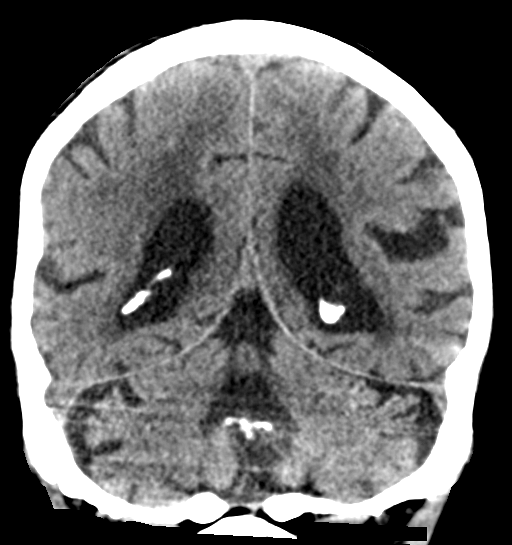
[im 30/67  brain]
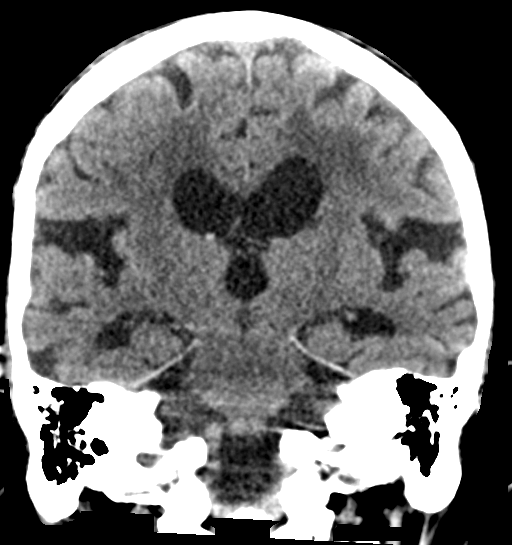
[im 37/67  brain]
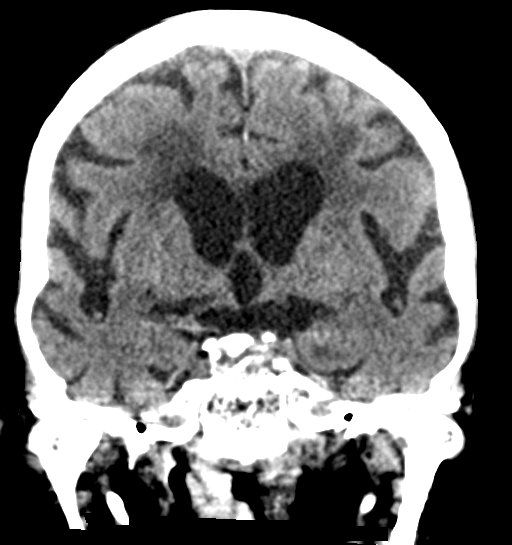

[Series 5: sagittal soft tissue · sagittal · 0.32mm/px · 3 of 54 slices shown]
[im 18/54  brain]
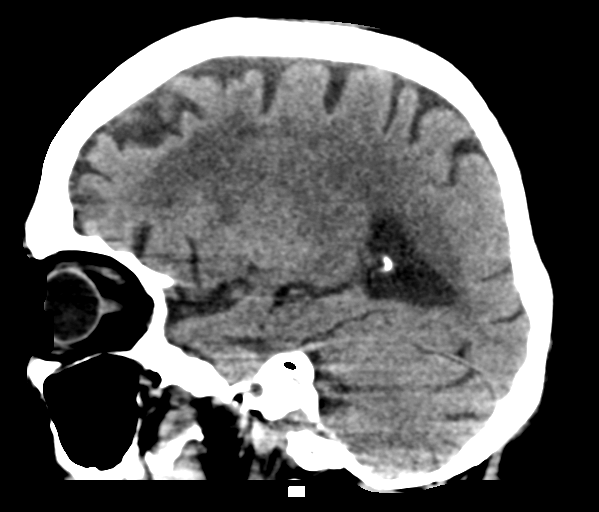
[im 27/54  brain]
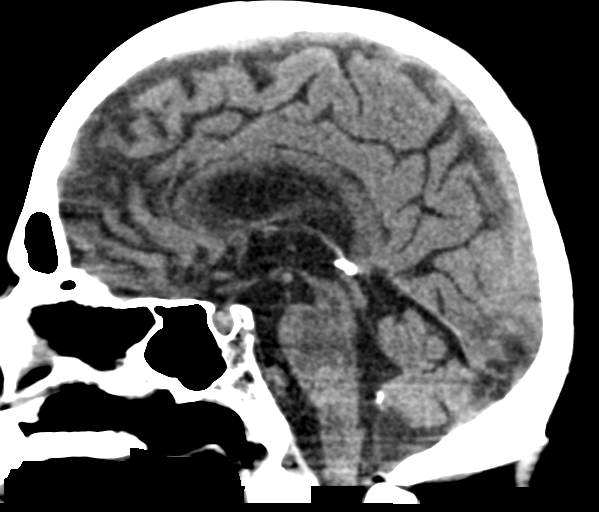
[im 36/54  brain]
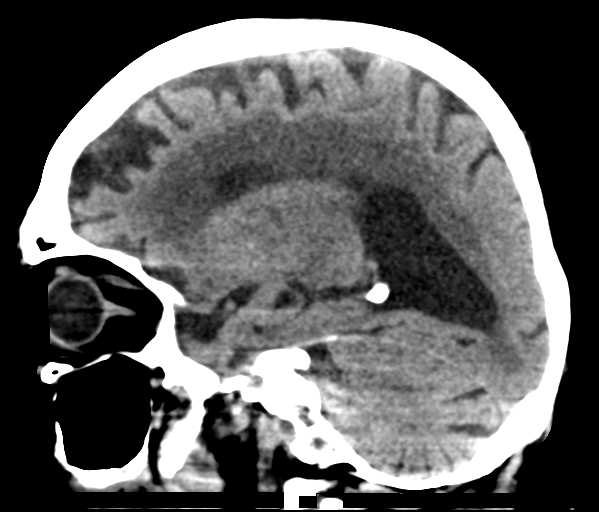

[16 of 47 positions shown; findings below may reference images not displayed]

FINDINGS: Brain: No mass lesion, intraparenchymal hemorrhage or extra-axial
collection. No evidence of acute cortical infarct. There is
periventricular hypoattenuation compatible with chronic
microvascular disease. Old bilateral cerebellar infarcts. There is
global atrophy, unchanged.

Vascular: No hyperdense vessel sign. Atherosclerotic calcification
of the vertebral and internal carotid arteries at the skull base.

Skull: Normal visualized skull base, calvarium and extracranial soft
tissues.

Sinuses/Orbits: No sinus fluid levels or advanced mucosal
thickening. No mastoid effusion. Normal orbits.
IMPRESSION: Old bilateral cerebellar infarcts and chronic microvascular ischemia
without acute abnormality. Unchanged global atrophy.
